# Patient Record
Sex: Male | Born: 1940 | Race: Asian | Hispanic: No | Marital: Married | Smoking: Never smoker
Health system: Southern US, Community
[De-identification: ages and names within clinical notes are randomized; demographics above are authoritative.]

## PROBLEM LIST (undated history)

## (undated) DIAGNOSIS — E785 Hyperlipidemia, unspecified: Secondary | ICD-10-CM

## (undated) DIAGNOSIS — I10 Essential (primary) hypertension: Secondary | ICD-10-CM

## (undated) DIAGNOSIS — M1990 Unspecified osteoarthritis, unspecified site: Secondary | ICD-10-CM

## (undated) DIAGNOSIS — I2510 Atherosclerotic heart disease of native coronary artery without angina pectoris: Secondary | ICD-10-CM

## (undated) HISTORY — PX: CARDIAC CATHETERIZATION: SHX172

## (undated) HISTORY — PX: CORONARY ARTERY BYPASS GRAFT: SHX141

---

## 1996-10-02 HISTORY — PX: OTHER SURGICAL HISTORY: SHX169

## 2005-01-27 ENCOUNTER — Inpatient Hospital Stay (HOSPITAL_COMMUNITY): Admission: EM | Admit: 2005-01-27 | Discharge: 2005-01-31 | Payer: Self-pay

## 2005-06-02 ENCOUNTER — Inpatient Hospital Stay (HOSPITAL_COMMUNITY): Admission: AD | Admit: 2005-06-02 | Discharge: 2005-06-13 | Payer: Self-pay | Admitting: Cardiovascular Disease

## 2005-06-07 ENCOUNTER — Encounter (INDEPENDENT_AMBULATORY_CARE_PROVIDER_SITE_OTHER): Payer: Self-pay | Admitting: Cardiovascular Disease

## 2005-06-26 ENCOUNTER — Encounter: Admission: RE | Admit: 2005-06-26 | Discharge: 2005-06-26 | Payer: Self-pay | Admitting: Cardiovascular Disease

## 2005-07-07 ENCOUNTER — Encounter: Admission: RE | Admit: 2005-07-07 | Discharge: 2005-07-07 | Payer: Self-pay | Admitting: Cardiothoracic Surgery

## 2006-01-12 ENCOUNTER — Ambulatory Visit (HOSPITAL_COMMUNITY): Admission: RE | Admit: 2006-01-12 | Discharge: 2006-01-12 | Payer: Self-pay | Admitting: Orthopedic Surgery

## 2006-08-07 ENCOUNTER — Encounter (HOSPITAL_COMMUNITY): Admission: RE | Admit: 2006-08-07 | Discharge: 2006-08-07 | Payer: Self-pay | Admitting: Cardiovascular Disease

## 2007-06-11 ENCOUNTER — Ambulatory Visit: Payer: Self-pay | Admitting: Vascular Surgery

## 2007-06-11 ENCOUNTER — Encounter (INDEPENDENT_AMBULATORY_CARE_PROVIDER_SITE_OTHER): Payer: Self-pay | Admitting: Endocrinology

## 2007-06-11 ENCOUNTER — Ambulatory Visit (HOSPITAL_COMMUNITY): Admission: RE | Admit: 2007-06-11 | Discharge: 2007-06-11 | Payer: Self-pay | Admitting: Endocrinology

## 2007-07-18 ENCOUNTER — Encounter (INDEPENDENT_AMBULATORY_CARE_PROVIDER_SITE_OTHER): Payer: Self-pay | Admitting: Cardiovascular Disease

## 2007-07-18 ENCOUNTER — Ambulatory Visit (HOSPITAL_COMMUNITY): Admission: RE | Admit: 2007-07-18 | Discharge: 2007-07-18 | Payer: Self-pay | Admitting: Cardiovascular Disease

## 2007-07-18 ENCOUNTER — Ambulatory Visit: Payer: Self-pay | Admitting: Surgery

## 2008-11-26 ENCOUNTER — Encounter: Admission: RE | Admit: 2008-11-26 | Discharge: 2008-11-26 | Payer: Self-pay | Admitting: Cardiovascular Disease

## 2008-11-30 ENCOUNTER — Encounter: Admission: RE | Admit: 2008-11-30 | Discharge: 2008-11-30 | Payer: Self-pay | Admitting: Cardiovascular Disease

## 2008-12-18 ENCOUNTER — Encounter: Admission: RE | Admit: 2008-12-18 | Discharge: 2008-12-18 | Payer: Self-pay | Admitting: Cardiovascular Disease

## 2009-01-13 ENCOUNTER — Inpatient Hospital Stay (HOSPITAL_COMMUNITY): Admission: AD | Admit: 2009-01-13 | Discharge: 2009-01-14 | Payer: Self-pay | Admitting: Cardiovascular Disease

## 2009-04-26 ENCOUNTER — Encounter: Admission: RE | Admit: 2009-04-26 | Discharge: 2009-04-28 | Payer: Self-pay | Admitting: Neurosurgery

## 2009-09-09 ENCOUNTER — Encounter (INDEPENDENT_AMBULATORY_CARE_PROVIDER_SITE_OTHER): Payer: Self-pay | Admitting: Sports Medicine

## 2009-09-09 ENCOUNTER — Ambulatory Visit (HOSPITAL_COMMUNITY): Admission: RE | Admit: 2009-09-09 | Discharge: 2009-09-09 | Payer: Self-pay | Admitting: Sports Medicine

## 2009-09-09 ENCOUNTER — Ambulatory Visit: Payer: Self-pay | Admitting: Vascular Surgery

## 2010-07-27 ENCOUNTER — Ambulatory Visit: Payer: Self-pay | Admitting: Vascular Surgery

## 2010-07-27 ENCOUNTER — Ambulatory Visit (HOSPITAL_COMMUNITY): Admission: RE | Admit: 2010-07-27 | Discharge: 2010-07-27 | Payer: Self-pay | Admitting: Cardiovascular Disease

## 2010-07-27 ENCOUNTER — Encounter (INDEPENDENT_AMBULATORY_CARE_PROVIDER_SITE_OTHER): Payer: Self-pay | Admitting: Cardiovascular Disease

## 2011-01-11 LAB — COMPREHENSIVE METABOLIC PANEL
ALT: 29 U/L (ref 0–53)
AST: 25 U/L (ref 0–37)
Albumin: 3.9 g/dL (ref 3.5–5.2)
Alkaline Phosphatase: 56 U/L (ref 39–117)
BUN: 14 mg/dL (ref 6–23)
CO2: 27 mEq/L (ref 19–32)
Calcium: 9.5 mg/dL (ref 8.4–10.5)
Chloride: 102 mEq/L (ref 96–112)
Creatinine, Ser: 1.1 mg/dL (ref 0.4–1.5)
GFR calc Af Amer: >60 mL/min (ref >60)
GFR calc non Af Amer: >60 mL/min (ref >60)
Glucose, Bld: 174 mg/dL — ABNORMAL HIGH (ref 70–99)
Potassium: 3.7 mEq/L (ref 3.5–5.1)
Sodium: 136 mEq/L (ref 135–145)
Total Bilirubin: 0.6 mg/dL (ref 0.3–1.2)
Total Protein: 6.8 g/dL (ref 6.0–8.3)

## 2011-01-11 LAB — DIFFERENTIAL
Basophils Absolute: 0 10*3/uL (ref 0.0–0.1)
Basophils Relative: 0 % (ref 0–1)
Eosinophils Absolute: 0.8 10*3/uL — ABNORMAL HIGH (ref 0.0–0.7)
Eosinophils Relative: 10 % — ABNORMAL HIGH (ref 0–5)
Lymphocytes Relative: 32 % (ref 12–46)
Lymphs Abs: 2.4 10*3/uL (ref 0.7–4.0)
Monocytes Absolute: 0.6 10*3/uL (ref 0.1–1.0)
Monocytes Relative: 8 % (ref 3–12)
Neutro Abs: 3.8 10*3/uL (ref 1.7–7.7)
Neutrophils Relative %: 50 % (ref 43–77)

## 2011-01-11 LAB — GLUCOSE, CAPILLARY
Glucose-Capillary: 143 mg/dL — ABNORMAL HIGH (ref 70–99)
Glucose-Capillary: 170 mg/dL — ABNORMAL HIGH (ref 70–99)
Glucose-Capillary: 201 mg/dL — ABNORMAL HIGH (ref 70–99)
Glucose-Capillary: 217 mg/dL — ABNORMAL HIGH (ref 70–99)

## 2011-01-11 LAB — CBC
HCT: 39.2 % (ref 39.0–52.0)
HCT: 40.3 % (ref 39.0–52.0)
Hemoglobin: 13.4 g/dL (ref 13.0–17.0)
Hemoglobin: 13.8 g/dL (ref 13.0–17.0)
MCHC: 34.2 g/dL (ref 30.0–36.0)
MCV: 83 fL (ref 78.0–100.0)
MCV: 83.3 fL (ref 78.0–100.0)
Platelets: 238 10*3/uL (ref 150–400)
Platelets: 251 10*3/uL (ref 150–400)
RBC: 4.85 MIL/uL (ref 4.22–5.81)
RDW: 14.9 % (ref 11.5–15.5)
RDW: 14.9 % (ref 11.5–15.5)
WBC: 7.6 10*3/uL (ref 4.0–10.5)

## 2011-01-11 LAB — BASIC METABOLIC PANEL
BUN: 15 mg/dL (ref 6–23)
CO2: 28 mEq/L (ref 19–32)
Chloride: 106 mEq/L (ref 96–112)
GFR calc non Af Amer: >60 mL/min (ref >60)
Glucose, Bld: 106 mg/dL — ABNORMAL HIGH (ref 70–99)
Potassium: 4.1 mEq/L (ref 3.5–5.1)
Sodium: 140 mEq/L (ref 135–145)

## 2011-02-14 NOTE — Consult Note (Signed)
NAME:  TRACIE, LINDBLOOM NO.:  [id number]   MEDICAL RECORD NO.:  [id number]          PATIENT TYPE:  INP   LOCATION:  4705                         FACILITY:  [facility]   PHYSICIAN:  Coletta Memos, M.D.     DATE OF BIRTH:  04-Jan-1941   DATE OF CONSULTATION:  01/14/2009  DATE OF DISCHARGE:  01/14/2009                                 CONSULTATION   ADMITTING DIAGNOSIS:  Possible stroke.   INDICATIONS:  Mr. Weyenberg is a 70 year old gentleman who is in his usual  state of good health when he described having numbness that went across  his face, mouth, neck, shoulder, arm, and forearm on the right side.  He  was seen and evaluated in the emergency room.  It was thought, he may  have had a stroke.  He was therefore admitted and had a stroke  evaluation.  Brain MRIs and CTs have been negative.  He did have a  cervical spine MRI, which showed spondylitic change.  He was seen and  evaluated by Neurology and they felt that maybe he had a possible ulnar  neuropathy or cervical radiculopathy.  He has had no bowel or bladder  dysfunction.  No gait difficulties.  No weakness, nor as he had pain in  his upper extremities.  Mr. Laventure had had previous surgery on his  carotid artery on the right side, but that has been inspected and is  patent.  He otherwise had no other neurologic problems.  He does report  having a pain in his neck.   ALLERGIES:  He has no known drug allergies.   PAST MEDICAL HISTORY:  1. Coronary artery disease.  2. Unstable angina.  3. Type 2 diabetes.  4. Hypertension.  5. Hyperlipidemia.  6. He has had coronary artery bypass graft, which was performed in      2006.   CURRENT MEDICATIONS:  Aspirin, atenolol, calcium carbonate, insulin,  multivitamin, Benicar, and Crestor.   SOCIAL HISTORY:  He has also had angioplasty in the past.  He does not  smoke.  He does not use alcohol.  Does not use illicit drugs.  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  He is married.  He has  one son and one daughter.   FAMILY HISTORY:  Positive for diabetes and myocardial infarction.   PHYSICAL EXAMINATION:  VITAL SIGNS:  He weighs 78.9 kg, height 6 feet 5  inches.  Temperature of 98, pulse 73, respiratory rate 18, and blood  pressure 118/68.  GENERAL:  He is alert and oriented x4 and answering all questions  appropriately.  NEUROLOGIC:  Memory, language, attention span, and fund of knowledge are  normal.  Speech is clear and fluent.  Strength is 5/5 in the upper and  lower extremities.  He has a normal gait.  Negative Romberg test.  Coordination is normal.  Negative Tinel sign over decubital tunnel and  over the carpal tunnel bilaterally.  Reflexes 2+.  Normal muscle, tone,  bulk, and coordination.  Gait is normal.  Hearing intact to finger rub  bilaterally.   Cervical spine MRI shows significant spondylitic disease.  He has facet  arthropathy at C5-6, C6-7, and on the right side foraminal narrowing, on  the left side only at C6-7.  Cord signal is normal.  He has no cord  compression at any level.  Foraminal narrowing present at C5-6 and some  at C6-7.   DIAGNOSES:  Numbness across face and arm.   On examination, proprioception is normal and light touch is normal in  the face and all distributions of the trigeminal nerve.  I am not sure,  which causing the numbness, but certainly does not need an operation.  He does not have a radicular history nor does he have radicular  problems.  Mr. Raineri can certainly be discharged from my standpoint.  He does not need follow up as a result of his neck, only if he has  increasing problems.  I told Dr. [name] to try some anti-  inflammatories, will see how he does, but he will be discharged today.           ______________________________  Coletta Memos, M.D.     KC/MEDQ  D:  01/14/2009  T:  01/15/2009  Job:  [id number]

## 2011-02-14 NOTE — Discharge Summary (Signed)
NAME:  FERDINANDO, LODGE NO.:  [id number]   MEDICAL RECORD NO.:  [id number]          PATIENT TYPE:  INP   LOCATION:  4705                         FACILITY:  [facility]   PHYSICIAN:  Ricki Rodriguez, M.D.  DATE OF BIRTH:  1941/08/18   DATE OF ADMISSION:  01/13/2009  DATE OF DISCHARGE:  01/14/2009                               DISCHARGE SUMMARY   FINAL DIAGNOSES:  1. Cervical disk disease with radiculopathy.  2. Diabetes mellitus type 2.  3. Hypertension.  4. Coronary arthrosclerosis of native coronary vessels.  5. Aortocoronary artery bypass status.  6. Hyperlipidemia.  7. Long-term use of aspirin.   DISCHARGE MEDICATIONS:  1. Aspirin 81 mg one daily.  2. Plavix 75 mg one daily.  3. Lopressor 50 mg twice daily.  4. Vytorin 10/40 mg one in the evening.  5. Multivitamin one daily.  6. Actoplus Met 15/500 mg one twice daily.  7. Calcium 600 mg daily.  8. NovoLog 70/30 - 25 units in the morning and 50 units in the      evening.  9. Diovan 160 mg daily.  10.Lasix 40 mg daily.  11.K-Dur 20 mEq daily.  12.Diclofenac 50 mg one twice daily for 2 to 4 weeks.   DISCHARGE DIET:  Low-sodium, heart-healthy diet, low-calorie  carbohydrate modified diet.   DISCHARGE ACTIVITY:  The patient to increase activity slowly.   FOLLOWUP:  Follow up by Dr. Orpah Cobb in 1 week.  The patient to call  at [phone] for appointment.   HISTORY:  This 70 year old Asian-American male complained of right hand  and facial numbness x 1 month.  The patient denied any chest pain,  fever, nausea, and vomiting.  He has history of chronic neck pain,  occasional headaches without visual disturbance.   PHYSICAL EXAMINATION:  VITAL SIGNS:  Pulse 74, respirations 14, blood  pressure 180/80, height 5 feet 5 inches, and weight 180 pounds.  HEENT:  The patient is normocephalic and atraumatic with frontal  baldness, brown eyes, and bilateral lens implant.  NECK:  No JVD with 50% range of motion.  LUNGS:  Clear.  HEART:  Normal S1 and S2.  EXTREMITIES:  No edema.  CNS:  Cranial nerves grossly intact.  Bilateral equal grips.  Right  upper extremity numbness.   LABORATORY DATA:  Normal hemoglobin, hematocrit, WBC count, and platelet  count.  Normal electrolytes, BUN, creatinine, glucose 174.  Subsequent  glucose ranged between 106 and 217.  MRI and MRA of the head showed no  evidence of occlusion or correctable stenosis of the cerebral vessels.  No acute or subacute infarction.  X-ray of the chest showed stable  postoperative chest with chronic right middle lobe scarring.   HOSPITAL COURSE:  The patient was admitted to telemetry unit.  He had  Neurosurgical consult for a cervical disk disease and radiculopathy.  The patient was started on NSAID and outpatient followup was arranged  for his further treatment of cervical disk disease with radiculopathy.  The patient was discharged home in satisfactory condition with follow up  by me and by Neurosurgeon as arranged.  Ricki Rodriguez, M.D.  Electronically Signed     ASK/MEDQ  D:  03/18/2009  T:  03/19/2009  Job:  [id number]

## 2011-02-14 NOTE — Consult Note (Signed)
NAME:  Calvin Perez, Calvin Perez NO.:  [id number]   MEDICAL RECORD NO.:  [id number]          PATIENT TYPE:  INP   LOCATION:  4705                         FACILITY:  [facility]   PHYSICIAN:  Levert Feinstein, MD          DATE OF BIRTH:  [birth year]   DATE OF CONSULTATION:  DATE OF DISCHARGE:  01/14/2009                                 CONSULTATION   REFERRING PHYSICIAN:  Ricki Rodriguez, M.D.   CHIEF COMPLAINT:  This is an urgent consult from Dr. Orpah Cobb for  right facial, arm numbness.   HISTORY OF PRESENT ILLNESS:  The patient is a 70 year old right-handed  [place] male, with past medical history of coronary artery disease status  post CABG in September 2006, type 2 diabetes, hypertension,  hyperlipidemia, right carotid endarterectomy in the past.  He presents  with couple of months history of right fifth finger numbness,  intermittent, gradually spreading to the right medial forearm, to the  armpit area, still intermittent, recent intermittent involvement of  right neck, jaw, and the right lip.   He denied neck pain otherwise, and denied dysarthria, dysphagia, or limb  weakness.   He was admitted for further evaluation.  CT of the head without contrast  demonstrated no acute change, but there was periventricular white matter  disease.  MRI of the spine was done on March 19 this year, which has  demonstrated a multilevel degenerative disk disease, with mild canal  stenosis at C3-4, C4-5 level.  There was also C6 and C7 spondylosis,  right posterolateral disk herniation, and foraminal stenosis  bilaterally, worse on the right side.   REVIEW OF SYSTEMS:  Pertinent as above.   PAST MEDICAL HISTORY:  1. Coronary artery disease.  2. Type 2 diabetes.  3. Hypertension.  4. Hyperlipidemia.  5. Right endarterectomy.   FAMILY HISTORY:  Noncontributory.   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   CURRENT MEDICATIONS:  Aspirin, atenolol, insulin,  multivitamin, Benicar,  and Crestor.   ALLERGY:  No known drug allergy.   PHYSICAL EXAMINATION:  VITAL SIGNS:  Temperature 97.9, blood pressure  125/64, heart rate of 78, and respiration of 20.  CARDIAC:  Regular rate and rhythm.  PULMONARY:  Clear to auscultation bilaterally.  NECK:  Supple.  No carotid bruits.  NEUROLOGIC:  Awake the patient from sleep, he is slow to response, but  there was no dysarthria and no aphasia.   Cranial nerves II through XII were normal.  Motor examination, Normal  tone, bulk, and strength.  Sensory, normal to light touch, pinprick,  vibratory sensation.  Deep tendon reflex present and symmetric.  Plantar  responses were flexor.  Coordination; normal finger-to-nose, heel-to-  shin.   Gait was deferred.   ASSESSMENT:  A 70 year old gentleman, presenting with constellation of  complaints, certainly has multiple vascular risk factors, including  hypertension, hyperlipidemia, diabetes, and coronary artery disease.  The intermittent, prolonged history, less suggestive of stroke, however  with a facial involvement, need to rule out central nervous system  etiology.   PLAN:  1.  MRI of the brain without contrast.  2. The other potential differentiation diagnosis including mild right      ulnar neuropathy, versus cervical radiculopathy, can complete rest      evaluation as an outpatient, please followup with clinic one month      postdischarge.      Levert Feinstein, MD  Electronically Signed     YY/MEDQ  D:  01/14/2009  T:  01/14/2009  Job:  [id number]

## 2011-02-17 NOTE — Cardiovascular Report (Signed)
Calvin Perez, Calvin Perez NO.:  [id number]   MEDICAL RECORD NO.:  [id number]          PATIENT TYPE:  OIB   LOCATION:  2899                         FACILITY:  [facility]   PHYSICIAN:  Ricki Rodriguez, M.D.  DATE OF BIRTH:  September 25, 1941   DATE OF PROCEDURE:  06/02/2005  DATE OF DISCHARGE:                              CARDIAC CATHETERIZATION   PROCEDURE:  Left heart catheterization, selective coronary angiography, left  ventricular function study.   INDICATIONS:  This is a 70 year old diabetic, hypertensive, Asian-American  with recurrent chest pain along with coronary artery disease, stent  placement in LAD, and history of hyperlipidemia.   APPROACH:  Right femoral artery using 4 French sheath and catheters.   COMPLICATIONS:  None.   HEMODYNAMIC DATA:  The left ventricle pressure was 123/17 and aortic  pressure was 135/62.   LEFT VENTRICULOGRAM:  The left ventriculogram showed preserved left  ventricular systolic function with ejection fraction of 60%.   CORONARY ANATOMY:  1.  The left main coronary artery showed ostial eccentric 30-40% stenosis      and distal 50% stenosis with calcification.  2.  Left anterior descending coronary artery:  The left anterior descending      coronary artery showed ostial 30-40% stenosis with calcification and a      long 40-50% narrowing, tapering into 70-80% lesion just before the      proximal end of the LAD stent.  The mid-LAD stent was patent and the      distal LAD had a mild disease near the apex of the heart.  The diagonal      1 was a very small vessel, diagonal 2 had ostial 70% stenosis, diagonal      3, 4 and 5 were small vessels.  3.  Left circumflex coronary artery:  The left circumflex coronary artery      had ostial 60-70% stenosis and proximal 50-60% stenosis, plus obtuse      marginal branch 1 showing a small artery with diffuse disease.  Obtuse      marginal branch 2 was a larger vessel with proximal 80-90% eccentric      lesion.  The rest of the vessel was unremarkable.  4.  Right coronary artery:  The right coronary artery was dominant, had      proximal luminal irregularities, midvessel 30+ plus luminal      irregularities and continued as posterolateral branch, which split into      four branches, the superior branch showing a diffuse narrowing and the      rest of the three branches were unremarkable.  The superior branch      received a collateral vessel from left anterior descending coronary      artery.  Large marginal branch of the right coronary artery showed      ostial 50-60% stenosis and a midvessel 80-90% stenosis.  It then      continued as a posterior descending coronary artery.  5.  Left internal mammary artery injection:  The left internal mammary      artery was patent.   IMPRESSION:  1.  Multivessel  native vessel coronary artery disease.  2.  Patent left internal mammary artery.  3.  Patent stent in left anterior descending coronary artery.  4.  Preserved left ventricular systolic function.   RECOMMENDATIONS:  This patient will undergo coronary artery bypass graft  surgery by cardiovascular-thoracic surgeons.      Ricki Rodriguez, M.D.  Electronically Signed     ASK/MEDQ  D:  06/02/2005  T:  06/02/2005  Job:  [id number]

## 2011-02-17 NOTE — Discharge Summary (Signed)
NAMEKAVI, ALMQUIST NO.:  [id number]   MEDICAL RECORD NO.:  [id number]          PATIENT TYPE:  INP   LOCATION:  5704                         FACILITY:  [facility]   PHYSICIAN:  Michaelyn Barter, M.D. DATE OF BIRTH:  [birth year]   DATE OF ADMISSION:  01/27/2005  DATE OF DISCHARGE:  01/31/2005                                 DISCHARGE SUMMARY   PRIMARY CARE PHYSICIAN:  Dr. Teena Irani. Arlyce Dice.   FINAL DIAGNOSES AT TIME OF DISCHARGE:  1.  Facial cellulitis.  2.  Diabetes mellitus, uncontrolled.  3.  Hypertension, uncontrolled.   HISTORY OF PRESENT ILLNESS:  Mr. Stailey is a 70 year old gentleman with a  past medical history of diabetes mellitus and hypertension, who arrived at  the hospital with a chief complaint of pain, redness and swelling in his  face.  He stated that his symptoms had begun approximately 2 days prior to  his admission.  He had seen his primary care physician approximately 1 week  prior to this admission for evaluation of abdominal discomfort and diarrhea.  At that time, he was noted to have an inflammatory lesion on his face and  was started on Augmentin on January 25, 2005.  His facial/inflammatory  symptoms progressed and he developed redness and induration of both the  malar regions of his face including his nose.  His primary care physician  therefore referred him to the hospital for further evaluation.   PAST MEDICAL HISTORY:  1.  Diabetes mellitus, type 2.  2.  Hypertension.  3.  Dyslipidemia.  4.  Coronary artery disease, status post PTCA with stent placement in 1998.   ALLERGIES:  No known drug allergies.   SOCIAL HISTORY:  The patient denies cigarettes.  The patient denied alcohol.   HOSPITAL COURSE:  PROBLEM #1 - FACIAL CELLULITIS:  The patient was admitted  into the hospital forfurther evaluation of his facial cellulitis.  He was  subsequently started on vancomycin IV, and blood cultures were drawn. The  blood cultures were negative  x2.  Over the course of his hospitalization,  his face felt better.  The swelling, erythema, and pain decreased.  By  approximately 4 days into his hospitalization, he stated that his face had  improved 80%, and stated he was ready to go home.   PROBLEM #2 - DIABETES MELLITUS:  Over the course of the patient's  hospitalization, his sugars remained slightly elevated.  Attempts were made  to try to better control the patient's glucose; however, he will have to  follow up with his primary care physician for optimal control.   PROBLEM #3 - HYPERTENSION:  Over the course of the patient's  hospitalization, the patient's blood pressure was normotensive to just  slightly hypertensive.  Again, he can follow up with his primary care  physician for further control of his blood pressure.   The patient's condition at the time of discharge was improved.  Again, he  stated that he felt much better and stated that his face had shown  approximately 80% improvement.  He had no nausea, vomiting, fevers, or  chills on the date of  his discharge and he stated that he was ready to go  home.   Vitals at the time of discharge:  His temperature was 99.3, heart rate 90,  respirations 18, blood pressure 144/82 and he saturated 97% on room air.  His CBGs, however, were elevated with one being 152 and an earlier one being  250; the decision was made, however, to discharge the patient home.   DISCHARGE MEDICATIONS:  The patient was discharged home on the following  medications:  1.  Zetia 10 mg p.o. daily.  2.  Aspirin 81 mg p.o. daily.  3.  Lisinopril 20 mg p.o. daily.  4.  Zocor 20 mg p.o. daily.  5.  Darvocet one tablet q.12 h. p.r.n.  6.  Cephalexin 500 mg one tablet p.o. q.6 h.   DISCHARGE INSTRUCTIONS:  He was instructed to continue all of his other  diabetic home medications as previous and to follow up with Dr. Dara Hoyer  for further evaluation within 1-2 weeks.       OR/MEDQ  D:  03/18/2005   T:  03/20/2005  Job:  [id number]   cc:   Teena Irani. Arlyce Dice, M.D.  [address]  [place]  [place] [address]  Fax: [phone]

## 2011-02-17 NOTE — Discharge Summary (Signed)
Calvin Perez, GALYEAN NO.:  [id number]   MEDICAL RECORD NO.:  [id number]          PATIENT TYPE:  INP   LOCATION:  2025                         FACILITY:  [facility]   PHYSICIAN:  Shan Levans, M.D. LHCDATE OF BIRTH:  [birth year]   DATE OF ADMISSION:  06/02/2005  DATE OF DISCHARGE:  06/13/2005                                 DISCHARGE SUMMARY   PRIMARY ADMITTING DIAGNOSIS:  Chest pain.   DISCHARGE DIAGNOSES:  1.  Coronary artery disease.  2.  Unstable angina.  3.  Type 2 insulin-dependent diabetes mellitus.  4.  Hypertension.  5.  Hyperlipidemia.  6.  History of right carotid endarterectomy in the past.   PROCEDURES PERFORMED:  1.  Cardiac catheterization.  2.  Coronary artery bypass grafting x4 (left internal mammary artery to the      LAD, saphenous vein graft to the diagonal, saphenous vein graft to the      obtuse marginal, saphenous vein graft to the posterior descending).  3.  Endoscopic vein harvest right lower extremity.  4.  Placement of right femoral A-line.   HISTORY:  The patient is a 70 year old male with a history of coronary  artery disease status post PTCA of the LAD in 1998. He was recently seen by  Dr. Algie Perez for evaluation of exertional chest pain. Because of his previous  history of coronary artery disease, he was brought in for outpatient cardiac  catheterization on June 02, 2005. This demonstrated severe three-vessel  coronary artery disease with an ejection fraction of 45-50% with inferior  wall hypokinesia. He was found to have a distal 50% left main stenosis, a 70-  80% proximal LAD, 60-70% ostial circumflex, 50-60% proximal OM1, 80-90% OM2,  30% mid RCA with 50-60% ostial marginal and 80-90% mid vessel lesion.  Because of significant disease, he was not felt to be candidate for further  percutaneous intervention. Therefore, he was admitted for cardiac surgery  workup.   HOSPITAL COURSE:  He was admitted following his cardiac  catheterization. He  was seen in consultation by Dr. Kathlee Nations Tright and his films were  reviewed. Dr. Morton Peters agreed that his best course of action would be to  proceed with surgical revascularization. He explained the risks, benefits  and alternatives of the procedure to the patient and he did agree to  surgery. The patient had been given Plavix around the time of his  catheterization and because of this it was felt that surgery should be  delayed for at least 72 hours in order to allow the Plavix to wash out of  his system. In the interim, he did have an episode of chest pain which  required nitroglycerin drip. He was also started on heparin in the interim.  He remained stable and had no further chest pain preoperatively. He was  taken to the operating room on June 06, 2005 and underwent CABG x4 as  described in detail above. He tolerated the procedure well and was  transferred to the SICU in stable condition. He was initially too sedated to  be weaned from the ventilator but was able to be  extubated by postoperative  day #1. Initially, postoperatively he was hemodynamically labile and  required Neo-Synephrine drip, dobutamine, and dopamine drips. He was kept in  the ICU for further management and the nitroglycerin and dobutamine were  slowly weaned and discontinued. He did continue to require dopamine drip for  hypotension. By postoperative day #2, his dopamine was also weaned and  discontinued. He remained stable in ICU for an additional 24 hours  observation.   By June 09, 2005, he was hemodynamically stable and off all drips. He  was started on low-dose beta blocker and diuretic. He was also restarted on  Plavix for his history of unstable angina. He was able to be transferred to  the floor later that day. Postoperatively, his diabetes was managed with  Lantus insulin and Glucomander insulin protocol until he was able to take  p.o.'s. At that time, his home dose of  70/30 was restarted. His BUN and  creatinine have remained stable and he is now been restarted on his home  dose of Actoplus Met as well. His blood sugars have remained fairly stable  on his home medications. Presently, his CBG's are running from the 70s to  180s but have remained somewhere right around 100. Since his transfer to the  floor, he has been ambulating well with cardiac rehabilitation phase one as  well as independently. His surgical incision sites are healing well. He has  been quite volume overloaded and has been started on Lasix for diuresis and  appears to be diuresing well. He was still approximately 10 pounds above his  preoperative weight, however, and his lower extremities showed some mild  edema. He was tolerating a regular diet, has remained afebrile, and all  vital signs have been stable. His most recent labs on June 12, 2005  showed a sodium of 132, potassium 4.9, BUN 18, creatinine 1.3. Also  hemoglobin of 9.4, hematocrit 27.4, white count 6.9, platelets 199,000. His  most recent chest x-ray showed bibasilar atelectasis but otherwise clear. He  has continued to make progress and it is felt that if he remained stable  over the next 24 hours, he will hopefully be ready for discharge home on  June 13, 2005.   DISCHARGE MEDICATIONS:  1.  Enteric-coated aspirin 81 milligrams q.d.  2.  Plavix 75 milligrams q.d.  3.  Lopressor 50 milligrams b.i.d.  4.  Vytorin 10/40 mg q.h.s.  5.  Multivitamin q.d.  6.  Actoplus Met 15/500 milligrams b.i.d.  7.  Calcium 600 milligrams 2 tablets q.d.  8.  NovoLog 70/30 25 units q.a.m. and 20 units q.p.m.  9.  Diovan 160 milligrams q.d.  10. Lasix 40 milligrams q.d. x1 week.  11. K-Dur 20 mEq q.d. x1 week.  12. Tylox one to two q.4h. p.r.n. for pain.   DISCHARGE INSTRUCTIONS:  He is asked to refrain from driving, heavy lifting  or strenuous activity. He may continue ambulating daily and using his incentive spirometer. He  may shower daily and clean his incisions with soap  and water. He will continue his same preoperative diet with low fat, low  sodium, with diabetic restrictions.   DISCHARGE FOLLOWUP:  He is asked to make an appointment see Dr. Algie Perez in  two weeks. He will see Dr. Morton Peters on July 07, 2005 at 12 p.m. He will  have a chest x-ray one hour prior to this appointment at [facility] and should bring his films to the office for Dr. Morton Peters  to  review. In the interim, if he experiences any problems or has questions, he  is asked to contact our office immediately.      Coral Ceo, P.A.      Shan Levans, M.D. [facility]  Electronically Signed    GC/MEDQ  D:  06/12/2005  T:  06/13/2005  Job:  [id number]   cc:   Ricki Rodriguez, M.D.  Fax: [phone]   CVTS Office   Reather Littler, M.D.  [address], [address]  [place]  [place] [address]  Fax: [phone]

## 2011-02-17 NOTE — Op Note (Signed)
NAME:  Calvin Perez, [name] NO.:  [id number]   MEDICAL RECORD NO.:  [id number]          PATIENT TYPE:  INP   LOCATION:  2304                         FACILITY:  [facility]   PHYSICIAN:  Kerin Perna, M.D.  DATE OF BIRTH:  May 07, 1941   DATE OF PROCEDURE:  06/06/2005  DATE OF DISCHARGE:                                 OPERATIVE REPORT   OPERATION:  Coronary artery bypass grafting x4 (left internal mammary artery  LAD, saphenous vein graft to diagonal, saphenous vein graft to obtuse  marginal, saphenous vein graft to posterior descending) and placement of  right femoral artery A-line.   SURGEON:  Kerin Perna, M.D.   ASSISTANT:  Pecola Leisure, P.A.-C.   ANESTHESIA:  General.   INDICATIONS FOR PROCEDURE:  The patient is a 70 year old diabetic who  presented with unstable angina. Cardiac catheterization by Dr. [name]  demonstrated severe three-vessel coronary disease with ejection fraction of  45-50% with inferior wall hypokinesia. He was felt to be candidate for  surgical evaluation. Prior to surgery, I examined the patient in his  hospital room and reviewed the results of a cardiac catheterization with the  patient and wife. I discussed the indications and expected benefits of  coronary artery bypass surgery for treatment of his coronary disease. I  reviewed the major aspects of the planned procedure including the choice of  conduit to include mammary artery and endoscopically harvested saphenous  vein, the location of the surgical incisions, use of general anesthesia and  cardiopulmonary bypass, and the expected postoperative hospital recovery. I  discussed with the patient the risks to him of coronary bypass surgery  including the risks of MI, CVA, bleeding, infection, and death. He  understood these implications for the surgery and agreed to proceed with  operation as planned under what I felt was an informed consent. We delayed  his surgery for 72 hours to  allow time for preoperative Plavix to resolve.   OPERATIVE FINDINGS:  The patient had adequate vein which was endoscopically  harvested from the right leg. The mammary artery was a good conduit. The  coronary vessels were heavily calcified, diseased and small. The heart was  dilated and generally hypocontractile when the heart was initially exposed.  The patient received 2 units of packed cells for a hemoglobin of 9 grams  while on cardiopulmonary bypass. The patient was given the aprotinin  protocol for this operation. After placement of the bypass grafts, the heart  global function showed improvement. There is some mild inferior wall  scarring on inspection of the heart muscle.   DESCRIPTION OF PROCEDURE:  The patient was brought to the operating room and  placed supine on the operating room table, general anesthesia was induced  under invasive hemodynamic monitoring. The chest, abdomen and legs were  prepped with Betadine and draped as a sterile field. A sternal incision was  made, the saphenous vein was harvested endoscopically. The left internal  mammary artery was harvested as a pedicle graft from its origin at the  subclavian vessels and was a good vessel with excellent flow. Heparin was  administered and the  ACT was documented as being therapeutic. The sternal  retractor was placed. The pericardium was opened and suspended. Pursestrings  were placed in the ascending aorta and right atrium and the patient was  cannulated and placed on bypass. The coronaries were identified for grafting  and the mammary artery and vein grafts were prepared for the distal  anastomoses. Cardioplegia catheters were placed for both antegrade aortic  and retrograde coronary sinus cardioplegia. The patient was cooled to 32  degrees. The aortic crossclamp was applied. 800 mL  of cold blood  cardioplegia was delivered in split doses between the antegrade aortic and  retrograde coronary sinus catheters.  There was good cardioplegic arrest.  Septal temperature dropped less than 12 degrees. Topical ice saline was used  to augment myocardial preservation and a pericardial insulator pad was used  to protect left phrenic nerve.   The distal coronary anastomoses were performed. The first distal anastomosis  was to the posterior descending branch of the right coronary. This was  heavily diseased and calcified and the bypass was anastomosed to the distal  aspect of the vessel which was 1.5 mm in diameter. The reverse saphenous  vein was sewn end-to-side with running 7-0 Prolene with good flow through  this graft. The second distal anastomosis was with the diagonal. The  proximal diagonal was heavily calcified and not graftable and the bypass  graft was placed distally. A reverse saphenous vein was sewn to this 1.5 mm  vessel mm with running 7-0 Prolene, there was good flow through the graft.  The cardioplegia was redosed. The third distal anastomosis was the  circumflex marginal. There was a larger 1.7 mm vessel with a proximal 90%  stenosis. The reverse saphenous vein was sewn end-to-side with running 7-0  Prolene, there was good flow through graft. Cardioplegia was redosed. The  fourth distal anastomosis was of the mid portion of the LAD after the  diagonal bifurcation. Here was a 1.7 mm vessel with proximal 90% stenosis.  The left internal mammary artery pedicle was brought through an opening  created in the left lateral pericardium and was brought down on the LAD and  sewn end-to-side with running 8-0 Prolene. There was excellent flow through  the anastomosis after briefly releasing the pedicle clamp on the mammary  vessel. The vascular bulldog was reapplied and the pedicle was secured to  the epicardium.   Cardioplegia was redosed. While the crossclamp was still in place, three  proximal vein anastomoses were placed on the ascending aorta using a 4.0 mm punch and__________ running 6-0  Prolene. Prior to releasing the crossclamp  and tying the final proximal anastomosis, air was vented from the coronaries  and the left side of the heart using a dose of retrograde warm blood  cardioplegia and the usual de-airing maneuvers on bypass. The crossclamp was  then removed as the final proximal anastomosis was tied.   The heart resumed a spontaneous rhythm. Air was aspirated from the vein  grafts using a 27 gauge needle and the vein grafts were opened and perfused.  The cardioplegia catheters were removed. Hemostasis was documented at the  proximal and distal anastomoses. The patient was rewarmed to 37 degrees.  Temporary pacing wires were applied. The lungs were re-expanded and the  ventilator was resumed. The patient was weaned from bypass on renal dose  dopamine being AV sequentially paced. Blood pressure and cardiac output were  stable. Protamine was administered. The radial arterial waveform became  dampened and a femoral  artery catheter was placed in the right femoral  artery and secured to the skin. This provided a significant improvement in  waveform. Protamine was administered without adverse reaction. The cannulas  were removed. The mediastinum was irrigated with warm antibiotic irrigation.  The leg incision was irrigated and closed in a standard fashion. The  superior pericardial fat was closed over the aorta and vein grafts. Two  mediastinal and a left pleural chest tube were placed and brought out  through separate incisions.  The sternum was closed with interrupted steel wire. The pectoralis fascia  was closed with a running #1 Vicryl. The subcutaneous and skin layers were  closed with a running Vicryl and sterile dressings were applied. Total  bypass time was 130 minutes with a crossclamp time of 80 minutes.      Kerin Perna, M.D.  Electronically Signed     PV/MEDQ  D:  06/06/2005  T:  06/06/2005  Job:  [id number]   cc:   Ricki Rodriguez, M.D.  [address] [place]  [place] [address]  Fax: [phone]

## 2011-02-17 NOTE — H&P (Signed)
NAMEGARRETTE, CAINE NO.:  [id number]   MEDICAL RECORD NO.:  [id number]          PATIENT TYPE:  OIB   LOCATION:  6523                         FACILITY:  [facility]   PHYSICIAN:  Ricki Rodriguez, M.D.  DATE OF BIRTH:  01-Dec-1940   DATE OF ADMISSION:  06/02/2005  DATE OF DISCHARGE:                                HISTORY & PHYSICAL   CHIEF COMPLAINT:  Chest pain.   HISTORY OF PRESENT ILLNESS:  This 70 year old Asian-American male complains  of chest pain with activity for the last one month.  He had angioplasty of  the LAD done in 1998 in [place] and had carotid stenting in 2000.  Today he underwent cardiac catheterization that showed three vessel  significant disease.  He is being admitted for evaluation by CVTS for  possible coronary artery bypass graft surgery in near future.   PAST MEDICAL HISTORY:  Positive for diabetes for 30 years, hypertension for  3 years.  No history of smoking, alcohol intake, or drug use.  Positive  history of elevated cholesterol level.  No history of myocardial infarction,  obesity, exercise, or family history of premature coronary artery disease.   PAST SURGICAL HISTORY:  Patient had angioplasty of LAD in 1998 and right  carotid stenting in 2000, cataract surgery in 2000 and 2002.   ALLERGIES:  None.   MEDICATIONS:  1.  Diovan 160 mg one daily.  2.  Atenolol 25 mg one daily.  3.  Aspirin 325 mg one daily.  4.  Centrum multivitamin one daily.  5.  Calcium 600 mg two daily.  6.  ACTOplus MET 15/500 one daily.  7.  Crestor 5 mg one daily.  8.  Vytorin 10/40 one daily.  9.  Norvasc 2.5 mg one daily.  10. Novolog 70/30 25 units in the morning and 20 units in the evening.   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 His wife is 77  years old. he has one son and one daughter.   FAMILY HISTORY:  Mother died of diabetic complications at age 54.  Father  died of myocardial infarction at age 82.  He has two  brothers - one died of  car accident in [place] and has two sisters living - one in [place] has  diabetes mellitus type 2.   REVIEW OF SYSTEMS:  Positive weight loss of 10 pounds in one year.  Positive  vision change with patient wearing driving glasses.  Positive cataract  surgery.  Negative hearing loss, negative tinnitus, negative rhinorrhea,  negative dentures, negative cough, hemoptysis, asthma, COPD, pneumonia,  dyspnea, nausea, vomiting, diarrhea, GI bleed, ulcer, hernia, hepatitis or  blood transfusion.  Negative history of kidney stone, dysuria, stroke,  seizures, or psychiatric admissions.   PHYSICAL EXAMINATION:  VITAL SIGNS:  Pulse 70, respirations 14, blood  pressure 150/80, height 5 feet 4 inches, weight 163 pounds.  GENERAL:  Patient is alert and oriented x3.  HEAD:  Normocephalic, atraumatic with frontal baldness.  EYES:  [name] with pupils reacting to light.  Extraocular movements intact.  EARS/NOSE/THROAT:  Mucous membranes pink  and moist.  NECK:  No JVD.  No carotid bruit.  LUNGS:  Clear bilaterally.  HEART:  Normal S1-S2.  ABDOMEN:  Soft and nontender.  EXTREMITY:  No edema, cyanosis, or clubbing.  CNS:  Cranial nerves grossly intact and bilateral equal grips.   LABORATORY DATA:  Hemoglobin of 13.5, hematocrit of 39.9, normal WBC count  and platelet count.  Normal electrolytes, BUN, creatinine, sugar elevated at  173.  EKG revealed normal sinus rhythm.   ASSESSMENT:  1.  Unstable angina.  2.  Hypertension.  3.  Diabetes mellitus type 2.  4.  Multivessel native vessel coronary artery disease.   PLAN:  Plan is to admit patient to telemetry unit, start home medications  and start IV heparin and CVTS consult.      Ricki Rodriguez, M.D.  Electronically Signed     ASK/MEDQ  D:  06/02/2005  T:  06/02/2005  Job:  [id number]

## 2011-02-17 NOTE — H&P (Signed)
Calvin Perez, Calvin Perez NO.:  [id number]   MEDICAL RECORD NO.:  [id number]          PATIENT TYPE:  INP   LOCATION:  5704                         FACILITY:  [facility]   PHYSICIAN:  Isidor Holts, M.D.  DATE OF BIRTH:  12-03-1940   DATE OF ADMISSION:  01/27/2005  DATE OF DISCHARGE:                                HISTORY & PHYSICAL   PRIMARY CARE PHYSICIAN:  Teena Irani. Arlyce Dice, M.D.   CHIEF COMPLAINT:  Pain, redness, swelling in the face approximately two  days.   HISTORY OF PRESENT ILLNESS:  This is a 70 year old male, with known history  of diabetes mellitus, dyslipidemia, hypertension, coronary artery disease.  According to the patient, approximately one week ago, he developed abdominal  discomfort and diarrhea about three to four times a day. Denies vomiting.  After three to four days of these symptoms, he went to see his primary care  physician who thought he had a virus. However, at the time, the primary  care physician noticed an inflammatory lesion on the face near the left eye  and commenced the patient on Augmentin on January 25, 2005. The inflammatory  phenomenon had progressed with redness and induration of both malar regions  and the nasal bridge.   Primary care physician saw the patient on January 22, 2005, got concerned, and  referred him for admission.   PAST MEDICAL HISTORY:  1.  Diabetes mellitus type 2.  2.  Hypertension.  3.  Dyslipidemia.  4.  Coronary artery disease, status post PTCA and stent in 1998.   MEDICATIONS:  1.  Unknown antihypertensive medications.  2.  Glucophage 500 mg p.o. daily.  3.  Lantus 40 units subcutaneous q.h.s.  4.  Vytorin (query dosage) 1 pill p.o. daily.   ALLERGIES:  No known drug allergies.   REVIEW OF SYMPTOMS:  Essentially as per HPI and chief complaint. Admits  fever on and off and chills. GI symptoms have subsided. System review is  otherwise negative.   SOCIAL HISTORY:  The patient is married. He has two  offsprings with the  former alive and well. Nonsmoker and nondrinker. Has no history of drug  abuse. His father has coronary artery disease. His mother is diabetic.   FAMILY HISTORY:  Otherwise noncontributory.   PHYSICAL EXAMINATION:  VITAL SIGNS:  Temperature 97.8, pulse 101, blood  pressure initially 159/91, (rechecked 166/82 mmHg), respiratory rate 26,  pulse oximeter 97% on room air.  GENERAL:  The patient appears quite comfortable. No obvious acute distress.  No short of breath at rest. Communicative.  HEENT:  The patient has erythema/induration of both cheeks and nasal bridge,  more on the left than the right extending to the lower margin of left lower  palpebra. No clinical pallor. No jaundice. No conjunctival injection or eye  discharge. The patient has extensive crusting present on nasal bridge.  Throat appears quite clear.  NECK:  Supple. JVP not seen. No palpable lymphadenopathy. No palpable  goiter. No carotid bruits.  CHEST:  Clinically clear to auscultation. No wheezes and no crackles.  HEART:  Heart sounds were heard. Normal regular. No murmurs.  ABDOMEN:  Full, soft, and nontender. There is no palpable organomegaly. No  palpable masses. Normal bowel sounds.  EXTREMITIES:  No pitting edema. Palpable peripheral pulses.  MUSCULOSKELETAL:  No obvious abnormalities.  NEUROLOGICAL:  No focal neurologic deficits on gross examination.   LABORATORY DATA:  CBC shows WBC 11.4, hemoglobin 11.6, hematocrit 34.0,  platelets 388,000. Electrolytes show sodium 128, potassium 4.7, chloride 98,  CO2 22, BUN 13, creatinine 1.1. Glucose 292, AST 30, ALT 26, alkaline  phosphate 52.   ASSESSMENT/PLAN:  1.  Facial cellulitis with impetiginization. Appears quite extensive. No      appreciable improvement on Augmentin. Agree with vancomycin treatment      and we will continue this. We should also do blood cultures. If there is      no real improvement in the next 24 to 48 hours, we may need  to involve      ID and consider ENT input. Meanwhile, we will monitor the situation      closely.   1.  Diabetes mellitus:  Query control. Certainly, the patient is      hyperglycemic possibly secondary to infection. We will manage with diet,      sliding scale insulin, and scheduled Lantus.   1.  Hyponatremia:  Likely pseudohyponatremia secondary to #2 above. We shall      monitor.   1.  Hypertension, not controlled. We will commence ACE inhibitor.   1.  History of coronary artery disease, asymptomatic:  We will add aspirin      treatment.  Further management will depend on clinical course.      CO/MEDQ  D:  01/27/2005  T:  01/28/2005  Job:  [id number]   cc:   Teena Irani. Arlyce Dice, M.D.  [address]  [place]  [place] [address]  Fax: [phone]

## 2012-01-26 ENCOUNTER — Encounter (HOSPITAL_COMMUNITY): Payer: Self-pay | Admitting: *Deleted

## 2012-01-26 ENCOUNTER — Emergency Department (HOSPITAL_COMMUNITY)
Admission: EM | Admit: 2012-01-26 | Discharge: 2012-01-26 | Disposition: A | Discharge status: Home / Self Care | Payer: Medicare Other | Attending: Emergency Medicine | Admitting: Emergency Medicine

## 2012-01-26 ENCOUNTER — Emergency Department (HOSPITAL_COMMUNITY)
Admission: EM | Admit: 2012-01-26 | Discharge: 2012-01-26 | Disposition: A | Discharge status: Other Acute Inpatient Hospital | Payer: Medicare Other | Source: Home / Self Care | Attending: Emergency Medicine | Admitting: Emergency Medicine

## 2012-01-26 ENCOUNTER — Encounter (HOSPITAL_COMMUNITY): Payer: Self-pay | Admitting: Emergency Medicine

## 2012-01-26 DIAGNOSIS — F29 Unspecified psychosis not due to a substance or known physiological condition: Secondary | ICD-10-CM | POA: Insufficient documentation

## 2012-01-26 DIAGNOSIS — R279 Unspecified lack of coordination: Secondary | ICD-10-CM

## 2012-01-26 DIAGNOSIS — R4182 Altered mental status, unspecified: Secondary | ICD-10-CM

## 2012-01-26 DIAGNOSIS — R270 Ataxia, unspecified: Secondary | ICD-10-CM

## 2012-01-26 DIAGNOSIS — M2559 Pain in other specified joint: Secondary | ICD-10-CM | POA: Insufficient documentation

## 2012-01-26 DIAGNOSIS — M2550 Pain in unspecified joint: Secondary | ICD-10-CM

## 2012-01-26 HISTORY — DX: Hyperlipidemia, unspecified: E78.5

## 2012-01-26 HISTORY — DX: Unspecified osteoarthritis, unspecified site: M19.90

## 2012-01-26 HISTORY — DX: Essential (primary) hypertension: I10

## 2012-01-26 NOTE — ED Notes (Signed)
[facility] tx for right knee and ankle pain, reports confusion x 1 week, neuro exam was wnl

## 2012-01-26 NOTE — ED Notes (Signed)
Pt requesting to leave, advised to stay and be seen.  Pt and family member insisting to leave and come back in the morning.

## 2012-01-26 NOTE — Discharge Instructions (Signed)
We have determined that your problem requires further evaluation in the emergency department.  We will take care of your transport there.  Once at the emergency department, you will be evaluated by a provider and they will order whatever treatment or tests they deem necessary.  We cannot guarantee that they will do any specific test or do any specific treatment.  ° °

## 2012-01-26 NOTE — ED Notes (Signed)
Multiple complaints today.  C/o dizziness for 2 days, intermittent.  In morning feels dizzy.  Patient and wife feel dizziness is associated with taking nabumetone.  Patient Calvin Perez report history of gout,  Saw dr Penni Bombard and started taking etodolac.  Patient reports pain included more than ankle, pain traveled up right leg.  Orthopedic doctor said pain was arthritis.  Reports pain is worsening.

## 2012-01-26 NOTE — ED Notes (Signed)
Rt knee pain for 2 days.  Sent here from [facility]. He speaks little english

## 2012-01-26 NOTE — ED Provider Notes (Signed)
Chief Complaint  Patient presents with  . Dizziness    History of Present Illness:   Calvin Perez is a 71 year old male with diabetes and hypertension. He comes in today with multiple complaints that are somewhat difficult to sort out. He is with his wife. His main complaint is knee pain. This is been localized to the popliteal fossa has been going on for about 2-3 years. He is having any swelling. He saw who he thinks is his orthopedist although this actually his vascular surgeon Dr. Penni Bombard. He thinks he had an x-ray of the knee but never had an MRI. He's been on etodolac and nabumetone. But these really haven't done much good. He also complains of some right ankle pain and has a history of gout.  His wife's main concern today is the fact that he is dizzy, confused, and having trouble speaking. This has been going on for about the past week. He is unsteady on his feet. He has not fallen, but he walks very slowly. Over the past 3 days he has been confused and forgetful. She states she's not talking very clearly. His right leg seems weak. His hearing seems a little bit worse. He denies any headache, blurry vision, double vision, paresthesias, loss of consciousness, or seizures. There has been no weakness of his upper extremities.  Review of Systems:  Other than noted above, the patient denies any of the following symptoms. Systemic:  No fever, chills, sweats, fatigue, myalgias, headache, or anorexia. Eye:  No redness, pain or drainage. ENT:  No earache, nasal congestion, rhinorrhea, sinus pressure, or sore throat. Lungs:  No cough, sputum production, wheezing, shortness of breath.  Cardiovascular:  No chest pain, palpitations, or syncope. GI:  No nausea, vomiting, abdominal pain or diarrhea. GU:  No dysuria, frequency, or hematuria. Skin:  No rash or pruritis.  PMFSH:  Past medical history, family history, social history, meds, and allergies were reviewed.  Physical Exam:   Vital signs:  BP  160/75  Pulse 83  Temp(Src) 97.5 F (36.4 C) (Oral)  Resp 20  SpO2 97% General:  Alert, in no distress. Eye:  PERRL, full EOMs.  Lids and conjunctivas were normal. ENT:  TMs and canals were normal, without erythema or inflammation.  Nasal mucosa was clear and uncongested, without drainage.  Mucous membranes were moist.  Pharynx was clear, without exudate or drainage.  There were no oral ulcerations or lesions. Neck:  Supple, no adenopathy, tenderness or mass. Thyroid was normal. Lungs:  No respiratory distress.  Lungs were clear to auscultation, without wheezes, rales or rhonchi.  Breath sounds were clear and equal bilaterally. Heart:  Regular rhythm, without gallops, murmers or rubs. Abdomen:  Soft, flat, and non-tender to palpation.  No hepatosplenomagaly or mass. Skin:  Clear, warm, and dry, without rash or lesions. Neurological exam: He is alert and oriented x3. His speech seems clear to me, although his wife states at times is not very clear. VDRL, full EOMs. Cranial nerves are intact. There is no pronator drift, finger-to-nose is normal. Hand grips are equal. DTRs are normal. He walks with an antalgic gait it does not appear to be ataxic.  Assessment:  The primary encounter diagnosis was Mental status change. Diagnoses of Arthralgia and Ataxia were also pertinent to this visit. I suspect her pain the knee is probably due to osteoarthritis or internal derangement of the knee. I am more concerned about his neurological symptoms and what seems to his wife to be mental status changes. This  may represent a stroke or possibly just chronic dementia.  Plan:   1.  The following meds were prescribed:   New Prescriptions   No medications on file   2.  The patient was transported to the emergency department via shuttle.  Reuben Likes, MD 01/26/12 2203

## 2012-01-26 NOTE — ED Notes (Signed)
Advised of AMA policy.

## 2012-02-01 ENCOUNTER — Observation Stay (HOSPITAL_COMMUNITY): Payer: Medicare Other

## 2012-02-01 ENCOUNTER — Observation Stay (HOSPITAL_COMMUNITY)
Admission: AD | Admit: 2012-02-01 | Discharge: 2012-02-02 | Disposition: A | Discharge status: Home / Self Care | Payer: Medicare Other | Source: Ambulatory Visit | Attending: Cardiovascular Disease | Admitting: Cardiovascular Disease

## 2012-02-01 ENCOUNTER — Encounter (HOSPITAL_COMMUNITY): Payer: Self-pay | Admitting: General Practice

## 2012-02-01 DIAGNOSIS — R4789 Other speech disturbances: Secondary | ICD-10-CM | POA: Insufficient documentation

## 2012-02-01 DIAGNOSIS — E785 Hyperlipidemia, unspecified: Secondary | ICD-10-CM | POA: Insufficient documentation

## 2012-02-01 DIAGNOSIS — R5381 Other malaise: Secondary | ICD-10-CM | POA: Insufficient documentation

## 2012-02-01 DIAGNOSIS — M129 Arthropathy, unspecified: Secondary | ICD-10-CM | POA: Insufficient documentation

## 2012-02-01 DIAGNOSIS — R42 Dizziness and giddiness: Secondary | ICD-10-CM | POA: Diagnosis present

## 2012-02-01 DIAGNOSIS — E119 Type 2 diabetes mellitus without complications: Secondary | ICD-10-CM | POA: Insufficient documentation

## 2012-02-01 DIAGNOSIS — Z951 Presence of aortocoronary bypass graft: Secondary | ICD-10-CM | POA: Insufficient documentation

## 2012-02-01 DIAGNOSIS — I2510 Atherosclerotic heart disease of native coronary artery without angina pectoris: Secondary | ICD-10-CM | POA: Insufficient documentation

## 2012-02-01 DIAGNOSIS — R269 Unspecified abnormalities of gait and mobility: Secondary | ICD-10-CM | POA: Diagnosis present

## 2012-02-01 DIAGNOSIS — I6350 Cerebral infarction due to unspecified occlusion or stenosis of unspecified cerebral artery: Principal | ICD-10-CM | POA: Insufficient documentation

## 2012-02-01 DIAGNOSIS — I10 Essential (primary) hypertension: Secondary | ICD-10-CM | POA: Insufficient documentation

## 2012-02-01 DIAGNOSIS — Z794 Long term (current) use of insulin: Secondary | ICD-10-CM | POA: Insufficient documentation

## 2012-02-01 HISTORY — DX: Atherosclerotic heart disease of native coronary artery without angina pectoris: I25.10

## 2012-02-01 LAB — GLUCOSE, CAPILLARY: Glucose-Capillary: 218 mg/dL — ABNORMAL HIGH (ref 70–99)

## 2012-02-01 LAB — HEMOGLOBIN A1C
Hgb A1c MFr Bld: 8.1 % — ABNORMAL HIGH
Mean Plasma Glucose: 186 mg/dL — ABNORMAL HIGH

## 2012-02-01 LAB — COMPREHENSIVE METABOLIC PANEL
ALT: 27 U/L (ref 0–53)
AST: 26 U/L (ref 0–37)
Alkaline Phosphatase: 63 U/L (ref 39–117)
Calcium: 9.5 mg/dL (ref 8.4–10.5)
Potassium: 4.2 mEq/L (ref 3.5–5.1)
Sodium: 137 mEq/L (ref 135–145)
Total Protein: 7.1 g/dL (ref 6.0–8.3)

## 2012-02-01 LAB — DIFFERENTIAL
Eosinophils Absolute: 0.2 10*3/uL (ref 0.0–0.7)
Eosinophils Relative: 3 % (ref 0–5)
Lymphocytes Relative: 31 % (ref 12–46)
Lymphs Abs: 1.7 10*3/uL (ref 0.7–4.0)
Monocytes Absolute: 0.6 10*3/uL (ref 0.1–1.0)
Monocytes Relative: 11 % (ref 3–12)

## 2012-02-01 LAB — CBC
HCT: 40.7 % (ref 39.0–52.0)
Hemoglobin: 13.3 g/dL (ref 13.0–17.0)
MCH: 27.7 pg (ref 26.0–34.0)
MCHC: 32.7 g/dL (ref 30.0–36.0)
MCV: 84.6 fL (ref 78.0–100.0)
Platelets: 243 10*3/uL (ref 150–400)
RBC: 4.81 MIL/uL (ref 4.22–5.81)
RDW: 14.9 % (ref 11.5–15.5)
WBC: 5.6 10*3/uL (ref 4.0–10.5)

## 2012-02-01 MED ORDER — SODIUM CHLORIDE 0.9 % IV SOLN: 250.0000 mL | INTRAVENOUS | Status: DC | PRN

## 2012-02-01 MED ORDER — INSULIN ASPART PROT & ASPART (70-30 MIX) 100 UNIT/ML ~~LOC~~ SUSP
40.0000 [IU] | Freq: Every day | SUBCUTANEOUS | Status: DC
  Filled 2012-02-01: qty 3

## 2012-02-01 MED ORDER — INSULIN ASPART 100 UNIT/ML ~~LOC~~ SOLN
0.0000 [IU] | Freq: Three times a day (TID) | SUBCUTANEOUS | Status: DC
  Administered 2012-02-01: 5 [IU] via SUBCUTANEOUS
  Administered 2012-02-02: 8 [IU] via SUBCUTANEOUS
  Administered 2012-02-02: 5 [IU] via SUBCUTANEOUS
  Administered 2012-02-02: 3 [IU] via SUBCUTANEOUS

## 2012-02-01 MED ORDER — ONDANSETRON HCL 4 MG/2ML IJ SOLN: 4.0000 mg | Freq: Four times a day (QID) | INTRAMUSCULAR | Status: DC | PRN

## 2012-02-01 MED ORDER — ACETAMINOPHEN 650 MG RE SUPP: 650.0000 mg | Freq: Four times a day (QID) | RECTAL | Status: DC | PRN

## 2012-02-01 MED ORDER — SODIUM CHLORIDE 0.9 % IJ SOLN
3.0000 mL | Freq: Two times a day (BID) | INTRAMUSCULAR | Status: DC
  Administered 2012-02-01 – 2012-02-02 (×2): 3 mL via INTRAVENOUS

## 2012-02-01 MED ORDER — ALUM & MAG HYDROXIDE-SIMETH 200-200-20 MG/5ML PO SUSP: 30.0000 mL | Freq: Four times a day (QID) | ORAL | Status: DC | PRN

## 2012-02-01 MED ORDER — ATENOLOL 25 MG PO TABS
25.0000 mg | ORAL_TABLET | Freq: Every day | ORAL | Status: DC
  Administered 2012-02-02: 25 mg via ORAL
  Filled 2012-02-01 (×2): qty 1

## 2012-02-01 MED ORDER — DOCUSATE SODIUM 100 MG PO CAPS
100.0000 mg | ORAL_CAPSULE | Freq: Two times a day (BID) | ORAL | Status: DC
  Administered 2012-02-01 – 2012-02-02 (×3): 100 mg via ORAL
  Filled 2012-02-01 (×5): qty 1

## 2012-02-01 MED ORDER — EZETIMIBE-SIMVASTATIN 10-40 MG PO TABS
1.0000 | ORAL_TABLET | Freq: Every day | ORAL | Status: DC
  Administered 2012-02-02: 1 via ORAL
  Filled 2012-02-01: qty 1

## 2012-02-01 MED ORDER — INSULIN ASPART PROT & ASPART (70-30 MIX) 100 UNIT/ML ~~LOC~~ SUSP
14.0000 [IU] | Freq: Every day | SUBCUTANEOUS | Status: DC
  Administered 2012-02-01 – 2012-02-02 (×2): 14 [IU] via SUBCUTANEOUS
  Filled 2012-02-01: qty 10

## 2012-02-01 MED ORDER — INSULIN ASPART PROT & ASPART (70-30 MIX) 100 UNIT/ML ~~LOC~~ SUSP: 14.0000 [IU] | Freq: Every day | SUBCUTANEOUS | Status: DC

## 2012-02-01 MED ORDER — SODIUM CHLORIDE 0.9 % IJ SOLN: 3.0000 mL | INTRAMUSCULAR | Status: DC | PRN

## 2012-02-01 MED ORDER — IRBESARTAN 150 MG PO TABS
150.0000 mg | ORAL_TABLET | Freq: Every day | ORAL | Status: DC
  Administered 2012-02-02: 150 mg via ORAL
  Filled 2012-02-01 (×2): qty 1

## 2012-02-01 MED ORDER — ENOXAPARIN SODIUM 40 MG/0.4ML ~~LOC~~ SOLN
40.0000 mg | SUBCUTANEOUS | Status: DC
  Administered 2012-02-01: 40 mg via SUBCUTANEOUS
  Filled 2012-02-01 (×2): qty 0.4

## 2012-02-01 MED ORDER — INSULIN ASPART PROT & ASPART (70-30 MIX) 100 UNIT/ML ~~LOC~~ SUSP
40.0000 [IU] | Freq: Every day | SUBCUTANEOUS | Status: DC
  Administered 2012-02-02: 40 [IU] via SUBCUTANEOUS

## 2012-02-01 MED ORDER — ASPIRIN EC 325 MG PO TBEC
325.0000 mg | DELAYED_RELEASE_TABLET | Freq: Every day | ORAL | Status: DC
  Administered 2012-02-01 – 2012-02-02 (×2): 325 mg via ORAL
  Filled 2012-02-01 (×2): qty 1

## 2012-02-01 MED ORDER — SODIUM CHLORIDE 0.9 % IV SOLN
100.0000 mg | INTRAVENOUS | Status: DC
  Administered 2012-02-01: 100 mg via INTRAVENOUS
  Filled 2012-02-01 (×2): qty 100

## 2012-02-01 MED ORDER — ACETAMINOPHEN 325 MG PO TABS: 650.0000 mg | ORAL_TABLET | Freq: Four times a day (QID) | ORAL | Status: DC | PRN

## 2012-02-01 MED ORDER — ONDANSETRON HCL 4 MG PO TABS: 4.0000 mg | ORAL_TABLET | Freq: Four times a day (QID) | ORAL | Status: DC | PRN

## 2012-02-01 MED ORDER — SODIUM CHLORIDE 0.9 % IJ SOLN
3.0000 mL | Freq: Two times a day (BID) | INTRAMUSCULAR | Status: DC
  Administered 2012-02-02: 3 mL via INTRAVENOUS

## 2012-02-01 MED ORDER — ADULT MULTIVITAMIN W/MINERALS CH
1.0000 | ORAL_TABLET | Freq: Every day | ORAL | Status: DC
  Administered 2012-02-01 – 2012-02-02 (×2): 1 via ORAL
  Filled 2012-02-01 (×2): qty 1

## 2012-02-01 NOTE — Progress Notes (Addendum)
TC  From Dr.  Pecolia Ades from MRI noted poor study but noted small infarct left aspect of pons no hemorrhage. TC Dr. [name] no new orders at this time will cont to observe

## 2012-02-01 NOTE — Progress Notes (Signed)
Pt. Refuses to have bed alarm on reviewed safety plan and pt. Is adamant reports he will leave if the bed alarm is left on pt. Ambulated to nurses  Station with stand by supervision with no problems will leave bed alarm off at this time will monitor closely for unsafe gait

## 2012-02-01 NOTE — H&P (Signed)
Calvin Perez is an 71 y.o. male.   Chief Complaint: Weakness, dizziness and speech problem. HPI: 71 years old Asian [place] male had right leg weakness and speech disturbance 1 week ago. He went to Urgent care followed by ER at [facility] 1 week ago. Tired of waiting in ER he decided to go home without evaluation. Today he came to office and said he is fine but wants to be checked out. He also had dizziness last week. Speech is normal now. He is walking with a limp. No history of stroke before.  Past Medical History  Diagnosis Date  . Hypertension   . Arthritis   . Dyslipidemia   . Coronary artery disease   . Diabetes mellitus     insulin dependent      Past Surgical History  Procedure Date  . Open heart surgery 1998  . Coronary artery bypass graft   . Cardiac catheterization     History reviewed. No pertinent family history. Mom died of diabetic complication age 8 and Dad died of MI age 16.He had 2 brothers, one died in accident and has 5 sisters.   Social History:  reports that he has never smoked. He has never used smokeless tobacco. He reports that he does not drink alcohol or use illicit drugs. He is retired, married to [place], age 17 and has one son and one daughter.  Allergies: No Known Allergies  Medications Prior to Admission  Medication Sig Dispense Refill  . aspirin 81 MG tablet Take 81 mg by mouth daily.      Marland Kitchen etodolac (LODINE) 400 MG tablet Take 400 mg by mouth 2 (two) times daily.      Marland Kitchen ezetimibe-simvastatin (VYTORIN) 10-40 MG per tablet Take 1 tablet by mouth at bedtime.      . insulin glargine (LANTUS) 100 UNIT/ML injection Inject 60 Units into the skin daily with breakfast.       . insulin glulisine (APIDRA) 100 UNIT/ML injection Inject 20-55 Units into the skin 3 (three) times daily before meals. 20 units at breakfast, 55 units at lunch, 40 units at supper      . labetalol (NORMODYNE) 100 MG tablet Take 100 mg by mouth 2 (two) times daily.      .  nabumetone (RELAFEN) 500 MG tablet Take 500 mg by mouth 2 (two) times daily.        Results for orders placed during the hospital encounter of 02/01/12 (from the past 48 hour(s))  CBC     Status: Normal   Collection Time   02/01/12  2:05 PM      Component Value Range Comment   WBC 5.6  4.0 - 10.5 (K/uL)    RBC 4.81  4.22 - 5.81 (MIL/uL)    Hemoglobin 13.3  13.0 - 17.0 (g/dL)    HCT 86.5  78.4 - 69.6 (%)    MCV 84.6  78.0 - 100.0 (fL)    MCH 27.7  26.0 - 34.0 (pg)    MCHC 32.7  30.0 - 36.0 (g/dL)    RDW 29.5  28.4 - 13.2 (%)    Platelets 243  150 - 400 (K/uL)   COMPREHENSIVE METABOLIC PANEL     Status: Abnormal   Collection Time   02/01/12  2:05 PM      Component Value Range Comment   Sodium 137  135 - 145 (mEq/L)    Potassium 4.2  3.5 - 5.1 (mEq/L)    Chloride 102  96 - 112 (  mEq/L)    CO2 27  19 - 32 (mEq/L)    Glucose, Bld 213 (*) 70 - 99 (mg/dL)    BUN 15  6 - 23 (mg/dL)    Creatinine, Ser 1.61  0.50 - 1.35 (mg/dL)    Calcium 9.5  8.4 - 10.5 (mg/dL)    Total Protein 7.1  6.0 - 8.3 (g/dL)    Albumin 3.7  3.5 - 5.2 (g/dL)    AST 26  0 - 37 (U/L)    ALT 27  0 - 53 (U/L)    Alkaline Phosphatase 63  39 - 117 (U/L)    Total Bilirubin 0.2 (*) 0.3 - 1.2 (mg/dL)    GFR calc non Af Amer 64 (*) >90 (mL/min)    GFR calc Af Amer 74 (*) >90 (mL/min)   DIFFERENTIAL     Status: Normal   Collection Time   02/01/12  2:05 PM      Component Value Range Comment   Neutrophils Relative 56  43 - 77 (%)    Neutro Abs 3.2  1.7 - 7.7 (K/uL)    Lymphocytes Relative 31  12 - 46 (%)    Lymphs Abs 1.7  0.7 - 4.0 (K/uL)    Monocytes Relative 11  3 - 12 (%)    Monocytes Absolute 0.6  0.1 - 1.0 (K/uL)    Eosinophils Relative 3  0 - 5 (%)    Eosinophils Absolute 0.2  0.0 - 0.7 (K/uL)    Basophils Relative 0  0 - 1 (%)    Basophils Absolute 0.0  0.0 - 0.1 (K/uL)   HEMOGLOBIN A1C     Status: Abnormal   Collection Time   02/01/12  2:05 PM      Component Value Range Comment   Hemoglobin A1C 8.1 (*) <5.7  (%)    Mean Plasma Glucose 186 (*) <117 (mg/dL)   GLUCOSE, CAPILLARY     Status: Abnormal   Collection Time   02/01/12  2:05 PM      Component Value Range Comment   Glucose-Capillary 237 (*) 70 - 99 (mg/dL)    Comment 1 Notify RN     GLUCOSE, CAPILLARY     Status: Abnormal   Collection Time   02/01/12  4:41 PM      Component Value Range Comment   Glucose-Capillary 218 (*) 70 - 99 (mg/dL)    X-ray Chest Pa And Lateral   02/01/2012  *RADIOLOGY REPORT*  Clinical Data:  Dizziness  CHEST - 2 VIEW  Comparison: 01/13/2009  Findings: Minimal enlargement of cardiac silhouette post CABG. Mediastinal contours and pulmonary vascularity normal. Bibasilar atelectasis. Upper lungs clear. No pleural effusion or pneumothorax. No acute osseous findings.  IMPRESSION: Bibasilar atelectasis. Minimal enlargement of cardiac silhouette post CABG.  Original Report Authenticated By: Lollie Marrow, M.D.   Mr Brain Ltd W/o Cm  02/01/2012  *RADIOLOGY REPORT*  Clinical Data: Right-sided weakness, dizziness and confusion.  MRI HEAD WITHOUT CONTRAST  Technique:  Multiplanar, multiecho pulse sequences of the brain and surrounding structures were obtained according to standard protocol without intravenous contrast.  Comparison: MRI brain 01/14/2009.  Findings: Examination is limited to diffusion weighted imaging and sagittal T1 sequence of the brain.  The patient cannot tolerate any further imaging evaluation.  The diffusion weighted images demonstrate a left paracentral pontine acute infarct.  No findings for hemispheric infarction.  No obvious intracranial hemorrhage.  Stable changes of cerebral atrophy and associated ventriculomegaly.  IMPRESSION: Left paracentral pontine infarct.  No obvious hemorrhage.  Findings called to the patient's nurse, Marylu Lund.  Original Report Authenticated By: P. Loralie Champagne, M.D.    @ROS @. No weight gain or loss, wears reading glasses, No dentures. + chest pain, Neck pain and joint pains. No shortness  of breath, hemoptysis, hematemesis, rectal bleed, hepatitis, kidney stone or stroke until now.  Blood pressure 152/75, pulse 70, temperature 98.1 F (36.7 C), temperature source Oral, resp. rate 18, height 5\' 5"  (1.651 m), weight 81.647 kg (180 lb), SpO2 100.00%. Physical Exam: HEENT: [place]/AT, frontal baldness, brown eyes, PERL, EOMI. Bil. Lens implants, tongue pink, moist and midline.  Neck: No JVD, no bruit. Lungs: Clear. Heart: S1, S2 normal. Abdomen: Distended but soft and non-tender. Ext:No E/C/C. CNS: Moves all 4 ext but mild right lower leg weakness. Skin:- Warm and dry.   Assessment/Plan 1. Weakness and dizziness 2. R/O stroke 3. R/O cardiac dysrhythmia 4. DM, II-250.00 5. Hypertension 6. CABG 7/ Cervical disc disease  Plan: Blood work, EKG, MRI/MRA brain.  Dannell Gortney S 02/01/2012, 10:10 PM

## 2012-02-01 NOTE — Progress Notes (Signed)
MD advised RN that patient is not being worked up for stroke and to not do NIH or swallow screen.  Will continue to monitor. Nolon Nations

## 2012-02-02 ENCOUNTER — Observation Stay (HOSPITAL_COMMUNITY): Payer: Medicare Other

## 2012-02-02 ENCOUNTER — Other Ambulatory Visit (HOSPITAL_COMMUNITY): Payer: Medicare Other

## 2012-02-02 DIAGNOSIS — I669 Occlusion and stenosis of unspecified cerebral artery: Secondary | ICD-10-CM

## 2012-02-02 LAB — GLUCOSE, CAPILLARY
Glucose-Capillary: 163 mg/dL — ABNORMAL HIGH (ref 70–99)
Glucose-Capillary: 216 mg/dL — ABNORMAL HIGH (ref 70–99)

## 2012-02-02 MED ORDER — ALLOPURINOL 100 MG PO TABS: 100.0000 mg | ORAL_TABLET | Freq: Every day | ORAL | Status: DC

## 2012-02-02 MED ORDER — ASPIRIN 325 MG PO TBEC: 325.0000 mg | DELAYED_RELEASE_TABLET | Freq: Every day | ORAL | Status: AC

## 2012-02-02 MED ORDER — CLOPIDOGREL BISULFATE 75 MG PO TABS: 75.0000 mg | ORAL_TABLET | Freq: Every day | ORAL | Status: AC

## 2012-02-02 NOTE — Progress Notes (Signed)
VASCULAR LAB PRELIMINARY  PRELIMINARY  PRELIMINARY  PRELIMINARY  Carotid duplex completed.    Preliminary report:  Right - No evidence of significant ICA stenosis. Stent appears patent. There is an ECA stenosis                                 Left -   40-59% internal carotid artery stenosis upper end of range. ECA stenoisi                                 Vertebrals are patent and flow is antegrade  Hatem Cull D, RVS 02/02/2012, 10:55 AM

## 2012-02-02 NOTE — Progress Notes (Signed)
Inpatient Diabetes Program Recommendations  AACE/ADA: New Consensus Statement on Inpatient Glycemic Control (2009)  Target Ranges:  Prepandial:   less than 140 mg/dL      Peak postprandial:   less than 180 mg/dL (1-2 hours)      Critically ill patients:  140 - 180 mg/dL   Diabetes Coordinator spoke with patient for clarification of med reconciliation and current insulin orders.  Patient states that he takes Lantus and Apidra at home.  He is not sure why he is ordered Novolog 70/30 during this admission.  MD please clarify.   A1C= 8.1 will need follow-up with his endocrinologist or primary MD for close glucose management after discharge.    Thank you  Piedad Climes [facility] Inpatient Diabetes Coordinator [phone]

## 2012-02-02 NOTE — Discharge Summary (Signed)
Physician Discharge Summary  Patient ID: Calvin Perez MRN: [id number] DOB/AGE: 02-Nov-1940 71 y.o.  Admit date: 02/01/2012 Discharge date: 02/02/2012  Admission Diagnoses: Dizziness Gait disturbance r/o stroke DM, II Hypertension CABG  Discharge Diagnoses:  Active Problems: Left paracentral pontine infarct.*Principle diagnosis Dizziness Gait disturbance DM, II Hypertension CABG  Discharged Condition: good  Hospital Course: 71 years old [place] male had right leg weakness and speech disturbance 1 week ago. He also had dizziness last week. Speech is normal now. He is walking with a limp. MRI of brain showed left paracentral pontine infarct. Ultrasound of heart was unremarkable and right carotid stent is patent with mild disease of left carotid artery. Plavix is added and aspirin is increased to 325 mg. one daily. He will resume healthy life style and see me in 2 weeks. He is anxious to go home so neurology referral will be made on out patient basis  Consults: cardiology  Significant Diagnostic Studies: labs: Normal Hgb, WBC and platelets count and radiology: MRI: Left paracentral pontine infarct. No obvious hemorrhage. Limited study due to patient non-compliance   Treatments: cardiac meds: labetolol and anticoagulation: ASA and Plavix  Discharge Exam: Blood pressure 159/74, pulse 66, temperature 98 F (36.7 C), temperature source Oral, resp. rate 18, height 5\' 5"  (1.651 m), weight 82.146 kg (181 lb 1.6 oz), SpO2 95.00%. Physical Exam:  HEENT: [place]/AT, frontal baldness, brown eyes, PERL, EOMI. Bil. Lens implants, tongue pink, moist and midline.  Neck: No JVD, no bruit.  Lungs: Clear.  Heart: S1, S2 normal.  Abdomen: Distended but soft and non-tender.  Ext:No E/C/C.  CNS: Moves all 4 ext but mild right lower leg weakness.  Skin:- Warm and dry.   Disposition: 01-Home or Self Care   Medication List  As of 02/02/2012  6:08 PM   STOP taking these medications        aspirin 81 MG tablet      etodolac 400 MG tablet      nabumetone 500 MG tablet         TAKE these medications         aspirin 325 MG EC tablet   Take 1 tablet (325 mg total) by mouth daily.      clopidogrel 75 MG tablet   Commonly known as: PLAVIX   Take 1 tablet (75 mg total) by mouth daily.      ezetimibe-simvastatin 10-40 MG per tablet   Commonly known as: VYTORIN   Take 1 tablet by mouth at bedtime.      insulin glargine 100 UNIT/ML injection   Commonly known as: LANTUS   Inject 60 Units into the skin daily with breakfast.      insulin glulisine 100 UNIT/ML injection   Commonly known as: APIDRA   Inject 20-55 Units into the skin 3 (three) times daily before meals. 20 units at breakfast, 55 units at lunch, 40 units at supper      labetalol 100 MG tablet   Commonly known as: NORMODYNE   Take 100 mg by mouth 2 (two) times daily.           Follow-up Information    Follow up with [facility] S, MD. Schedule an appointment as soon as possible for a visit in 2 weeks.   Contact information:   [address] [place] [address] [phone]          Signed: Ricki Rodriguez 02/02/2012, 6:08 PM

## 2012-02-02 NOTE — Progress Notes (Signed)
  Echocardiogram 2D Echocardiogram has been performed.  Calvin Perez A 02/02/2012, 10:58 AM

## 2012-02-22 ENCOUNTER — Other Ambulatory Visit (HOSPITAL_COMMUNITY): Payer: Self-pay | Admitting: Cardiovascular Disease

## 2012-02-22 DIAGNOSIS — I2510 Atherosclerotic heart disease of native coronary artery without angina pectoris: Secondary | ICD-10-CM

## 2012-03-07 ENCOUNTER — Other Ambulatory Visit (HOSPITAL_COMMUNITY): Payer: Medicare Other

## 2013-04-17 ENCOUNTER — Ambulatory Visit (INDEPENDENT_AMBULATORY_CARE_PROVIDER_SITE_OTHER): Payer: Medicare HMO | Admitting: Endocrinology

## 2013-04-17 ENCOUNTER — Encounter: Payer: Self-pay | Admitting: Endocrinology

## 2013-04-17 VITALS — BP 126/84 | HR 77 | Ht 66.0 in | Wt 173.1 lb

## 2013-04-17 DIAGNOSIS — I10 Essential (primary) hypertension: Secondary | ICD-10-CM

## 2013-04-17 DIAGNOSIS — I639 Cerebral infarction, unspecified: Secondary | ICD-10-CM

## 2013-04-17 DIAGNOSIS — E785 Hyperlipidemia, unspecified: Secondary | ICD-10-CM

## 2013-04-17 DIAGNOSIS — I6350 Cerebral infarction due to unspecified occlusion or stenosis of unspecified cerebral artery: Secondary | ICD-10-CM

## 2013-04-17 DIAGNOSIS — IMO0001 Reserved for inherently not codable concepts without codable children: Secondary | ICD-10-CM

## 2013-04-17 LAB — LIPID PANEL
Cholesterol: 141 mg/dL (ref 0–200)
LDL Cholesterol: 86 mg/dL (ref 0–99)
Triglycerides: 130 mg/dL (ref 0.0–149.0)

## 2013-04-17 LAB — COMPREHENSIVE METABOLIC PANEL
ALT: 35 U/L (ref 0–53)
Albumin: 3.7 g/dL (ref 3.5–5.2)
CO2: 29 mEq/L (ref 19–32)
GFR: 62.38 mL/min (ref >60.00)
Glucose, Bld: 226 mg/dL — ABNORMAL HIGH (ref 70–99)
Potassium: 4.4 mEq/L (ref 3.5–5.1)
Sodium: 135 mEq/L (ref 135–145)
Total Protein: 7.7 g/dL (ref 6.0–8.3)

## 2013-04-17 LAB — URINALYSIS, ROUTINE W REFLEX MICROSCOPIC
Bilirubin Urine: NEGATIVE
Hgb urine dipstick: NEGATIVE
Ketones, ur: NEGATIVE
Leukocytes, UA: NEGATIVE
Nitrite: NEGATIVE
Total Protein, Urine: NEGATIVE

## 2013-04-17 LAB — MICROALBUMIN / CREATININE URINE RATIO
Microalb Creat Ratio: 0.7 mg/g (ref 0.0–30.0)
Microalb, Ur: 0.6 mg/dL (ref 0.0–1.9)

## 2013-04-17 MED ORDER — VALSARTAN 80 MG PO TABS: 80.0000 mg | ORAL_TABLET | Freq: Every day | ORAL | Status: DC

## 2013-04-17 NOTE — Patient Instructions (Addendum)
Please check blood sugars at least half the time about 2-3 hours after any meal and as directed on waking up. Please bring blood sugar monitor to each visit  START DIOVAN FOR BP  HAVE PROTEIN AT [facility] MEAL

## 2013-04-17 NOTE — Progress Notes (Signed)
Patient ID: Calvin Perez, male   DOB: December 20, 1940, 72 y.o.   MRN: [id number]  Reason for Appointment: Diabetes follow-up   History of Present Illness   Diagnosis: Type 2 DIABETES MELITUS, since 1984       Oral hypoglycemic drugs: None at present        Side effects from medications: ? GI side effects from metformin He has been on insulin regimen since about 2004. Insulin regimen: Lantus 70 units at bedtime, previously was on 60.  Apdra before meals 20 units           Proper timing of medications in relation to meals: may forget  Mealtime insulin previously, now better compliant with the help of his wife.         Monitors blood glucose: Once a day.    Glucometer: One Touch.          Blood Glucose readings:  he has not checked many readings lately and has only a few readings in the mornings which are mostly under 150 Hypoglycemia frequency: Never.          Meals: 3 meals per day.     he is generally eating vegetarian meals and does not get much protein it is meals        Physical activity: exercise:  Unable to do any recently because of stroke           Complications: are: Atherosclerotic vascular disease, recent stroke     His hemoglobin A1c in the past has usually been about 8-9% and has not been seen in followup for several months now. Mostly he has difficulty with postprandial hyperglycemia Also did not do to try to check his readings after meals and mostly in the morning or lunchtime. Also has difficulty losing weight    HYPERTENSION:  he has previously been on multiple drugs including labetalol and Diovan but his medications were stopped on discharge from the hospital. Does not monitor blood pressure at home  HYPERLIPIDEMIA:         The lipid abnormality consists of elevated LDL and this has been generally difficult to control even with Vytorin, probably because of noncompliance with medications and financial reasons.      Medication List       This list is accurate as of: 04/17/13  10:40 AM.  Always use your most recent med list.               aspirin 81 MG tablet  Take 81 mg by mouth daily.     atorvastatin 80 MG tablet  Commonly known as:  LIPITOR  Take 80 mg by mouth daily.     clopidogrel 75 MG tablet  Commonly known as:  PLAVIX  Take 75 mg by mouth daily.     ezetimibe-simvastatin 10-40 MG per tablet  Commonly known as:  VYTORIN  Take 1 tablet by mouth at bedtime.     insulin glargine 100 UNIT/ML injection  Commonly known as:  LANTUS  Inject 70 Units into the skin daily with breakfast.     insulin glulisine 100 UNIT/ML injection  Commonly known as:  APIDRA  Inject 20 Units into the skin 3 (three) times daily before meals. 20 units at breakfast, 55 units at lunch, 40 units at supper     labetalol 100 MG tablet  Commonly known as:  NORMODYNE  Take 100 mg by mouth 2 (two) times daily.        Allergies: No Known Allergies  Past Medical History  Diagnosis Date  . Hypertension   . Arthritis   . Dyslipidemia   . Coronary artery disease   . Diabetes mellitus     insulin dependent    Past Surgical History  Procedure Laterality Date  . Open heart surgery  1998  . Coronary artery bypass graft    . Cardiac catheterization      No family history on file.  Social History:  reports that he has never smoked. He has never used smokeless tobacco. He reports that he does not drink alcohol or use illicit drugs.  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 Was evaluated in [place] and no details available except discharge instructions and medication list No history of recent GI problems    Examination:   BP 126/84  Pulse 77  Ht 5\' 6"  (1.676 m)  Wt 173 lb 1.6 oz (78.518 kg)  BMI 27.95 kg/m2  SpO2  97%  Body mass index is 27.95 kg/(m^2).   Speech is normal No pedal edema Gait is normal  Assesment:   1. Diabetes type 2, uncontrolled - 250.02  The patient's diabetes control appears to be relatively better controlled than before although his blood sugars are somewhat variable. His fasting readings are generally good except for having 2 high readings. This may be related to inconsistent compliance with his insulin although he thinks he is taking this more regularly His postprandial readings are difficult to assess as he often does not check readings later in the day, probably getting inadequate coverage for lunch suppertime readings are mostly higher He will need to have better regimen of monitoring his blood sugar and this was discussed as well as balanced meals with more protein noncompliance with insulin especially at meal times He will start physical therapy for exercise We'll increase his lunchtime dose at least 5 units for lunch and continue the same Lantus since fasting readings the last 4 days have been fairly good His wife was present today and insulin regimen as well as diet discussed in detail as well as blood sugar monitoring and targets  HYPERCHOLESTEROLEMIA: He has been switched to Lipitor and his lipids will be reassessed, this has usually been inadequate in the past for control  Hypertension: Currently off Diovan and will restart a low dose, reassess microalbumin He will need regular followup for various chronic conditions Advised him to followup with cardiologist who should also be able to see him for primary care  Counseling time over 50% of today's 25 minute visit Calvin Perez 04/17/2013, 10:40 AM   Addendum: Labs done  Office Visit on 04/17/2013  Component Date Value Range Status  . Hemoglobin A1C 04/17/2013 10.2* 4.6 - 6.5 % Final   Glycemic Control Guidelines for People with Diabetes:Non Diabetic:  <6%Goal of Therapy: <7%Additional Action Suggested:  >8%   .  Sodium 04/17/2013 135  135 - 145 mEq/L Final  . Potassium 04/17/2013 4.4  3.5 - 5.1 mEq/L Final  . Chloride 04/17/2013 99  96 - 112 mEq/L Final  . CO2 04/17/2013 29  19 - 32 mEq/L Final  . Glucose, Bld 04/17/2013 226* 70 - 99 mg/dL Final  . BUN 16/07/9603 14  6 - 23 mg/dL Final  . Creatinine, Ser 04/17/2013 1.2  0.4 - 1.5 mg/dL  Final  . Total Bilirubin 04/17/2013 0.3  0.3 - 1.2 mg/dL Final  . Alkaline Phosphatase 04/17/2013 66  39 - 117 U/L Final  . AST 04/17/2013 29  0 - 37 U/L Final  . ALT 04/17/2013 35  0 - 53 U/L Final  . Total Protein 04/17/2013 7.7  6.0 - 8.3 g/dL Final  . Albumin 46/96/2952 3.7  3.5 - 5.2 g/dL Final  . Calcium 84/13/2440 9.8  8.4 - 10.5 mg/dL Final  . GFR 07/29/2535 62.38  >60.00 mL/min Final  . Color, Urine 04/17/2013 LT. YELLOW  Yellow;Lt. Yellow Final  . APPearance 04/17/2013 CLEAR  Clear Final  . Specific Gravity, Urine 04/17/2013 1.010  1.000-1.030 Final  . pH 04/17/2013 6.5  5.0 - 8.0 Final  . Total Protein, Urine 04/17/2013 NEGATIVE  Negative Final  . Urine Glucose 04/17/2013 NEGATIVE  Negative Final  . Ketones, ur 04/17/2013 NEGATIVE  Negative Final  . Bilirubin Urine 04/17/2013 NEGATIVE  Negative Final  . Hgb urine dipstick 04/17/2013 NEGATIVE  Negative Final  . Urobilinogen, UA 04/17/2013 0.2  0.0 - 1.0 Final  . Leukocytes, UA 04/17/2013 NEGATIVE  Negative Final  . Nitrite 04/17/2013 NEGATIVE  Negative Final  . WBC, UA 04/17/2013 none seen  0-2/hpf Final  . RBC / HPF 04/17/2013 none seen  0-2/hpf Final  . Squamous Epithelial / LPF 04/17/2013 Rare(0-4/hpf)  Rare(0-4/hpf) Final  . Microalb, Ur 04/17/2013 0.6  0.0 - 1.9 mg/dL Final  . Creatinine,U 64/40/3474 83.7   Final  . Microalb Creat Ratio 04/17/2013 0.7  0.0 - 30.0 mg/g Final  . Cholesterol 04/17/2013 141  0 - 200 mg/dL Final   ATP III Classification       Desirable:  < 200 mg/dL               Borderline High:  200 - 239 mg/dL          High:  > = 259 mg/dL  . Triglycerides 04/17/2013 130.0  0.0  - 149.0 mg/dL Final   Normal:  <563 mg/dLBorderline High:  150 - 199 mg/dL  . HDL 04/17/2013 29.10* >39.00 mg/dL Final  . VLDL 87/56/4332 26.0  0.0 - 40.0 mg/dL Final  . LDL Cholesterol 04/17/2013 86  0 - 99 mg/dL Final  . Total CHOL/HDL Ratio 04/17/2013 5   Final                  Men          Women1/2 Average Risk     3.4          3.3Average Risk          5.0          4.42X Average Risk          9.6          7.13X Average Risk          15.0          11.0

## 2013-04-18 ENCOUNTER — Other Ambulatory Visit (HOSPITAL_COMMUNITY): Payer: Self-pay | Admitting: Endocrinology

## 2013-04-18 ENCOUNTER — Telehealth: Payer: Self-pay | Admitting: *Deleted

## 2013-04-18 DIAGNOSIS — R05 Cough: Secondary | ICD-10-CM

## 2013-04-18 DIAGNOSIS — R1310 Dysphagia, unspecified: Secondary | ICD-10-CM

## 2013-04-18 NOTE — Telephone Encounter (Signed)
Allen Derry from [facility] called requesting permission to do a barium swallow test on Calvin Perez, she also said his bp resting was 162/80 and he's not on any bp meds, please advise CB [phone]

## 2013-04-19 NOTE — Telephone Encounter (Signed)
Barium swallow is okay, he is starting Diovan should be at his pharmacy

## 2013-04-21 DIAGNOSIS — E785 Hyperlipidemia, unspecified: Secondary | ICD-10-CM | POA: Insufficient documentation

## 2013-04-21 DIAGNOSIS — I10 Essential (primary) hypertension: Secondary | ICD-10-CM | POA: Insufficient documentation

## 2013-04-21 DIAGNOSIS — I639 Cerebral infarction, unspecified: Secondary | ICD-10-CM | POA: Insufficient documentation

## 2013-04-21 NOTE — Telephone Encounter (Signed)
Noted, nurse is aware. 

## 2013-04-23 ENCOUNTER — Ambulatory Visit (HOSPITAL_COMMUNITY)
Admission: RE | Admit: 2013-04-23 | Discharge: 2013-04-23 | Disposition: A | Discharge status: Home / Self Care | Payer: Medicare HMO | Source: Ambulatory Visit | Attending: Endocrinology | Admitting: Endocrinology

## 2013-04-23 DIAGNOSIS — R1312 Dysphagia, oropharyngeal phase: Secondary | ICD-10-CM | POA: Insufficient documentation

## 2013-04-23 DIAGNOSIS — Z794 Long term (current) use of insulin: Secondary | ICD-10-CM | POA: Insufficient documentation

## 2013-04-23 DIAGNOSIS — R1310 Dysphagia, unspecified: Secondary | ICD-10-CM

## 2013-04-23 DIAGNOSIS — I10 Essential (primary) hypertension: Secondary | ICD-10-CM | POA: Insufficient documentation

## 2013-04-23 DIAGNOSIS — T17808A Unspecified foreign body in other parts of respiratory tract causing other injury, initial encounter: Secondary | ICD-10-CM | POA: Insufficient documentation

## 2013-04-23 DIAGNOSIS — Z8673 Personal history of transient ischemic attack (TIA), and cerebral infarction without residual deficits: Secondary | ICD-10-CM | POA: Insufficient documentation

## 2013-04-23 DIAGNOSIS — T17408A Unspecified foreign body in trachea causing other injury, initial encounter: Secondary | ICD-10-CM | POA: Insufficient documentation

## 2013-04-23 DIAGNOSIS — IMO0002 Reserved for concepts with insufficient information to code with codable children: Secondary | ICD-10-CM | POA: Insufficient documentation

## 2013-04-23 DIAGNOSIS — E119 Type 2 diabetes mellitus without complications: Secondary | ICD-10-CM | POA: Insufficient documentation

## 2013-04-23 DIAGNOSIS — I2510 Atherosclerotic heart disease of native coronary artery without angina pectoris: Secondary | ICD-10-CM | POA: Insufficient documentation

## 2013-04-23 DIAGNOSIS — R05 Cough: Secondary | ICD-10-CM

## 2013-04-23 DIAGNOSIS — R1313 Dysphagia, pharyngeal phase: Secondary | ICD-10-CM | POA: Insufficient documentation

## 2013-04-23 DIAGNOSIS — E785 Hyperlipidemia, unspecified: Secondary | ICD-10-CM | POA: Insufficient documentation

## 2013-04-23 NOTE — Procedures (Addendum)
Objective Swallowing Evaluation: Modified Barium Swallowing Study  Patient Details  Name: Calvin Perez MRN: [id number] Date of Birth: Oct 16, 1940  Today's Date: 04/23/2013 Time: 7829-5621 SLP Time Calculation (min): 50 min  Past Medical History:  Past Medical History  Diagnosis Date  . Hypertension   . Arthritis   . Dyslipidemia   . Coronary artery disease   . Diabetes mellitus     insulin dependent   Past Surgical History:  Past Surgical History  Procedure Laterality Date  . Open heart surgery  1998  . Coronary artery bypass graft    . Cardiac catheterization     HPI:  Pt is a 72 year old male who arrives for an outpatient MBS due to complaints of coughing primarily with liquids. Pt had a CVA in April 01, 2013 during visit to [place] with resulting dysarthria, dysphagia and mild right sided weakness. The pts wife has some experience in nursing homes and has started thickening his liquids with yogurt to eliminate his cough. He is observed to have breathy vocal quality and wife reports this is new since CVA as well. They deny any recent colds or signs of pna. They also report a single visit from a home health SLP who recommended precautions, but did not return, per wife.  Investigation into records also reveals left pontine infarct in 2013.     Assessment / Plan / Recommendation Clinical Impression  Dysphagia Diagnosis: Moderate pharyngeal phase dysphagia Clinical impression: Pt demonstrates appearance of a moderate sensory motor oropharyngeal dysphagia with silent aspiration of thin liquids. Sensory deficits lead to a delay in swallow initiation with thin liquids falling directly into airway before the swallow with 25% of trials. Pt senses only very significant aspirate. Even cued coughs are ineffective in clearing aspirate. Suspect decreased adduction of vocal folds making pt unable to produce explosive, effective cough. Pts altered vocal quality also indicates possible vocal fold  impairment. With nectar and honey thick liquids delay is improved but trace penetrate still occurs due decreased airway closure. Chin tucks, supraglottic swallow (attempted) and head turns did not improve airway closure.   Pt is recommended to consume nectar thick liquids with regular solids with moderate aspiration risk. SLP instructed wife in thickening liquids with thickener as this is likely to be less runny and safer than thickening with yogurt. Also recommend f/u with outpatient SLP (for dysphagia and dysarthria) and also consideration for referal to ENT to evaluate pts vocal fold function. Treatment with ENT and SLP may improve pts swallow function.     Treatment Recommendation  Defer treatment plan to SLP at (Comment) (Outpatient SLP)    Diet Recommendation Regular;Nectar-thick liquid   Liquid Administration via: Cup;No straw Medication Administration: Whole meds with puree Supervision: Patient able to self feed;Full supervision/cueing for compensatory strategies Compensations: Slow rate;Small sips/bites;Clear throat intermittently;Multiple dry swallows after each bite/sip Postural Changes and/or Swallow Maneuvers: Seated upright 90 degrees    Other  Recommendations Recommended Consults: Consider ENT evaluation Oral Care Recommendations: Patient independent with oral care   Follow Up Recommendations  Outpatient SLP    Frequency and Duration        Pertinent Vitals/Pain NA    SLP Swallow Goals     General HPI: Pt is a 72 year old male who arrives for an outpatient MBS due to complaints of coughing primarily with liquids. Pt had a CVA in April 01, 2013 during visit to [place] with resulting dysarthria, dysphagia and mild right sided weakness. The pts wife  has some experience in nursing homes and has started thickening his liquids with yogurt to eliminate his cough. He is observed to have breathy vocal quality and wife reports this is new since CVA as well. They deny any recent  colds or signs of pna. They also report a single visit from a home health SLP who recommended precautions, but did not return, per wife.  Type of Study: Modified Barium Swallowing Study Reason for Referral: Objectively evaluate swallowing function Previous Swallow Assessment: unknown, possibly in [place] Diet Prior to this Study: Regular;Nectar-thick liquids Temperature Spikes Noted: N/A Respiratory Status: Room air History of Recent Intubation: No Behavior/Cognition: Alert;Cooperative;Pleasant mood Oral Cavity - Dentition: Adequate natural dentition Oral Motor / Sensory Function: Impaired motor;Impaired sensory Oral impairment: Right lingual;Right facial;Right labial Self-Feeding Abilities: Able to feed self Patient Positioning: Upright in chair Baseline Vocal Quality: Hoarse;Breathy Volitional Cough: Weak Volitional Swallow: Able to elicit Anatomy:  (Appearance of bony protrusion anteriorally at level of C4/5 ) Pharyngeal Secretions: Not observed secondary MBS    Reason for Referral Objectively evaluate swallowing function   Oral Phase Oral Preparation/Oral Phase Oral Phase: WFL   Pharyngeal Phase Pharyngeal Phase Pharyngeal Phase: Impaired Pharyngeal - Honey Pharyngeal - Honey Cup: Reduced airway/laryngeal closure;Penetration/Aspiration during swallow;Delayed swallow initiation;Premature spillage to valleculae Penetration/Aspiration details (honey cup): Material enters airway, remains ABOVE vocal cords and not ejected out Pharyngeal - Nectar Pharyngeal - Nectar Teaspoon: Premature spillage to valleculae;Reduced airway/laryngeal closure;Penetration/Aspiration during swallow Penetration/Aspiration details (nectar teaspoon): Material enters airway, remains ABOVE vocal cords and not ejected out;Material enters airway, CONTACTS cords and not ejected out Pharyngeal - Nectar Cup: Delayed swallow initiation;Reduced airway/laryngeal closure;Penetration/Aspiration during  swallow Penetration/Aspiration details (nectar cup): Material does not enter airway;Material enters airway, remains ABOVE vocal cords and not ejected out;Material enters airway, CONTACTS cords and not ejected out Pharyngeal - Thin Pharyngeal - Thin Cup: Delayed swallow initiation;Penetration/Aspiration before swallow;Moderate aspiration Penetration/Aspiration details (thin cup): Material enters airway, passes BELOW cords without attempt by patient to eject out (silent aspiration);Material enters airway, passes BELOW cords and not ejected out despite cough attempt by patient;Material does not enter airway Pharyngeal - Solids Pharyngeal - Puree: Within functional limits Pharyngeal - Regular: Within functional limits Pharyngeal - Pill: Within functional limits  Cervical Esophageal Phase    GO    Cervical Esophageal Phase Cervical Esophageal Phase: Impaired Cervical Esophageal Phase - Comment Cervical Esophageal Comment: Appearance of bony protrusion at level of C4/5 diverts and delays passage of bolus through UES, but does not functionally impact swallow.     Functional Assessment Tool Used: clinical judgement Functional Limitations: Swallowing Swallow Current Status (J4782): At least 40 percent but less than 60 percent impaired, limited or restricted Swallow Goal Status [phone]): At least 40 percent but less than 60 percent impaired, limited or restricted Swallow Discharge Status [phone]): At least 40 percent but less than 60 percent impaired, limited or restricted   [facility], MA CCC-SLP [phone]  Claudine Mouton 04/23/2013, 12:28 PM

## 2013-04-28 ENCOUNTER — Telehealth: Payer: Self-pay | Admitting: *Deleted

## 2013-04-28 NOTE — Telephone Encounter (Signed)
Home health nurse is requesting a referral for pt to see an ENT due to his swallow study,

## 2013-04-28 NOTE — Telephone Encounter (Signed)
Nash Dimmer Physical Therapist  Is requesting a referral to Neuro-rehab  [address], fax # [phone], phone # [phone] Mr. Calvin Perez

## 2013-04-30 ENCOUNTER — Ambulatory Visit (INDEPENDENT_AMBULATORY_CARE_PROVIDER_SITE_OTHER): Payer: Medicare HMO | Admitting: Neurology

## 2013-04-30 ENCOUNTER — Encounter: Payer: Self-pay | Admitting: Neurology

## 2013-04-30 VITALS — BP 137/73 | HR 74 | Ht 65.0 in | Wt 175.0 lb

## 2013-04-30 DIAGNOSIS — I6350 Cerebral infarction due to unspecified occlusion or stenosis of unspecified cerebral artery: Secondary | ICD-10-CM

## 2013-04-30 NOTE — Progress Notes (Signed)
[facility] [address] [place]. [place] [address] [phone]       OFFICE CONSULT NOTE  Calvin. Calvin Perez Date of Birth:  November 21, 1940 Medical Record Number:  [id number]   Referring MD:  Orpah Cobb, MD Reason for Referral:  Stroke HPI: Calvin Perez is a 72 year Asian [place] male who recently had a stroke in [place] on 04/01/13. He developed unsteady gait for a day before he sought medical help. The next day noticed he was unable to get up without help and had slurred speech, right facial droop and difficulty swallowing and right leg weakness and clumsiness. He has previously been on aspirin which he had stopped several months ago when he returned from [place]. He was admitted to [facility] for 8 days in [place]. I do not have any of the records from there to review today. The patient was started on aspirin and referred for outpatient physical and occupational therapy which is presently getting. He is able to walk independently but has still mild dragging of his right leg. He has had episodes of inappropriate crying while talking. He denies feeling depressed. The patient has a previous history of right internal carotid artery stent in 1998 but is unable to tell me any details of followup and patency of the stents. Review of his imaging records in [facility] show an MRI scan done on 02/01/2012 showing a left paramedian pontine infarct which was acute as well as several old lacunar infarcts of remote age in basal ganglia and subcortical regions. Patient, his wife and granddaughter were present during this visit are unable to recall recall any hospitalizations or neurological deficits in May 2013 that he had. He does have history of diabetes and hyperlipidemia which he states is well controlled though he admits to not being on medications for several months prior to his stroke. ROS:   14 system review of systems is positive for emotional lability, crying  excessively, disinterest in activities, cough, memory loss, walking difficulty  PMH:  Past Medical History  Diagnosis Date  . Hypertension   . Arthritis   . Dyslipidemia   . Coronary artery disease   . Diabetes mellitus     insulin dependent    Social History:  History   Social History  . Marital Status: Married    Spouse Name: N/A    Number of Children: 2  . Years of Education: 12   Occupational History  . retired    Social History Main Topics  . Smoking status: Never Smoker   . Smokeless tobacco: Never Used  . Alcohol Use: No  . Drug Use: No  . Sexually Active: Not Currently   Other Topics Concern  . Not on file   Social History Narrative  . No narrative on file    Medications:   Current Outpatient Prescriptions on File Prior to Visit  Medication Sig Dispense Refill  . aspirin 81 MG tablet Take 81 mg by mouth daily.      Marland Kitchen atorvastatin (LIPITOR) 80 MG tablet Take 80 mg by mouth daily.      . clopidogrel (PLAVIX) 75 MG tablet Take 75 mg by mouth daily.      . insulin glargine (LANTUS) 100 UNIT/ML injection Inject 70 Units into the skin daily with breakfast.       . insulin glulisine (APIDRA) 100 UNIT/ML injection Inject 20 Units into the skin 3 (three) times daily before meals. 20 units at breakfast,  55 units at lunch, 40 units at supper      . valsartan (DIOVAN) 80 MG tablet Take 1 tablet (80 mg total) by mouth daily.  30 tablet  2   No current facility-administered medications on file prior to visit.    Allergies:  No Known Allergies  Physical Exam General: well developed, well nourished, seated, in no evident distress Head: head normocephalic and atraumatic. Orohparynx benign Neck: supple with no carotid or supraclavicular bruits Cardiovascular: regular rate and rhythm, no murmurs Musculoskeletal: no deformity Skin:  no rash/petichiae Vascular:  Normal pulses all extremities Filed Vitals:   04/30/13 1302  BP: 137/73  Pulse: 74    Neurologic  Exam Mental Status: Awake and fully alert. Oriented to place and time. Recent and remote memory intact. Attention span, concentration and fund of knowledge diminished. Mood and affect appropriate.No emotional lability seen during this visit. Mini-Mental status exam scored 21/30 with deficits in orientation, attention and recall. Animal naming test 6 only. Clock drawing 4/4. Cranial Nerves: Fundoscopic exam reveals sharp disc margins. Pupils equal, briskly reactive to light. Extraocular movements full without nystagmus. Visual fields full to confrontation. Hearing intact. Facial sensation intact. Face, tongue, palate moves normally and symmetrically.  Motor: Normal bulk and tone. Normal strength in all tested extremity muscles. Diminished fine finger movements on the right. Orbits left over right upper extremity. Mild weakness of right hip flexor and ankle dorsiflexors. Sensory.: intact to touch and pinprick  But diminished vibratory sensation on ankles and toes bilaterally...  Coordination: Rapid alternating movements normal in all extremities. Finger-to-nose and heel-to-shin performed accurately bilaterally. Gait and Station: Arises from chair without difficulty. Stance is normal. Gait demonstrates mild stiffness and dragging of the right leg and slight unsteadiness when he turns and first gets up. Unable to walk tandem without difficulty  Reflexes: 1+ and symmetric. Toes downgoing.   NIHSS 1 Modified Rankin  2  ASSESSMENT:  72 year old male with suspect recent right brain infarct one month ago in [place] city of unclear etiology as  I am missing all his records from that hospitalization. Prior history of left pontine infarct in May 2013 as well as silent bilateral subcortical lacunar infarcts. Vascular risk factors of diabetes, hypertension, hyperlipidemia and cerebrovascular disease. 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 Slight pseudobulbar affect but patient does not  want medications for it at the present time.  PLAN: Discontinue aspirin and stay on Plavix alone for secondary stroke prevention. Strict control of diabetes with hemoglobin A1c goal below 6.5%, lipids with LDL cholesterol goal below 70 mg percent and hypertension with blood pressure goal below 120/80. Try to obtain records from [facility] in [place] .Continue outpatient physical, occupational and speech therapies. Check MRI scan of the brain, MRAs, carotid transcranial Doppler studies. Return for followup in 2 months with Levester Fresh, NP

## 2013-04-30 NOTE — Patient Instructions (Addendum)
Discontinue aspirin and stay on Plavix alone for secondary stroke prevention. Strict control of diabetes with hemoglobin A1c goal below 6.5%, lipids with LDL cholesterol goal below 70 mg percent and hypertension with blood pressure goal below 120/80. Try to obtain his records from [facility] in [place] city.. Continue outpatient physical, occupational and speech therapies. Check MRI scan of the brain, MRAs, carotid transcranial Doppler studies. Return for followup in 2 months with Levester Fresh, NP

## 2013-05-01 ENCOUNTER — Other Ambulatory Visit: Payer: Self-pay | Admitting: *Deleted

## 2013-05-01 MED ORDER — ATORVASTATIN CALCIUM 80 MG PO TABS: 80.0000 mg | ORAL_TABLET | Freq: Every day | ORAL | Status: DC

## 2013-05-05 ENCOUNTER — Telehealth: Payer: Self-pay | Admitting: *Deleted

## 2013-05-05 ENCOUNTER — Other Ambulatory Visit: Payer: Self-pay | Admitting: Endocrinology

## 2013-05-05 DIAGNOSIS — J383 Other diseases of vocal cords: Secondary | ICD-10-CM

## 2013-05-05 NOTE — Telephone Encounter (Signed)
Patients daughter called, she said after talking to her dad's insurance company they supposedly told her that his PCP had to make the referral and is still pushing for this to be done to the ENT.

## 2013-05-05 NOTE — Telephone Encounter (Signed)
Completed. Also if there are any further referrals they can be done by his neurologist

## 2013-05-07 ENCOUNTER — Other Ambulatory Visit: Payer: Self-pay

## 2013-05-13 ENCOUNTER — Other Ambulatory Visit: Payer: Medicare HMO

## 2013-05-14 ENCOUNTER — Ambulatory Visit: Payer: Medicare HMO

## 2013-05-14 ENCOUNTER — Ambulatory Visit: Payer: Medicare HMO | Attending: Cardiovascular Disease

## 2013-05-14 ENCOUNTER — Ambulatory Visit: Payer: Medicare HMO | Admitting: Occupational Therapy

## 2013-05-14 DIAGNOSIS — R279 Unspecified lack of coordination: Secondary | ICD-10-CM | POA: Diagnosis not present

## 2013-05-14 DIAGNOSIS — IMO0001 Reserved for inherently not codable concepts without codable children: Secondary | ICD-10-CM | POA: Diagnosis not present

## 2013-05-14 DIAGNOSIS — R262 Difficulty in walking, not elsewhere classified: Secondary | ICD-10-CM | POA: Insufficient documentation

## 2013-05-15 ENCOUNTER — Ambulatory Visit (INDEPENDENT_AMBULATORY_CARE_PROVIDER_SITE_OTHER): Payer: Medicare HMO

## 2013-05-15 DIAGNOSIS — I6350 Cerebral infarction due to unspecified occlusion or stenosis of unspecified cerebral artery: Secondary | ICD-10-CM

## 2013-05-15 DIAGNOSIS — Z0289 Encounter for other administrative examinations: Secondary | ICD-10-CM

## 2013-05-16 ENCOUNTER — Ambulatory Visit: Payer: Medicare HMO

## 2013-05-20 ENCOUNTER — Ambulatory Visit: Payer: Medicare HMO

## 2013-05-22 ENCOUNTER — Ambulatory Visit: Payer: Medicare HMO

## 2013-05-22 DIAGNOSIS — IMO0001 Reserved for inherently not codable concepts without codable children: Secondary | ICD-10-CM | POA: Diagnosis not present

## 2013-05-26 ENCOUNTER — Ambulatory Visit: Payer: Medicare HMO | Admitting: Physical Therapy

## 2013-05-26 DIAGNOSIS — IMO0001 Reserved for inherently not codable concepts without codable children: Secondary | ICD-10-CM | POA: Diagnosis not present

## 2013-05-27 ENCOUNTER — Ambulatory Visit: Payer: Medicare HMO

## 2013-05-28 ENCOUNTER — Ambulatory Visit: Payer: Medicare HMO

## 2013-05-28 ENCOUNTER — Ambulatory Visit: Payer: Medicare HMO | Admitting: Speech Pathology

## 2013-05-28 DIAGNOSIS — IMO0001 Reserved for inherently not codable concepts without codable children: Secondary | ICD-10-CM | POA: Diagnosis not present

## 2013-06-03 ENCOUNTER — Ambulatory Visit: Payer: Medicare HMO | Admitting: Occupational Therapy

## 2013-06-03 ENCOUNTER — Ambulatory Visit: Payer: Medicare HMO | Attending: Cardiovascular Disease | Admitting: Physical Therapy

## 2013-06-03 DIAGNOSIS — IMO0001 Reserved for inherently not codable concepts without codable children: Secondary | ICD-10-CM | POA: Insufficient documentation

## 2013-06-03 DIAGNOSIS — R279 Unspecified lack of coordination: Secondary | ICD-10-CM | POA: Insufficient documentation

## 2013-06-03 DIAGNOSIS — R262 Difficulty in walking, not elsewhere classified: Secondary | ICD-10-CM | POA: Insufficient documentation

## 2013-06-05 ENCOUNTER — Ambulatory Visit: Payer: Medicare HMO | Admitting: Occupational Therapy

## 2013-06-05 ENCOUNTER — Ambulatory Visit: Payer: Medicare HMO

## 2013-06-09 ENCOUNTER — Ambulatory Visit: Payer: Medicare HMO

## 2013-06-09 ENCOUNTER — Ambulatory Visit: Payer: Medicare HMO | Admitting: Speech Pathology

## 2013-06-09 ENCOUNTER — Ambulatory Visit: Payer: Medicare HMO | Admitting: Occupational Therapy

## 2013-06-11 ENCOUNTER — Ambulatory Visit: Payer: Medicare HMO | Admitting: Occupational Therapy

## 2013-06-11 ENCOUNTER — Ambulatory Visit: Payer: Medicare HMO

## 2013-06-11 ENCOUNTER — Ambulatory Visit: Payer: Medicare HMO | Admitting: Speech Pathology

## 2013-06-16 ENCOUNTER — Ambulatory Visit: Payer: Medicare HMO

## 2013-06-16 ENCOUNTER — Ambulatory Visit: Payer: Medicare HMO | Admitting: Speech Pathology

## 2013-06-16 ENCOUNTER — Encounter: Payer: Self-pay | Admitting: Occupational Therapy

## 2013-06-18 ENCOUNTER — Ambulatory Visit: Payer: Medicare HMO | Admitting: Physical Therapy

## 2013-06-18 ENCOUNTER — Ambulatory Visit: Payer: Medicare HMO | Admitting: Speech Pathology

## 2013-06-18 ENCOUNTER — Encounter: Payer: Self-pay | Admitting: Occupational Therapy

## 2013-06-19 ENCOUNTER — Other Ambulatory Visit (INDEPENDENT_AMBULATORY_CARE_PROVIDER_SITE_OTHER): Payer: Medicare HMO

## 2013-06-19 ENCOUNTER — Other Ambulatory Visit: Payer: Self-pay | Admitting: *Deleted

## 2013-06-19 DIAGNOSIS — E785 Hyperlipidemia, unspecified: Secondary | ICD-10-CM

## 2013-06-19 DIAGNOSIS — IMO0001 Reserved for inherently not codable concepts without codable children: Secondary | ICD-10-CM

## 2013-06-19 LAB — COMPREHENSIVE METABOLIC PANEL
ALT: 21 U/L (ref 0–53)
AST: 22 U/L (ref 0–37)
Albumin: 4.4 g/dL (ref 3.5–5.2)
CO2: 27 mEq/L (ref 19–32)
Calcium: 9.8 mg/dL (ref 8.4–10.5)
Chloride: 102 mEq/L (ref 96–112)
Creatinine, Ser: 1.1 mg/dL (ref 0.4–1.5)
GFR: 66.79 mL/min (ref >60.00)
Potassium: 4.2 mEq/L (ref 3.5–5.1)

## 2013-06-19 LAB — URINALYSIS
Bilirubin Urine: NEGATIVE
Ketones, ur: NEGATIVE
Leukocytes, UA: NEGATIVE
Urine Glucose: 100
Urobilinogen, UA: 0.2 (ref 0.0–1.0)

## 2013-06-19 LAB — LDL CHOLESTEROL, DIRECT: Direct LDL: 164.6 mg/dL

## 2013-06-19 LAB — HEMOGLOBIN A1C: Hgb A1c MFr Bld: 9.5 % — ABNORMAL HIGH (ref 4.6–6.5)

## 2013-06-19 LAB — MICROALBUMIN / CREATININE URINE RATIO: Microalb Creat Ratio: 2.4 mg/g (ref 0.0–30.0)

## 2013-06-19 LAB — LIPID PANEL: HDL: 41.6 mg/dL (ref >39.00)

## 2013-06-23 ENCOUNTER — Encounter: Payer: Self-pay | Admitting: Endocrinology

## 2013-06-23 ENCOUNTER — Ambulatory Visit (INDEPENDENT_AMBULATORY_CARE_PROVIDER_SITE_OTHER): Payer: Medicare HMO | Admitting: Endocrinology

## 2013-06-23 ENCOUNTER — Encounter: Payer: Self-pay | Admitting: Occupational Therapy

## 2013-06-23 VITALS — BP 134/80 | HR 77 | Temp 97.9°F | Ht 66.0 in | Wt 170.3 lb

## 2013-06-23 DIAGNOSIS — I10 Essential (primary) hypertension: Secondary | ICD-10-CM

## 2013-06-23 DIAGNOSIS — E785 Hyperlipidemia, unspecified: Secondary | ICD-10-CM

## 2013-06-23 MED ORDER — COLESEVELAM HCL 3.75 G PO PACK: 3.7500 g | PACK | Freq: Every day | ORAL | Status: DC

## 2013-06-23 MED ORDER — INSULIN LISPRO PROT & LISPRO (50-50 MIX) 100 UNIT/ML KWIKPEN: 45.0000 [IU] | PEN_INJECTOR | Freq: Three times a day (TID) | SUBCUTANEOUS | Status: DC

## 2013-06-23 NOTE — Patient Instructions (Addendum)
Humalog 50/50 insulin 45  before breakfast 45 before lunch and 35 before dinner  Start Welchol 1 pkt daily  Please check blood sugars at least half the time about 2 hours after any meal and as directed on waking up. Please bring blood sugar monitor to each visit  Check to see if you are on Lisinopril

## 2013-06-23 NOTE — Progress Notes (Addendum)
Patient ID: Calvin Perez, male   DOB: [birth year], 72 y.o.   MRN: [id number]  Reason for Appointment: Diabetes follow-up   History of Present Illness  Diagnosis: Type 2 DIABETES MELITUS, since 1984       Past history:  He has been on insulin regimen since about 2004. He has required large doses of insulin insulin persistently but with inadequate control usually Had been relatively intolerant to metformin and did not want to pay for Actos His hemoglobin A1c in the past has usually been about 8-9%  He usually has problems with  postprandial hyperglycemia despite taking large dose of mealtime insulin  RECENT history: His A1c is significantly high and this may be mostly because of postprandial hyperglycemia since his fasting readings are averaging only 143. His only to nonfasting readings were over 250 Also despite reminders he has not been able to  check his readings after meals.Also has difficulty losing weight    Oral hypoglycemic drugs: None at present        Side effects from medications: ? GI side effects from metformin  Insulin regimen: Lantus 70 units at bedtime.  Apdra before meals 20 -25-20 units          Proper timing of medications in relation to meals: may forget  Mealtime insulin occasionally, rarely forgetting Lantus at night Monitors blood glucose: Once a day.    Glucometer: One Touch.          Blood Glucose readings:  median 126 with morning range 64-306, nonfasting 257, 350 last month Hypoglycemia frequency:  minimal.          Meals: 3 meals per day.     he is generally eating vegetarian meals and does not get much protein with meals        Physical activity: exercise: Trying to walk frequently and going to the [facility] also           Complications: are: Atherosclerotic vascular disease, recent stroke     HYPERTENSION:  he has previously been on multiple drugs including labetalol and Diovan but not clear if he was also given lisinopril by an unknown PCP in addition to the Diovan  started on the last visit. Blood pressure is excellent today  Does not monitor blood pressure at home  HYPERLIPIDEMIA:         The lipid abnormality consists of elevated LDL and this has been generally difficult to control with various drugs in the past probably because of noncompliance with medications and financial reasons.  He thinks he is taking his Lipitor but cholesterol is higher than before, likely to be noncompliant with the medication    Medication List       This list is accurate as of: 06/23/13 11:59 PM.  Always use your most recent med list.               aspirin 81 MG tablet  Take 81 mg by mouth daily.     atorvastatin 80 MG tablet  Commonly known as:  LIPITOR  Take 1 tablet (80 mg total) by mouth daily.     clopidogrel 75 MG tablet  Commonly known as:  PLAVIX  Take 75 mg by mouth daily.     Colesevelam HCl 3.75 G Pack  Take 1 packet (3.8 g total) by mouth daily.     insulin glargine 100 UNIT/ML injection  Commonly known as:  LANTUS  Inject 70 Units into the skin daily with breakfast.  insulin glulisine 100 UNIT/ML injection  Commonly known as:  APIDRA  Inject 20 Units into the skin 3 (three) times daily before meals. 20 units at breakfast, 55 units at lunch, 40 units at supper     Insulin Lispro Prot & Lispro (50-50) 100 UNIT/ML Supn  Commonly known as:  HUMALOG MIX 50/50 KWIKPEN  Inject 45 Units into the skin 3 (three) times daily before meals.     lisinopril 20 MG tablet  Commonly known as:  PRINIVIL,ZESTRIL  20 mg. Take 1 tablet (20 mg total) by mouth daily.     valsartan 80 MG tablet  Commonly known as:  DIOVAN  Take 1 tablet (80 mg total) by mouth daily.     ZOLOFT 50 MG tablet  Generic drug:  sertraline  50 mg. Take 1 tablet (50 mg total) by mouth daily.        Allergies: No Known Allergies  Past Medical History  Diagnosis Date  . Hypertension   . Arthritis   . Dyslipidemia   . Coronary artery disease   . Diabetes mellitus      insulin dependent    Past Surgical History  Procedure Laterality Date  . Open heart surgery  1998  . Coronary artery bypass graft    . Cardiac catheterization      History reviewed. No pertinent family history.  Social History:  reports that he has never smoked. He has never used smokeless tobacco. He reports that he does not drink alcohol or use illicit drugs.  Review of Systems - Cardiovascular ROS: positive for - coronary artery disease, cerebrovascular disease No symptoms of numbness or tingling in his feet recently He  had a stroke with a right hemiparesis and speech difficulty but is improving now. Has finished physical therapy He has had significant anemia but not clear this has been evaluated, is going to see PCP   Examination:   BP 134/80  Pulse 77  Temp(Src) 97.9 F (36.6 C) (Oral)  Ht 5\' 6"  (1.676 m)  Wt 170 lb 4.8 oz (77.248 kg)  BMI 27.5 kg/m2  SpO2 98%  Body mass index is 27.5 kg/(m^2).   Speech is normal No pedal edema  Assesment/PLAN:   1. Diabetes type 2, uncontrolled - 250.02  The patient's diabetes control appears to be relatively worse with A1c 9.5%. Also appears to be poorly compliant with blood sugar monitoring nonfasting and these readings are high whenever checked. Most likely has significant need for better postprandial control which he is not able to do with current insulin. Also because of cost he may not be taking his mealtime insulin consistently To improve compliance and cost reduction will try him on 50/50 insulin before each meal which will provide better mealtime control Discussed timing of this insulin, composition, actions of the 2 components, but sugar targets, glucose monitoring timings, potential for hypoglycemia, balanced diet Encouraged him to continue his exercise  HYPERCHOLESTEROLEMIA: He has been on generic Lipitor and his lipids are still poorly controlled, he needs to do better with compliance with medications and also will add  WelChol for multiple benefits. Would like to see LDL 70, currently 164  Hypertension: Currently on Diovan and not clear if he has started lisinopril from another physician  Counseling time over 50% of today's 25 minute visit   Calvin Perez 06/24/2013, 10:30 AM   Addendum: Labs done  Appointment on 06/19/2013  Component Date Value Range Status  . Hemoglobin A1C 06/19/2013 9.5* 4.6 - 6.5 % Final  Glycemic Control Guidelines for People with Diabetes:Non Diabetic:  <6%Goal of Therapy: <7%Additional Action Suggested:  >8%   . Color, Urine 06/19/2013 LT. YELLOW  Yellow;Lt. Yellow Final  . APPearance 06/19/2013 CLEAR  Clear Final  . Specific Gravity, Urine 06/19/2013 1.020  1.000-1.030 Final  . pH 06/19/2013 6.5  5.0 - 8.0 Final  . Total Protein, Urine 06/19/2013 NEGATIVE  Negative Final  . Urine Glucose 06/19/2013 100  Negative Final  . Ketones, ur 06/19/2013 NEGATIVE  Negative Final  . Bilirubin Urine 06/19/2013 NEGATIVE  Negative Final  . Hgb urine dipstick 06/19/2013 NEGATIVE  Negative Final  . Urobilinogen, UA 06/19/2013 0.2  0.0 - 1.0 Final  . Leukocytes, UA 06/19/2013 NEGATIVE  Negative Final  . Nitrite 06/19/2013 NEGATIVE  Negative Final  . Microalb, Ur 06/19/2013 2.8* 0.0 - 1.9 mg/dL Final  . Creatinine,U 40/98/1191 116.9   Final  . Microalb Creat Ratio 06/19/2013 2.4  0.0 - 30.0 mg/g Final  . Sodium 06/19/2013 137  135 - 145 mEq/L Final  . Potassium 06/19/2013 4.2  3.5 - 5.1 mEq/L Final  . Chloride 06/19/2013 102  96 - 112 mEq/L Final  . CO2 06/19/2013 27  19 - 32 mEq/L Final  . Glucose, Bld 06/19/2013 149* 70 - 99 mg/dL Final  . BUN 47/82/9562 20  6 - 23 mg/dL Final  . Creatinine, Ser 06/19/2013 1.1  0.4 - 1.5 mg/dL Final  . Total Bilirubin 06/19/2013 0.5  0.3 - 1.2 mg/dL Final  . Alkaline Phosphatase 06/19/2013 67  39 - 117 U/L Final  . AST 06/19/2013 22  0 - 37 U/L Final  . ALT 06/19/2013 21  0 - 53 U/L Final  . Total Protein 06/19/2013 8.3  6.0 - 8.3 g/dL Final  .  Albumin 13/05/6577 4.4  3.5 - 5.2 g/dL Final  . Calcium 46/96/2952 9.8  8.4 - 10.5 mg/dL Final  . GFR 84/13/2440 66.79  >60.00 mL/min Final  . Cholesterol 06/19/2013 217* 0 - 200 mg/dL Final   ATP III Classification       Desirable:  < 200 mg/dL               Borderline High:  200 - 239 mg/dL          High:  > = 102 mg/dL  . Triglycerides 06/19/2013 120.0  0.0 - 149.0 mg/dL Final   Normal:  <725 mg/dLBorderline High:  150 - 199 mg/dL  . HDL 06/19/2013 41.60  >39.00 mg/dL Final  . VLDL 36/64/4034 24.0  0.0 - 40.0 mg/dL Final  . Total CHOL/HDL Ratio 06/19/2013 5   Final                  Men          Women1/2 Average Risk     3.4          3.3Average Risk          5.0          4.42X Average Risk          9.6          7.13X Average Risk          15.0          11.0                      . Direct LDL 06/19/2013 164.6   Final   Optimal:  <100 mg/dLNear or Above Optimal:  100-129 mg/dLBorderline High:  130-159 mg/dLHigh:  160-189 mg/dLVery High:  >190 mg/dL

## 2013-06-25 ENCOUNTER — Encounter: Payer: Self-pay | Admitting: Occupational Therapy

## 2013-07-01 ENCOUNTER — Encounter: Payer: Self-pay | Admitting: Nurse Practitioner

## 2013-07-01 ENCOUNTER — Ambulatory Visit (INDEPENDENT_AMBULATORY_CARE_PROVIDER_SITE_OTHER): Payer: Medicare HMO | Admitting: Nurse Practitioner

## 2013-07-01 ENCOUNTER — Telehealth: Payer: Self-pay | Admitting: *Deleted

## 2013-07-01 VITALS — BP 144/73 | HR 78 | Temp 97.9°F | Ht 65.5 in | Wt 171.0 lb

## 2013-07-01 DIAGNOSIS — I6350 Cerebral infarction due to unspecified occlusion or stenosis of unspecified cerebral artery: Secondary | ICD-10-CM

## 2013-07-01 DIAGNOSIS — F482 Pseudobulbar affect: Secondary | ICD-10-CM

## 2013-07-01 MED ORDER — DEXTROMETHORPHAN-QUINIDINE 20-10 MG PO CAPS: 1.0000 | ORAL_CAPSULE | Freq: Two times a day (BID) | ORAL | Status: DC

## 2013-07-01 NOTE — Telephone Encounter (Signed)
Which one? He needs to get patient assistance from the manufacturers, there are very few alternatives

## 2013-07-01 NOTE — Telephone Encounter (Signed)
Patients daughter called, she said the new medications you prescribed for her dad were very expensive, she wanted to see if there was anything cheaper he could go to? Daughters CB # [phone]

## 2013-07-01 NOTE — Progress Notes (Signed)
[facility]  PATIENT: Calvin Perez DOB: 09-Jan-1941   HISTORY FROM: patient, chart REASON FOR VISIT: routine follow up  HISTORY OF PRESENT ILLNESS:  Calvin Perez is a 72 year Asian [place] male who recently had a stroke in [place] on 04/01/13. He developed unsteady gait for a day before he sought medical help. The next day noticed he was unable to get up without help and had slurred speech, right facial droop and difficulty swallowing and right leg weakness and clumsiness. He has previously been on aspirin which he had stopped several months ago when he returned from [place]. He was admitted to [facility] for 8 days in [place]. I do not have any of the records from there to review today. The patient was started on aspirin and referred for outpatient physical and occupational therapy which is presently getting. He is able to walk independently but has still mild dragging of his right leg. He has had episodes of inappropriate crying while talking. He denies feeling depressed. The patient has a previous history of right internal carotid artery stent in 1998 but is unable to tell me any details of followup and patency of the stents. Review of his imaging records in [place] [facility] show an MRI scan done on 02/01/2012 showing a left paramedian pontine infarct which was acute as well as several old lacunar infarcts of remote age in basal ganglia and subcortical regions. Patient, his wife and granddaughter were present during this visit are unable to recall recall any hospitalizations or neurological deficits in May 2013 that he had. He does have history of diabetes and hyperlipidemia which he states is well controlled though he admits to not being on medications for several months prior to his stroke.   UPDATE 07/01/13 (LL):  Calvin Perez returns for stroke follow up.  He was seen by Dr. Haroldine Laws for swallowing difficulty who recommended continuing with thickened  liquids.  Water especially makes him cough.  He is taking aspirin and Plavix for secondary stroke prevention. Patient denies medication side effects, with no signs of bleeding or excessive bruising.  He has finished therapy and has joined [employer] and walks, and works out on [employer].  He still has episodes of inappropriate crying while talking. He denies feeling depressed. He is more embarrassed by this.  He was tried on Zoloft from his PCP, but he says it made him very drowsy and hungry all the time.  He stopped taking it.    REVIEW OF SYSTEMS: Full 14 system review of systems performed and notable only for: constitutional: N/A  cardiovascular: N/A respiratory: cough endocrine: N/A  ear/nose/throat: trouble swallowing water  Hematology/Lymph: easy bleeding, easy bruising musculoskeletal: N/A skin: N/A genitourinary: N/A Gastrointestinal: N/A allergy/immunology: N/A neurological: difficulty swallowing sleep: N/A psychiatric: N/A   ALLERGIES: No Known Allergies  HOME MEDICATIONS: Outpatient Prescriptions Prior to Visit  Medication Sig Dispense Refill  . aspirin 81 MG tablet Take 81 mg by mouth daily.      Marland Kitchen atorvastatin (LIPITOR) 80 MG tablet Take 1 tablet (80 mg total) by mouth daily.  30 tablet  5  . clopidogrel (PLAVIX) 75 MG tablet Take 75 mg by mouth daily.      . Colesevelam HCl 3.75 G PACK Take 1 packet (3.8 g total) by mouth daily.  30 each  3  . insulin glargine (LANTUS) 100 UNIT/ML injection Inject 70 Units into the skin daily with breakfast.       .  insulin glulisine (APIDRA) 100 UNIT/ML injection Inject 20 Units into the skin 3 (three) times daily before meals. 20 units at breakfast, 55 units at lunch, 40 units at supper      . Insulin Lispro Prot & Lispro (HUMALOG MIX 50/50 KWIKPEN) (50-50) 100 UNIT/ML SUPN Inject 45 Units into the skin 3 (three) times daily before meals.  15 mL  3  . lisinopril (PRINIVIL,ZESTRIL) 20 MG tablet 20 mg. Take 1 tablet (20 mg total) by  mouth daily.      . valsartan (DIOVAN) 80 MG tablet Take 1 tablet (80 mg total) by mouth daily.  30 tablet  2   No facility-administered medications prior to visit.    PAST MEDICAL HISTORY: Past Medical History  Diagnosis Date  . Hypertension   . Arthritis   . Dyslipidemia   . Coronary artery disease   . Diabetes mellitus     insulin dependent    PAST SURGICAL HISTORY: Past Surgical History  Procedure Laterality Date  . Open heart surgery  1998  . Coronary artery bypass graft    . Cardiac catheterization      FAMILY HISTORY: No family history on file.  SOCIAL HISTORY: History   Social History  . Marital Status: Married    Spouse Name: sumitra    Number of Children: 2  . Years of Education: 12   Occupational History  . retired    Social History Main Topics  . Smoking status: Never Smoker   . Smokeless tobacco: Never Used  . Alcohol Use: No  . Drug Use: No  . Sexual Activity: Not Currently   Other Topics Concern  . Not on file   Social History Narrative  . No narrative on file     PHYSICAL EXAM  Filed Vitals:   07/01/13 1400  BP: 144/73  Pulse: 78  Temp: 97.9 F (36.6 C)  TempSrc: Oral  Height: 5' 5.5" (1.664 m)  Weight: 171 lb (77.565 kg)   Body mass index is 28.01 kg/(m^2).  Generalized: In no acute distress, pleasant Asian male.  Neck: Supple, no carotid bruits   Cardiac: Regular rate rhythm, no murmur   Pulmonary: Clear to auscultation bilaterally   Musculoskeletal: No deformity   Neurological examination  Mental Status: Awake and fully alert. Oriented to place and time. Recent and remote memory intact. Attention span, concentration and fund of knowledge diminished. Mood and affect appropriate.No emotional lability seen during this visit. Mini-Mental status exam scored 21/30 with deficits in orientation, attention and recall. Animal naming test 6 only. Clock drawing 4/4.  Cranial Nerves: Fundoscopic exam reveals sharp disc margins.  Pupils equal, briskly reactive to light. Extraocular movements full without nystagmus. Visual fields full to confrontation. Hearing intact. Facial sensation intact. Face, tongue, palate moves normally and symmetrically.  Motor: Normal bulk and tone. Normal strength in all tested extremity muscles. Diminished fine finger movements on the right. Orbits left over right upper extremity. Mild weakness of right hip flexor and ankle dorsiflexors.  Sensory.: intact to touch and pinprick But diminished vibratory sensation on ankles and toes bilaterally...  Coordination: Rapid alternating movements normal in all extremities. Finger-to-nose and heel-to-shin performed accurately bilaterally.  Gait and Station: Arises from chair with some difficulty; falls backward in seat once. Stance is normal. Gait demonstrates mild stiffness and dragging of the right leg and slight unsteadiness when he turns and first gets up. Unable to walk tandem without difficulty  Reflexes: 1+ and symmetric. Toes downgoing.  NIHSS 1  Modified  Rankin 2    DIAGNOSTIC DATA (LABS, IMAGING, TESTING) - I reviewed patient records, labs, notes, testing and imaging myself where available.  Lab Results  Component Value Date   WBC 5.6 02/01/2012   HGB 13.3 02/01/2012   HCT 40.7 02/01/2012   MCV 84.6 02/01/2012   PLT 243 02/01/2012      Component Value Date/Time   NA 137 06/19/2013 0922   K 4.2 06/19/2013 0922   CL 102 06/19/2013 0922   CO2 27 06/19/2013 0922   GLUCOSE 149* 06/19/2013 0922   BUN 20 06/19/2013 0922   CREATININE 1.1 06/19/2013 0922   CALCIUM 9.8 06/19/2013 0922   PROT 8.3 06/19/2013 0922   ALBUMIN 4.4 06/19/2013 0922   AST 22 06/19/2013 0922   ALT 21 06/19/2013 0922   ALKPHOS 67 06/19/2013 0922   BILITOT 0.5 06/19/2013 0922   GFRNONAA 64* 02/01/2012 1405   GFRAA 74* 02/01/2012 1405   Lab Results  Component Value Date   CHOL 217* 06/19/2013   HDL 41.60 06/19/2013   LDLCALC 86 04/17/2013   LDLDIRECT 164.6 06/19/2013   TRIG 120.0 06/19/2013     CHOLHDL 5 06/19/2013   Lab Results  Component Value Date   HGBA1C 9.5* 06/19/2013   TCD 05/15/13-Consistent with TCD signs of mild stenosis of the right C1. An abnormally elevated CBFV's  seen in the left M1 in the left A1 without TCD signs of stenosis. The CBFV asymmetry seen between the right and left M1 segments, possibly due to stenotic narrowing of the right M1. An abnormally elevated CBFV be seen in the left M2, possibly due to minor stenotic narrowing of this vessel. Mildly decreased CBFV's observed in the bilateral carotid siphons, of unclear clinical and hemodynamic significance. An abnormally elevated PIs through the majority of the insonated vessels suggestive of diffuse atherosclerosis. Carotid Dopplers 05/15/13- negative for hemodynamically significant extracranial stenosis.  ASSESSMENT AND PLAN 72 year old male with suspect recent right brain infarct one month ago in [place] of unclear etiology as I am missing all his records from that hospitalization. Prior history of left pontine infarct in May 2013 as well as silent bilateral subcortical lacunar infarcts. Vascular risk factors of diabetes, hypertension, hyperlipidemia and cerebrovascular disease. 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 Mild pseudobulbar affect, patient does now want to try medication for it  PLAN:  Continue Plavix alone for secondary stroke prevention. Strict control of diabetes with hemoglobin A1c goal below 6.5%, lipids with LDL cholesterol goal below 70 mg percent and hypertension with blood pressure goal below 120/80.   Start Nuedexta for [employer]. 1 Daily for 1 week, then increase to 1 every 12 hours. Return for followup in 2 months.  Meds ordered this encounter  Medications  . Dextromethorphan-Quinidine 20-10 MG CAPS    Sig: Take 1 capsule by mouth every 12 (twelve) hours.    Dispense:  60 capsule    Refill:  1    Order Specific Question:  Supervising Provider    Answer:   Dallie Piles    Zuma Hust NP-C 07/01/2013, 2:03 PM [facility] [address] [place], [place] [address] [phone]

## 2013-07-01 NOTE — Patient Instructions (Signed)
Start medication 1 capsule every night.  After 1 week, increase to 1 capsule every 12 hours.  Continue aspirin 81 mg orally every day and clopidogrel 75 mg orally every day  for secondary stroke prevention and maintain strict control of hypertension with blood pressure goal below 130/90, diabetes with hemoglobin A1c goal below 6.5 and lipids with LDL cholesterol goal below 100 mg/dL.   Follow up visit in 2 months.

## 2013-07-02 NOTE — Telephone Encounter (Signed)
No, I will give it to his daughter when she drops off the paperwork

## 2013-07-02 NOTE — Telephone Encounter (Signed)
Did he get the coupon for free box of pens?

## 2013-07-02 NOTE — Telephone Encounter (Signed)
It was the Humalog 50/50, I gave his daughter the website to apply for patient assistance through [employer].

## 2013-07-08 ENCOUNTER — Telehealth: Payer: Self-pay | Admitting: *Deleted

## 2013-07-08 NOTE — Telephone Encounter (Signed)
LMVM for pt re: doppler study results.

## 2013-07-17 ENCOUNTER — Other Ambulatory Visit: Payer: Self-pay

## 2013-07-21 ENCOUNTER — Encounter: Payer: Self-pay | Admitting: *Deleted

## 2013-07-21 ENCOUNTER — Ambulatory Visit: Payer: Self-pay | Admitting: Endocrinology

## 2013-07-21 DIAGNOSIS — Z0289 Encounter for other administrative examinations: Secondary | ICD-10-CM

## 2013-08-07 ENCOUNTER — Other Ambulatory Visit: Payer: Self-pay

## 2013-09-03 ENCOUNTER — Ambulatory Visit: Payer: Medicare HMO | Admitting: Nurse Practitioner

## 2013-09-09 ENCOUNTER — Other Ambulatory Visit: Payer: Self-pay | Admitting: *Deleted

## 2013-09-09 MED ORDER — INSULIN LISPRO PROT & LISPRO (50-50 MIX) 100 UNIT/ML KWIKPEN: 45.0000 [IU] | PEN_INJECTOR | Freq: Three times a day (TID) | SUBCUTANEOUS | Status: DC

## 2013-09-15 ENCOUNTER — Inpatient Hospital Stay (HOSPITAL_COMMUNITY): Payer: Medicare HMO

## 2013-09-15 ENCOUNTER — Encounter (HOSPITAL_COMMUNITY)
Admission: EM | Disposition: A | Discharge status: Home / Self Care | Payer: Self-pay | Source: Home / Self Care | Attending: Cardiovascular Disease

## 2013-09-15 ENCOUNTER — Ambulatory Visit (HOSPITAL_COMMUNITY): Admit: 2013-09-15 | Payer: Self-pay | Admitting: Cardiology

## 2013-09-15 ENCOUNTER — Inpatient Hospital Stay (HOSPITAL_COMMUNITY)
Admission: EM | Admit: 2013-09-15 | Discharge: 2013-09-18 | DRG: 246 | Disposition: A | Discharge status: Home / Self Care | Payer: Medicare HMO | Attending: Cardiovascular Disease | Admitting: Cardiovascular Disease

## 2013-09-15 ENCOUNTER — Other Ambulatory Visit: Payer: Self-pay

## 2013-09-15 ENCOUNTER — Encounter (HOSPITAL_COMMUNITY): Payer: Self-pay | Admitting: *Deleted

## 2013-09-15 DIAGNOSIS — Z7982 Long term (current) use of aspirin: Secondary | ICD-10-CM

## 2013-09-15 DIAGNOSIS — E785 Hyperlipidemia, unspecified: Secondary | ICD-10-CM | POA: Diagnosis present

## 2013-09-15 DIAGNOSIS — I442 Atrioventricular block, complete: Secondary | ICD-10-CM | POA: Diagnosis present

## 2013-09-15 DIAGNOSIS — I25810 Atherosclerosis of coronary artery bypass graft(s) without angina pectoris: Secondary | ICD-10-CM | POA: Diagnosis present

## 2013-09-15 DIAGNOSIS — I498 Other specified cardiac arrhythmias: Secondary | ICD-10-CM | POA: Diagnosis not present

## 2013-09-15 DIAGNOSIS — T50995A Adverse effect of other drugs, medicaments and biological substances, initial encounter: Secondary | ICD-10-CM | POA: Diagnosis not present

## 2013-09-15 DIAGNOSIS — N182 Chronic kidney disease, stage 2 (mild): Secondary | ICD-10-CM | POA: Diagnosis present

## 2013-09-15 DIAGNOSIS — D509 Iron deficiency anemia, unspecified: Secondary | ICD-10-CM | POA: Diagnosis present

## 2013-09-15 DIAGNOSIS — I2582 Chronic total occlusion of coronary artery: Secondary | ICD-10-CM | POA: Diagnosis present

## 2013-09-15 DIAGNOSIS — Z794 Long term (current) use of insulin: Secondary | ICD-10-CM

## 2013-09-15 DIAGNOSIS — Z9861 Coronary angioplasty status: Secondary | ICD-10-CM

## 2013-09-15 DIAGNOSIS — N289 Disorder of kidney and ureter, unspecified: Secondary | ICD-10-CM | POA: Diagnosis not present

## 2013-09-15 DIAGNOSIS — E1169 Type 2 diabetes mellitus with other specified complication: Secondary | ICD-10-CM | POA: Diagnosis not present

## 2013-09-15 DIAGNOSIS — I2510 Atherosclerotic heart disease of native coronary artery without angina pectoris: Secondary | ICD-10-CM | POA: Diagnosis present

## 2013-09-15 DIAGNOSIS — I441 Atrioventricular block, second degree: Secondary | ICD-10-CM | POA: Diagnosis present

## 2013-09-15 DIAGNOSIS — Y9210 Unspecified residential institution as the place of occurrence of the external cause: Secondary | ICD-10-CM | POA: Diagnosis not present

## 2013-09-15 DIAGNOSIS — I69998 Other sequelae following unspecified cerebrovascular disease: Secondary | ICD-10-CM

## 2013-09-15 DIAGNOSIS — I129 Hypertensive chronic kidney disease with stage 1 through stage 4 chronic kidney disease, or unspecified chronic kidney disease: Secondary | ICD-10-CM | POA: Diagnosis present

## 2013-09-15 DIAGNOSIS — M1990 Unspecified osteoarthritis, unspecified site: Secondary | ICD-10-CM | POA: Diagnosis present

## 2013-09-15 DIAGNOSIS — I2119 ST elevation (STEMI) myocardial infarction involving other coronary artery of inferior wall: Principal | ICD-10-CM | POA: Diagnosis present

## 2013-09-15 DIAGNOSIS — I959 Hypotension, unspecified: Secondary | ICD-10-CM | POA: Diagnosis not present

## 2013-09-15 DIAGNOSIS — I4891 Unspecified atrial fibrillation: Secondary | ICD-10-CM | POA: Diagnosis not present

## 2013-09-15 DIAGNOSIS — Z79899 Other long term (current) drug therapy: Secondary | ICD-10-CM

## 2013-09-15 DIAGNOSIS — I509 Heart failure, unspecified: Secondary | ICD-10-CM | POA: Diagnosis present

## 2013-09-15 DIAGNOSIS — I5021 Acute systolic (congestive) heart failure: Secondary | ICD-10-CM | POA: Diagnosis present

## 2013-09-15 DIAGNOSIS — R29898 Other symptoms and signs involving the musculoskeletal system: Secondary | ICD-10-CM | POA: Diagnosis present

## 2013-09-15 HISTORY — PX: PERCUTANEOUS STENT INTERVENTION: SHX5500

## 2013-09-15 HISTORY — PX: LEFT HEART CATHETERIZATION WITH CORONARY ANGIOGRAM: SHX5451

## 2013-09-15 LAB — COMPREHENSIVE METABOLIC PANEL
AST: 55 U/L — ABNORMAL HIGH (ref 0–37)
Albumin: 2.9 g/dL — ABNORMAL LOW (ref 3.5–5.2)
Alkaline Phosphatase: 63 U/L (ref 39–117)
Chloride: 94 mEq/L — ABNORMAL LOW (ref 96–112)
Creatinine, Ser: 1.3 mg/dL (ref 0.50–1.35)
Potassium: 4.9 mEq/L (ref 3.5–5.1)
Total Bilirubin: 0.7 mg/dL (ref 0.3–1.2)
Total Protein: 6.7 g/dL (ref 6.0–8.3)

## 2013-09-15 LAB — CBC WITH DIFFERENTIAL/PLATELET
Basophils Absolute: 0 10*3/uL (ref 0.0–0.1)
Eosinophils Absolute: 0 10*3/uL (ref 0.0–0.7)
Eosinophils Relative: 0 % (ref 0–5)
Lymphocytes Relative: 13 % (ref 12–46)
MCV: 83.3 fL (ref 78.0–100.0)
Neutro Abs: 10.7 10*3/uL — ABNORMAL HIGH (ref 1.7–7.7)
Platelets: 288 10*3/uL (ref 150–400)
RDW: 14.7 % (ref 11.5–15.5)
WBC: 14.1 10*3/uL — ABNORMAL HIGH (ref 4.0–10.5)

## 2013-09-15 LAB — POCT I-STAT, CHEM 8
BUN: 36 mg/dL — ABNORMAL HIGH (ref 6–23)
Chloride: 98 mEq/L (ref 96–112)
Creatinine, Ser: 1.4 mg/dL — ABNORMAL HIGH (ref 0.50–1.35)
Potassium: 5.9 mEq/L — ABNORMAL HIGH (ref 3.5–5.1)
Sodium: 126 mEq/L — ABNORMAL LOW (ref 135–145)
TCO2: 18 mmol/L (ref 0–100)

## 2013-09-15 LAB — PROTIME-INR: Prothrombin Time: 27.8 seconds — ABNORMAL HIGH (ref 11.6–15.2)

## 2013-09-15 LAB — GLUCOSE, CAPILLARY: Glucose-Capillary: 487 mg/dL — ABNORMAL HIGH (ref 70–99)

## 2013-09-15 LAB — MRSA PCR SCREENING: MRSA by PCR: NEGATIVE

## 2013-09-15 LAB — POCT ACTIVATED CLOTTING TIME: Activated Clotting Time: 437 seconds

## 2013-09-15 SURGERY — LEFT HEART CATHETERIZATION WITH CORONARY ANGIOGRAM
Anesthesia: LOCAL

## 2013-09-15 MED ORDER — ALPRAZOLAM 0.25 MG PO TABS
0.2500 mg | ORAL_TABLET | Freq: Two times a day (BID) | ORAL | Status: DC
  Administered 2013-09-15 – 2013-09-16 (×3): 0.25 mg via ORAL
  Filled 2013-09-15 (×3): qty 1

## 2013-09-15 MED ORDER — NITROGLYCERIN 0.4 MG SL SUBL: 0.4000 mg | SUBLINGUAL_TABLET | SUBLINGUAL | Status: DC | PRN

## 2013-09-15 MED ORDER — ASPIRIN 81 MG PO CHEW
324.0000 mg | CHEWABLE_TABLET | ORAL | Status: AC
  Administered 2013-09-15: 324 mg via ORAL

## 2013-09-15 MED ORDER — ASPIRIN EC 81 MG PO TBEC
81.0000 mg | DELAYED_RELEASE_TABLET | Freq: Every day | ORAL | Status: DC
  Administered 2013-09-16 – 2013-09-18 (×3): 81 mg via ORAL
  Filled 2013-09-15 (×3): qty 1

## 2013-09-15 MED ORDER — ATROPINE SULFATE 0.1 MG/ML IJ SOLN
INTRAMUSCULAR | Status: AC
  Filled 2013-09-15: qty 10

## 2013-09-15 MED ORDER — ATORVASTATIN CALCIUM 80 MG PO TABS
80.0000 mg | ORAL_TABLET | Freq: Every day | ORAL | Status: DC
  Administered 2013-09-15 – 2013-09-18 (×4): 80 mg via ORAL
  Filled 2013-09-15 (×4): qty 1

## 2013-09-15 MED ORDER — INSULIN GLARGINE 100 UNIT/ML ~~LOC~~ SOLN
30.0000 [IU] | Freq: Two times a day (BID) | SUBCUTANEOUS | Status: DC
  Administered 2013-09-15 – 2013-09-18 (×5): 30 [IU] via SUBCUTANEOUS
  Filled 2013-09-15 (×7): qty 0.3

## 2013-09-15 MED ORDER — SODIUM CHLORIDE 0.9 % IV SOLN
INTRAVENOUS | Status: AC
  Administered 2013-09-15: 20:00:00 via INTRAVENOUS

## 2013-09-15 MED ORDER — AMIODARONE HCL IN DEXTROSE 360-4.14 MG/200ML-% IV SOLN
30.0000 mg/h | INTRAVENOUS | Status: DC
Start: 2013-09-16 — End: 2013-09-16
  Filled 2013-09-15 (×4): qty 200

## 2013-09-15 MED ORDER — TICAGRELOR 90 MG PO TABS
ORAL_TABLET | ORAL | Status: AC
  Filled 2013-09-15: qty 1

## 2013-09-15 MED ORDER — BIVALIRUDIN 250 MG IV SOLR
INTRAVENOUS | Status: AC
  Filled 2013-09-15: qty 250

## 2013-09-15 MED ORDER — ASPIRIN 300 MG RE SUPP
300.0000 mg | RECTAL | Status: AC
  Filled 2013-09-15: qty 1

## 2013-09-15 MED ORDER — NITROGLYCERIN 0.2 MG/ML ON CALL CATH LAB
INTRAVENOUS | Status: AC
  Filled 2013-09-15: qty 1

## 2013-09-15 MED ORDER — AMIODARONE HCL IN DEXTROSE 360-4.14 MG/200ML-% IV SOLN
60.0000 mg/h | INTRAVENOUS | Status: AC
  Administered 2013-09-15: 60 mg/h via INTRAVENOUS
  Filled 2013-09-15: qty 200

## 2013-09-15 MED ORDER — HEPARIN (PORCINE) IN NACL 2-0.9 UNIT/ML-% IJ SOLN
INTRAMUSCULAR | Status: AC
  Filled 2013-09-15: qty 1000

## 2013-09-15 MED ORDER — AMIODARONE IV BOLUS ONLY 150 MG/100ML
150.0000 mg | Freq: Once | INTRAVENOUS | Status: AC
  Administered 2013-09-15: 150 mg via INTRAVENOUS

## 2013-09-15 MED ORDER — ASPIRIN 81 MG PO TABS
81.0000 mg | ORAL_TABLET | Freq: Every day | ORAL | Status: DC
  Administered 2013-09-15: 81 mg via ORAL
  Filled 2013-09-15 (×2): qty 1

## 2013-09-15 MED ORDER — ONDANSETRON HCL 4 MG/2ML IJ SOLN: 4.0000 mg | Freq: Four times a day (QID) | INTRAMUSCULAR | Status: DC | PRN

## 2013-09-15 MED ORDER — ASPIRIN 81 MG PO CHEW: 81.0000 mg | CHEWABLE_TABLET | Freq: Every day | ORAL | Status: DC

## 2013-09-15 MED ORDER — HEPARIN SODIUM (PORCINE) 1000 UNIT/ML IJ SOLN
INTRAMUSCULAR | Status: AC
  Filled 2013-09-15: qty 1

## 2013-09-15 MED ORDER — ATORVASTATIN CALCIUM 80 MG PO TABS
80.0000 mg | ORAL_TABLET | Freq: Every day | ORAL | Status: DC
  Filled 2013-09-15: qty 1

## 2013-09-15 MED ORDER — ACETAMINOPHEN 325 MG PO TABS: 650.0000 mg | ORAL_TABLET | ORAL | Status: DC | PRN

## 2013-09-15 MED ORDER — TICAGRELOR 90 MG PO TABS
90.0000 mg | ORAL_TABLET | Freq: Two times a day (BID) | ORAL | Status: DC
  Filled 2013-09-15 (×3): qty 1

## 2013-09-15 MED ORDER — NITROGLYCERIN IN D5W 200-5 MCG/ML-% IV SOLN
5.0000 ug/min | INTRAVENOUS | Status: DC
  Administered 2013-09-15: 10 ug/min via INTRAVENOUS

## 2013-09-15 MED ORDER — FUROSEMIDE 10 MG/ML IJ SOLN
40.0000 mg | Freq: Once | INTRAMUSCULAR | Status: AC
  Administered 2013-09-15: 40 mg via INTRAVENOUS

## 2013-09-15 MED ORDER — LIDOCAINE HCL (PF) 1 % IJ SOLN
INTRAMUSCULAR | Status: AC
  Filled 2013-09-15: qty 30

## 2013-09-15 MED ORDER — SODIUM CHLORIDE 0.9 % IV SOLN
0.2500 mg/kg/h | INTRAVENOUS | Status: AC
  Administered 2013-09-15: 0.25 mg/kg/h via INTRAVENOUS
  Filled 2013-09-15: qty 250

## 2013-09-15 MED ORDER — METOPROLOL TARTRATE 12.5 MG HALF TABLET
12.5000 mg | ORAL_TABLET | Freq: Two times a day (BID) | ORAL | Status: DC
  Administered 2013-09-15 – 2013-09-18 (×6): 12.5 mg via ORAL
  Filled 2013-09-15 (×7): qty 1

## 2013-09-15 MED ORDER — NITROGLYCERIN IN D5W 200-5 MCG/ML-% IV SOLN
INTRAVENOUS | Status: AC
  Filled 2013-09-15: qty 250

## 2013-09-15 MED ORDER — ALBUTEROL SULFATE (5 MG/ML) 0.5% IN NEBU
INHALATION_SOLUTION | RESPIRATORY_TRACT | Status: AC
  Filled 2013-09-15: qty 1

## 2013-09-15 MED ORDER — INSULIN GLULISINE 100 UNIT/ML IJ SOLN: 20.0000 [IU] | Freq: Three times a day (TID) | INTRAMUSCULAR | Status: DC

## 2013-09-15 MED ORDER — ALBUTEROL SULFATE (5 MG/ML) 0.5% IN NEBU
2.5000 mg | INHALATION_SOLUTION | RESPIRATORY_TRACT | Status: DC
  Administered 2013-09-15: 2.5 mg via RESPIRATORY_TRACT
  Filled 2013-09-15: qty 0.5

## 2013-09-15 MED ORDER — INSULIN ASPART 100 UNIT/ML ~~LOC~~ SOLN
0.0000 [IU] | Freq: Three times a day (TID) | SUBCUTANEOUS | Status: DC
  Administered 2013-09-16: 1 [IU] via SUBCUTANEOUS
  Administered 2013-09-16: 5 [IU] via SUBCUTANEOUS
  Administered 2013-09-16: 9 [IU] via SUBCUTANEOUS

## 2013-09-15 MED ORDER — TICAGRELOR 90 MG PO TABS
90.0000 mg | ORAL_TABLET | Freq: Two times a day (BID) | ORAL | Status: DC
  Administered 2013-09-15 – 2013-09-18 (×6): 90 mg via ORAL
  Filled 2013-09-15 (×7): qty 1

## 2013-09-15 NOTE — H&P (Signed)
Calvin Perez is an 72 y.o. male.   Chief Complaint: Chest pain HPI: 72 years old male has retrosternal chest pain x 2 days without radiation or shortness of breath. He had LAD angioplasty long time ago. In 2006 he had 4 vessel by pass graft surgery. He has long standing history of diabetes, poorly controlled at times due to non-compliance.   Past Medical History  Diagnosis Date  . Hypertension   . Arthritis   . Dyslipidemia   . Coronary artery disease   . Diabetes mellitus     insulin dependent      Past Surgical History  Procedure Laterality Date  . Open heart surgery  1998  . Coronary artery bypass graft    . Cardiac catheterization      No family history on file. Social History:  reports that he has never smoked. He has never used smokeless tobacco. He reports that he does not drink alcohol or use illicit drugs.  Allergies: No Known Allergies  Medications Prior to Admission  Medication Sig Dispense Refill  . aspirin 81 MG tablet Take 81 mg by mouth daily.      Marland Kitchen atorvastatin (LIPITOR) 80 MG tablet Take 1 tablet (80 mg total) by mouth daily.  30 tablet  5  . clopidogrel (PLAVIX) 75 MG tablet Take 75 mg by mouth daily.      . Colesevelam HCl 3.75 G PACK Take 1 packet (3.8 g total) by mouth daily.  30 each  3  . Dextromethorphan-Quinidine 20-10 MG CAPS Take 1 capsule by mouth every 12 (twelve) hours.  60 capsule  1  . insulin glargine (LANTUS) 100 UNIT/ML injection Inject 70 Units into the skin daily with breakfast.       . insulin glulisine (APIDRA) 100 UNIT/ML injection Inject 20 Units into the skin 3 (three) times daily before meals. 20 units at breakfast, 55 units at lunch, 40 units at supper      . Insulin Lispro Prot & Lispro (HUMALOG MIX 50/50 KWIKPEN) (50-50) 100 UNIT/ML SUPN Inject 45 Units into the skin 3 (three) times daily before meals.  15 mL  3  . lisinopril (PRINIVIL,ZESTRIL) 20 MG tablet 20 mg. Take 1 tablet (20 mg total) by mouth daily.      . sertraline  (ZOLOFT) 50 MG tablet 50 mg. Take 1 tablet (50 mg total) by mouth daily.      . valsartan (DIOVAN) 80 MG tablet Take 1 tablet (80 mg total) by mouth daily.  30 tablet  2    No results found for this or any previous visit (from the past 48 hour(Perez)). No results found.  ROS Positive weight loss of 10 pounds in one year. Positive  vision change with patient wearing driving glasses. Positive cataract  surgery. Negative hearing loss, negative tinnitus, negative rhinorrhea,  negative dentures, negative cough, hemoptysis, asthma, COPD, pneumonia,  dyspnea, nausea, vomiting, diarrhea, GI bleed, ulcer, hernia, hepatitis or  blood transfusion. Negative history of kidney stone, dysuria, stroke,  seizures, or psychiatric admissions.  There were no vitals taken for this visit.  PHYSICAL EXAMINATION: VITAL SIGNS: Pulse 59, respirations 14, blood  pressure 142/109, height 5 feet 4 inches, weight about 160 pounds.  GENERAL: Patient is alert and oriented x3.  HEAD: Normocephalic, atraumatic with frontal baldness.  EYES: Calvin Perez with pupils reacting to light. Extraocular movements intact.  EARS/NOSE/THROAT: Mucous membranes pink and moist.  NECK: No JVD. No carotid bruit.  LUNGS: Clear bilaterally. Mid-line scar of CABG. HEART: Normal  S1-S2. II/VI systolic murmur. ABDOMEN: Soft and nontender.  EXTREMITY: No edema, cyanosis, or clubbing.  CNS: Cranial nerves grossly intact and mild right sided weakness.  Assessment/Plan Acute inferior wall MI Hypertension DM, II CAD CABG Perez/P Stroke with right sided weakness  Emergent cardiac cath and angioplasty by Dr. Tyler Aas.  Calvin Perez 09/15/2013, 6:14 PM

## 2013-09-15 NOTE — Progress Notes (Signed)
Rt called to give tx for wheezing.. RT initiated tx, but pt began pulling off mask saying he didn't want it. Pt is very anxious and scared.  Rt removed tx and spoke with rn about starting tx again later when he relaxes.

## 2013-09-15 NOTE — Consult Note (Signed)
Reason for Consult: Acute inferior wall MI Referring Physician: Dr. Elyse Hsu Calvin Perez is an 72 y.o. male.  HPI: Patient is 72 year old male with past medical history significant for coronary artery disease status post CABG in 2006 he had LIMA to LAD saphenous graft to diagonal 1, saphenous vein graft to obtuse marginal 2, saphenous vein graft to PDA, hypertension, insulin requiring diabetes mellitus, hypercholesteremia, degenerative joint disease, history of CVA in the past, history of PCI to LAD in the past, came to the [facility] asCODE STEMI was called. Patient complained of recurrent retrosternal chest pain for last 2-3 days off and on today pain got worse so went to PMDs office patient had EKG done in PMDs office which showed normal sinus rhythm with first degree AV block with ST elevation in leads 23 aVF and ST depression in lead 1 and aVL suggestive of acute inferior wall MI. Patient denies any nausea vomiting diaphoresis. Denies any shortness of breath denies palpitations lightheadedness or syncope  Past Medical History  Diagnosis Date  . Hypertension   . Arthritis   . Dyslipidemia   . Coronary artery disease   . Diabetes mellitus     insulin dependent    Past Surgical History  Procedure Laterality Date  . Open heart surgery  1998  . Coronary artery bypass graft    . Cardiac catheterization      No family history on file.  Social History:  reports that he has never smoked. He has never used smokeless tobacco. He reports that he does not drink alcohol or use illicit drugs.  Allergies: No Known Allergies  Medications: I have reviewed the patient's current medications.  Results for orders placed during the hospital encounter of 09/15/13 (from the past 48 hour(s))  POCT ACTIVATED CLOTTING TIME     Status: None   Collection Time    09/15/13  6:02 PM      Result Value Range   Activated Clotting Time 437    POCT I-STAT, CHEM 8     Status: Abnormal   Collection  Time    09/15/13  6:05 PM      Result Value Range   Sodium 126 (*) 135 - 145 mEq/L   Potassium 5.9 (*) 3.5 - 5.1 mEq/L   Chloride 98  96 - 112 mEq/L   BUN 36 (*) 6 - 23 mg/dL   Creatinine, Ser 1.47 (*) 0.50 - 1.35 mg/dL   Glucose, Bld 829 (*) 70 - 99 mg/dL   Calcium, Ion 5.62  1.30 - 1.30 mmol/L   TCO2 18  0 - 100 mmol/L   Hemoglobin 13.3  13.0 - 17.0 g/dL   HCT 86.5  78.4 - 69.6 %  GLUCOSE, CAPILLARY     Status: Abnormal   Collection Time    09/15/13  7:43 PM      Result Value Range   Glucose-Capillary 487 (*) 70 - 99 mg/dL    No results found.  Review of Systems  Unable to perform ROS: critical illness   There were no vitals taken for this visit. Physical Exam  Constitutional: He is oriented to person, place, and time.  Eyes: Conjunctivae are normal.  Neck: No tracheal deviation present. No thyromegaly present.  Cardiovascular: Normal rate and regular rhythm.   Murmur (soft systolic murmur and S4 gallop noted) heard. Respiratory: Effort normal and breath sounds normal. No respiratory distress.  GI: Bowel sounds are normal. He exhibits no distension. There is no tenderness.  Musculoskeletal:  He exhibits no edema and no tenderness.  Neurological: He is alert and oriented to person, place, and time.    Assessment/Plan: Acute inferior wall myocardial infarction Coronary artery disease status post CABG in the past as above Hypertension Insulin requiring diabetes matters Hypercholesteremia History of CVA Degenerative joint disease Plan Discussed briefly with patient regarding emergency left cath possible PTCA stenting of [place] benefits i.e. death MI stroke need for emergency CABG local vascular complications etc. and consented for PCI.   Donice Alperin N 09/15/2013, 8:08 PM

## 2013-09-16 LAB — POCT ACTIVATED CLOTTING TIME: Activated Clotting Time: 182 seconds

## 2013-09-16 LAB — APTT: aPTT: 107 seconds — ABNORMAL HIGH (ref 24–37)

## 2013-09-16 LAB — BASIC METABOLIC PANEL
Chloride: 97 mEq/L (ref 96–112)
Creatinine, Ser: 1.53 mg/dL — ABNORMAL HIGH (ref 0.50–1.35)
GFR calc Af Amer: 50 mL/min — ABNORMAL LOW (ref >90)
GFR calc non Af Amer: 43 mL/min — ABNORMAL LOW (ref >90)
Potassium: 4.5 mEq/L (ref 3.5–5.1)

## 2013-09-16 LAB — LIPID PANEL
Cholesterol: 198 mg/dL (ref 0–200)
HDL: 38 mg/dL — ABNORMAL LOW (ref >39)
LDL Cholesterol: 143 mg/dL — ABNORMAL HIGH (ref 0–99)
Total CHOL/HDL Ratio: 5.2 RATIO
Triglycerides: 84 mg/dL (ref <150)

## 2013-09-16 LAB — CBC
HCT: 32.6 % — ABNORMAL LOW (ref 39.0–52.0)
MCH: 28 pg (ref 26.0–34.0)
MCV: 84.5 fL (ref 78.0–100.0)
Platelets: 267 10*3/uL (ref 150–400)
RDW: 15 % (ref 11.5–15.5)
WBC: 14.4 10*3/uL — ABNORMAL HIGH (ref 4.0–10.5)

## 2013-09-16 LAB — HEMOGLOBIN A1C
Hgb A1c MFr Bld: 9.1 % — ABNORMAL HIGH (ref <5.7)
Mean Plasma Glucose: 214 mg/dL — ABNORMAL HIGH (ref <117)

## 2013-09-16 LAB — GLUCOSE, CAPILLARY
Glucose-Capillary: 363 mg/dL — ABNORMAL HIGH (ref 70–99)
Glucose-Capillary: 252 mg/dL — ABNORMAL HIGH (ref 70–99)

## 2013-09-16 LAB — TSH: TSH: 1.268 u[IU]/mL (ref 0.350–4.500)

## 2013-09-16 MED ORDER — INSULIN LISPRO PROT & LISPRO (50-50 MIX) 100 UNIT/ML ~~LOC~~ SUSP
15.0000 [IU] | Freq: Three times a day (TID) | SUBCUTANEOUS | Status: DC
  Administered 2013-09-16 – 2013-09-17 (×3): 15 [IU] via SUBCUTANEOUS
  Filled 2013-09-16: qty 10

## 2013-09-16 MED ORDER — DEXTROSE 50 % IV SOLN
50.0000 mL | Freq: Once | INTRAVENOUS | Status: AC | PRN
  Administered 2013-09-16: 50 mL via INTRAVENOUS

## 2013-09-16 MED ORDER — ISOSORBIDE MONONITRATE ER 30 MG PO TB24
30.0000 mg | ORAL_TABLET | Freq: Every day | ORAL | Status: DC
  Administered 2013-09-16 – 2013-09-18 (×3): 30 mg via ORAL
  Filled 2013-09-16 (×3): qty 1

## 2013-09-16 MED ORDER — INSULIN LISPRO PROT & LISPRO (50-50 MIX) 100 UNIT/ML KWIKPEN: 15.0000 [IU] | PEN_INJECTOR | Freq: Three times a day (TID) | SUBCUTANEOUS | Status: DC

## 2013-09-16 MED ORDER — INSULIN ASPART 100 UNIT/ML ~~LOC~~ SOLN
20.0000 [IU] | Freq: Three times a day (TID) | SUBCUTANEOUS | Status: DC
  Administered 2013-09-16 – 2013-09-17 (×3): 20 [IU] via SUBCUTANEOUS

## 2013-09-16 MED ORDER — SERTRALINE HCL 50 MG PO TABS
50.0000 mg | ORAL_TABLET | Freq: Every day | ORAL | Status: DC
  Administered 2013-09-16 – 2013-09-17 (×2): 50 mg via ORAL
  Filled 2013-09-16 (×3): qty 1

## 2013-09-16 MED ORDER — DEXTROSE 50 % IV SOLN
INTRAVENOUS | Status: AC
  Administered 2013-09-16: 50 mL via INTRAVENOUS
  Filled 2013-09-16: qty 50

## 2013-09-16 MED ORDER — AMIODARONE HCL 200 MG PO TABS
200.0000 mg | ORAL_TABLET | Freq: Two times a day (BID) | ORAL | Status: DC
  Administered 2013-09-16 – 2013-09-18 (×5): 200 mg via ORAL
  Filled 2013-09-16 (×6): qty 1

## 2013-09-16 MED FILL — Sodium Chloride IV Soln 0.9%: INTRAVENOUS | Qty: 50 | Status: AC

## 2013-09-16 NOTE — Progress Notes (Signed)
Subjective:  Feeling better. Venous sheath left in for IV access. 2nd and 3rd degree heart block on monitor. Mild renal dysfunction and elevated sugars with improved electrolytes.  Objective:  Vital Signs in the last 24 hours: Temp:  [98.1 F (36.7 C)-98.7 F (37.1 C)] 98.7 F (37.1 C) (12/16 0734) Pulse Rate:  [64-105] 86 (12/16 0734) Cardiac Rhythm:  [-] Heart block (12/16 0400) Resp:  [20-33] 26 (12/16 0800) BP: (94-161)/(37-127) 120/70 mmHg (12/16 0800) SpO2:  [94 %-100 %] 99 % (12/16 0800) Arterial Line BP: (118-194)/(55-84) 118/55 mmHg (12/16 0228) FiO2 (%):  [2 %-28 %] 2 % (12/16 0734) Weight:  [80.287 kg (177 lb)] 80.287 kg (177 lb) (12/15 2000)  Physical Exam: BP Readings from Last 1 Encounters:  09/16/13 120/70     Wt Readings from Last 1 Encounters:  09/15/13 80.287 kg (177 lb)    Weight change:   HEENT: [place]/AT, Eyes-Brown, PERL, EOMI, Conjunctiva-Pink, Sclera-Non-icteric Neck: No JVD, No bruit, Trachea midline. Lungs:  Clear, Bilateral. Cardiac:  Regular rhythm, normal S1 and S2, no S3.  Abdomen:  Soft, non-tender. Extremities:  No edema present. No cyanosis. No clubbing. CNS: AxOx2, Cranial nerves grossly intact, moves all 4 extremities. Right handed. Skin: Warm and dry.   Intake/Output from previous day: 12/15 0701 - 12/16 0700 In: 1932.9 [I.V.:1932.9] Out: 2300 [Urine:2300]    Lab Results: BMET    Component Value Date/Time   NA 131* 09/16/2013 0435   K 4.5 09/16/2013 0435   CL 97 09/16/2013 0435   CO2 21 09/16/2013 0435   GLUCOSE 349* 09/16/2013 0435   BUN 37* 09/16/2013 0435   CREATININE 1.53* 09/16/2013 0435   CALCIUM 8.7 09/16/2013 0435   GFRNONAA 43* 09/16/2013 0435   GFRAA 50* 09/16/2013 0435   CBC    Component Value Date/Time   WBC 14.4* 09/16/2013 0435   RBC 3.86* 09/16/2013 0435   HGB 10.8* 09/16/2013 0435   HCT 32.6* 09/16/2013 0435   PLT 267 09/16/2013 0435   MCV 84.5 09/16/2013 0435   MCH 28.0 09/16/2013 0435   MCHC 33.1  09/16/2013 0435   RDW 15.0 09/16/2013 0435   LYMPHSABS 1.8 09/15/2013 2105   MONOABS 1.6* 09/15/2013 2105   EOSABS 0.0 09/15/2013 2105   BASOSABS 0.0 09/15/2013 2105   CARDIAC ENZYMES No results found for this basename: CKTOTAL, CKMB, CKMBINDEX, TROPONINI    Scheduled Meds: . albuterol      . ALPRAZolam  0.25 mg Oral BID  . amiodarone  200 mg Oral BID  . aspirin EC  81 mg Oral Daily  . aspirin  81 mg Oral Daily  . atorvastatin  80 mg Oral Daily  . atorvastatin  80 mg Oral q1800  . insulin aspart  0-9 Units Subcutaneous TID WC  . insulin glargine  30 Units Subcutaneous BID  . insulin glulisine  20 Units Subcutaneous TID AC  . Insulin Lispro Prot & Lispro  15 Units Subcutaneous TID AC  . isosorbide mononitrate  30 mg Oral Daily  . metoprolol tartrate  12.5 mg Oral BID  . sertraline  50 mg Oral QHS  . Ticagrelor  90 mg Oral BID  . Ticagrelor  90 mg Oral BID   Continuous Infusions:  PRN Meds:.acetaminophen, acetaminophen, nitroGLYCERIN, ondansetron (ZOFRAN) IV, ondansetron (ZOFRAN) IV  Assessment/Plan: Acute inferior wall myocardial infarction  PTCA/Stent RCA Coronary artery disease  status post CABG   Hypertension  Insulin requiring diabetes mellitus  Hypercholesteremia  History of CVA  Degenerative joint disease  Change to  oral medications to discontinue venous sheath in groin. Increase activity post rest.   LOS: 1 day    Orpah Cobb  MD  09/16/2013, 8:23 AM     Feeling better

## 2013-09-16 NOTE — Progress Notes (Signed)
Rt went in to give pt tx again.  Pt let rt place tx on him but pulled it off again within a minute. Rt went in and dc'd txs at this time.  Possibly a langage barrier issue.

## 2013-09-16 NOTE — Progress Notes (Signed)
Subjective:  Patient denies any chest pain or shortness of breath. Had episode of A. fib last night converted back into sinus rhythm. No Mobitz type I second-degree heart block on the monitor with occasional junctional escape rhythm.  Objective:  Vital Signs in the last 24 hours: Temp:  [98.1 F (36.7 C)-98.7 F (37.1 C)] 98.7 F (37.1 C) (12/16 0734) Pulse Rate:  [64-105] 86 (12/16 0734) Resp:  [20-33] 22 (12/16 1000) BP: (88-161)/(37-127) 94/63 mmHg (12/16 1000) SpO2:  [94 %-100 %] 100 % (12/16 1000) Arterial Line BP: (118-194)/(55-84) 118/55 mmHg (12/16 0228) FiO2 (%):  [2 %-28 %] 2 % (12/16 0734) Weight:  [80.287 kg (177 lb)] 80.287 kg (177 lb) (12/15 2000)  Intake/Output from previous day: 12/15 0701 - 12/16 0700 In: 1932.9 [I.V.:1932.9] Out: 2300 [Urine:2300] Intake/Output from this shift: Total I/O In: 200.1 [I.V.:200.1] Out: -   Physical Exam: Neck: no adenopathy, no carotid bruit, no JVD and supple, symmetrical, trachea midline Lungs: clear to auscultation bilaterally Heart: regular rate and rhythm, S1, S2 normal, no murmur, click, rub or gallop Abdomen: soft, non-tender; bowel sounds normal; no masses,  no organomegaly Extremities: extremities normal, atraumatic, no cyanosis or edema and Right groin dressing dry  Lab Results:  Recent Labs  09/15/13 2105 09/16/13 0435  WBC 14.1* 14.4*  HGB 11.8* 10.8*  PLT 288 267    Recent Labs  09/15/13 2105 09/16/13 0435  NA 126* 131*  K 4.9 4.5  CL 94* 97  CO2 16* 21  GLUCOSE 457* 349*  BUN 36* 37*  CREATININE 1.30 1.53*   No results found for this basename: TROPONINI, CK, MB,  in the last 72 hours Hepatic Function Panel  Recent Labs  09/15/13 2105  PROT 6.7  ALBUMIN 2.9*  AST 55*  ALT 32  ALKPHOS 63  BILITOT 0.7    Recent Labs  09/16/13 0435  CHOL 198   No results found for this basename: PROTIME,  in the last 72 hours  Imaging: Imaging results have been reviewed and Dg Chest Port 1v Same  Day  09/15/2013   CLINICAL DATA:  Shortness of breath.  EXAM: PORTABLE CHEST - 1 VIEW SAME DAY  COMPARISON:  02/01/2012.  FINDINGS: The cardiac silhouette, mediastinal and hilar contours are within normal limits and stable. Stable surgical changes from bypass surgery. Diffuse increased interstitial markings and peribronchial thickening likely due to pulmonary edema or severe bronchitis. No pleural effusion. The bony thorax is intact.  IMPRESSION: Interstitial pulmonary edema versus severe bronchitis. No definite effusions or cardiac enlargement.   Electronically Signed   By: Loralie Champagne M.D.   On: 09/15/2013 20:56    Cardiac Studies:  Assessment/Plan:  Acute inferior wall myocardial infarction status post PTCA stenting 100% occluded RCA with excellent results Multivessel coronary artery disease with critical stenosis and saphenous when graft to OM2 Coronary artery disease status post CABG in the past as above  Hypertension  Insulin requiring diabetes matters  Hypercholesteremia  History of CVA  Degenerative joint disease Acute on chronic kidney disease stage II secondary to contrast-induced  Plan Continue present management Encourage by mouth fluid intake I will sign off please call if needed   LOS: 1 day    Dickie Labarre N 09/16/2013, 10:31 AM

## 2013-09-16 NOTE — Progress Notes (Signed)
Hypoglycemic Event  CBG: 41  Treatment: D50 IV 50 mL  Symptoms: Pale, Sweaty and Shaky, & somnolent  Follow-up CBG: Time:2146 CBG Result:200  Possible Reasons for Event: Inadequate meal intake  Comments/MD notified: Hypoglycemic protocol followed. Pt awake & responsive after treatment. Lantus insulin held.    Ruffin Pyo  Remember to initiate Hypoglycemia Order Set & complete

## 2013-09-16 NOTE — Care Management Note (Addendum)
    Page 1 of 2   09/18/2013     3:29:42 PM   CARE MANAGEMENT NOTE 09/18/2013  Patient:  Calvin Perez, Calvin Perez   Account Number:  [id number]  Date Initiated:  09/16/2013  Documentation initiated by:  Junius Creamer  Subjective/Objective Assessment:   adm w mi     Action/Plan:   lives w wife, pcp dr Reather Littler   Anticipated DC Date:  09/18/2013   Anticipated DC Plan:  HOME W HOME HEALTH SERVICES      DC Planning Services  CM consult  Medication Assistance      [facility] Choice  HOME HEALTH   Choice offered to / List presented to:  C-1 Patient        HH arranged  HH-2 PT      [facility] agency  [facility]   Status of service:   Medicare Important Message given?   (If response is "NO", the following Medicare IM given date fields will be blank) Date Medicare IM given:   Date Additional Medicare IM given:    Discharge Disposition:  HOME W HOME HEALTH SERVICES  Per UR Regulation:  Reviewed for med. necessity/level of care/duration of stay  If discussed at Long Length of Stay Meetings, dates discussed:    Comments:  09/18/13 Ceaira Ernster,RN,BSN [phone] PT FOR DC HOME TODAY WITH WIFE.  WILL NEED HH FOLLOW UP; PREFERS [facility] FOR HH NEEDS.  REFERRAL TO [facility] FOR HH FOLLOW UP.  START OF CARE 24-48H POST DC DATE.  12/16 1022a debbie dowell rn,bsn left pt brilinta 30day free and copay assist card. pt in donut hole his copay is 142.68. after first of year will have 200.00 out of pocket then copay will be 45.00 per month.

## 2013-09-16 NOTE — Cardiovascular Report (Signed)
NAMECEASAR, Calvin Perez NO.:  [id number]  MEDICAL RECORD NO.:  [id number]  LOCATION:  2H01C                        FACILITY:  [facility]  PHYSICIAN:  Calvin Perez, M.D. DATE OF BIRTH:  26-Feb-1941  DATE OF PROCEDURE:  09/15/2013 DATE OF DISCHARGE:                           CARDIAC CATHETERIZATION   PROCEDURE: 1. Left cardiac cath with selective left and right coronary     angiography, left ventriculography via right groin using Judkins     technique. 2. Successful PTCA to proximal and mid RCA using initially 2.5 x 12-mm     long TREK balloon and then 3.0 x 20 mm long TREK balloon and then     3.5 x 20 mm long [place] TREK balloon. 3. Successful deployment of 3.5 x 38 mm long XIENCE Alpine drug-     eluting stent in proximal and mid RCA using assistance with     GuideLiner catheter. 4. Successful postdilatation of the stent using same 3.5 x 20 mm long      TREK balloon.  INDICATION FOR THE PROCEDURE:  [name] is 72 year old male with past medical history significant for coronary artery disease, status post CABG in 2006.  He had LIMA to LAD, saphenous vein graft to diagonal 1, saphenous vein graft to obtuse marginal 2, saphenous vein graft to PDA, hypertension, insulin-requiring diabetes mellitus, hypercholesteremia, degenerative joint disease, history of CVA in the past, history of PCI to LAD in the past, he came to the [facility] as code STEMI was called.  The patient complained of retrosternal chest pain for last 2-3 days off and on.  Today, the pain got worse; so went to PMD's office and had EKG done, which showed normal sinus rhythm with first-degree AV block with ST elevation in leads 2, 3, aVF, and reciprocal ST depression and lead 1 aVL suggestive of acute inferior wall myocardial infarction. The patient denies any nausea, vomiting, and diaphoresis.  Denies any shortness of breath.  Denies palpitation, lightheadedness, or syncope. He had due to  typical anginal chest pain and EKG changes discussed briefly with the patient regarding left cath, possible PTCA stenting, its risks and benefits, i.e., death, MI, stroke, need for emergency CABG, local vascular complications, etc., and consented for PCI.  PROCEDURE IN DETAIL:  After obtaining the informed consent, the patient was directly brought to the cath lab and was placed on fluoroscopy table.  Right groin was prepped and draped in usual fashion.  Xylocaine 1% was used for local anesthesia in the right groin.  With the help of thin wall needle, 6-French arterial and venous sheaths were placed as patient did not had any IV access.  Next, a 6-French left Judkins catheter was advanced over the wire under fluoroscopic guidance up to the ascending aorta.  Wire was pulled out.  The catheter was aspirated and connected to the Manifold.  Catheter was further advanced and engaged into left coronary ostium.  Multiple views of the left system were taken.  Next, catheter was disengaged and was pulled out over the wire and was replaced with 6-French right Judkins catheter, which was advanced over the wire under fluoroscopic guidance up to the ascending aorta.  Wire was pulled out.  The catheter was aspirated and connected to the Manifold.  Catheter was further advanced and engaged into right coronary ostium.  The single view of right coronary artery was obtained. Next, catheter was disengaged and was engaged into saphenous vein graft to diagonal 1.  A single view of this graft was obtained.  Next, catheter was disengaged and was engaged into saphenous vein graft to OM2.  A single view of this graft was taken.  Next, the catheter was disengaged and was advanced under fluoroscopic guidance up to the left subclavian artery.  This catheter was engaged into LIMA to LAD.  A single view of this graft was taken.  Next, catheter was disengaged and was pulled out over the wire and was replaced with  6-French right bypass catheter, which could not be engaged into saphenous vein graft to PDA. Then; multipurpose guiding catheter was used to engage into the ostium of saphenous vein graft to PDA.  A single view of this graft was taken, which was occluded at the ostium.  Next, this catheter was pulled out and was replaced with pigtail catheter at the end of the procedure. This catheter was advanced over the wire under fluoroscopic guidance up to the ascending aorta.  Catheter was further advanced across the aortic valve into the LV.  LV pressures were recorded.  Next, LV graft was done in 30-degree RAO position.  Post-angiographic pressures were recorded from LV.  There was no gradient across the aortic valve.  Next, the pigtail catheter was pulled out over the wire.  Sheaths were aspirated and flushed.  Findings LV showed LVH.  There was severe hypokinesia in the basal and mid inferior wall.  There was 4+ MR.  EF of approximately 50-55%.  Left main was 30-40% diffusely diseased.  LAD has 30-40% ostial stenosis and 80-85% proximal long diffuse sequential stenosis and then 100% occluded beyond the proximal portion filling via collaterals from LIMA. Left circumflex has 80-85% proximal sequential stenosis.  OM1 is very, very small.  OM2 has 30-40% proximal stenosis and 70-75% distal stenosis.  RCA was 100% occluded beyond the proximal portion.  Saphenous vein graft to diagonal 1 was patent.  Saphenous vein graft to OM2 has 85- 90% proximal stenosis.  Vessel is less than 1.5 mm.  LIMA to LAD was patent, although native LAD was diffusely diseased distally.  Saphenous vein graft to PDA was 100% occluded at the ostium.  INTERVENTIONAL PROCEDURE:  Successful PTCA to proximal and mid RCA was done initially using 2.5 x 12-mm long TREK balloon and then 3.0 x 20 mm long TREK balloon for predilatation.  Then attempted to deploy a XIENCE 3.5 x 38 mm long XIENCE Alpine drug-eluting stent without  success as this stent could not be tracked down.  PTCA to mid and proximal RCA was done using 3.5 x 20 mm long high- pressure [place] TREK balloon going up to 10 atmospheric pressure and then again attempted to deploy a XIENCE Alpine drug-eluting stent without success as stent could not be tracked down despite having double wire.  Next, GuideLiner catheter was advanced over a 2.5 x 12-mm long TREK balloon up to the mid RCA and then balloon catheter was pulled out and was replaced with XIENCE Alpine 3.5 x 38 mm long drug-eluting stent, which was easily tracked down to proximal and mid RCA.  This stent was deployed at 10 atmospheric pressure.  This stent was postdilated using 3.5 x 20 mm long [place] TREK balloon  going up to 15-18 atmospheric pressure. Lesion dilated from 100% to 0% residual with excellent TIMI grade 3 distal flow without evidence of dissection or distal embolization.  The patient received weight based Angiomax and 180 mg of Brilinta prior to the procedure.  The patient had episode of hypotension, bradycardia, and AFib with RVR requiring atropine total of 1 mg and amiodarone 150 mg bolus and also was started on Levophed drip, which is being weaned off. The patient tolerated procedure well.  There were no complications.  The patient was transferred to CCU in stable condition.     Calvin Perez, M.D.     MNH/MEDQ  D:  09/15/2013  T:  09/16/2013  Job:  [id number]

## 2013-09-16 NOTE — CV Procedure (Signed)
(  Cath/PTCA stenting report dictated on 09/15/2013 dictation number is [phone]

## 2013-09-16 NOTE — Progress Notes (Signed)
Cardiac Rehab 1500 Came to ambulate pt. He still has venous sheath and is on bedrest. We will follow pt tomorrow. Beatrix Fetters, RN 09/16/2013 3:36 PM

## 2013-09-16 NOTE — Progress Notes (Signed)
Inpatient Diabetes Program Recommendations  AACE/ADA: New Consensus Statement on Inpatient Glycemic Control (2013)  Target Ranges:  Prepandial:   less than 140 mg/dL      Peak postprandial:   less than 180 mg/dL (1-2 hours)      Critically ill patients:  140 - 180 mg/dL  Results for Calvin Perez, Calvin Perez (MRN [id number]) as of 09/16/2013 10:04  Ref. Range 09/15/2013 19:43 09/15/2013 21:05 09/16/2013 07:37  Glucose-Capillary Latest Range: 70-99 mg/dL 409 (H) 811 (H) 914 (H)   Inpatient Diabetes Program Recommendations Diet: add carbohydrate modified to current heart healthy diet Thank you  Piedad Climes BSN, RN,CDE Inpatient Diabetes Coordinator [phone] (team pager)

## 2013-09-17 ENCOUNTER — Inpatient Hospital Stay (HOSPITAL_COMMUNITY): Payer: Medicare HMO

## 2013-09-17 LAB — GLUCOSE, CAPILLARY
Glucose-Capillary: 118 mg/dL — ABNORMAL HIGH (ref 70–99)
Glucose-Capillary: 161 mg/dL — ABNORMAL HIGH (ref 70–99)
Glucose-Capillary: 135 mg/dL — ABNORMAL HIGH (ref 70–99)

## 2013-09-17 LAB — BASIC METABOLIC PANEL
BUN: 39 mg/dL — ABNORMAL HIGH (ref 6–23)
Calcium: 8.7 mg/dL (ref 8.4–10.5)
GFR calc non Af Amer: 42 mL/min — ABNORMAL LOW (ref >90)
Glucose, Bld: 110 mg/dL — ABNORMAL HIGH (ref 70–99)
Sodium: 136 mEq/L (ref 135–145)

## 2013-09-17 LAB — IRON AND TIBC
Saturation Ratios: 6 % — ABNORMAL LOW (ref 20–55)
TIBC: 209 ug/dL — ABNORMAL LOW (ref 215–435)
UIBC: 196 ug/dL (ref 125–400)

## 2013-09-17 LAB — CBC
HCT: 31.8 % — ABNORMAL LOW (ref 39.0–52.0)
Hemoglobin: 10.5 g/dL — ABNORMAL LOW (ref 13.0–17.0)
MCH: 28.4 pg (ref 26.0–34.0)
MCHC: 33 g/dL (ref 30.0–36.0)
MCV: 85.9 fL (ref 78.0–100.0)
Platelets: 259 10*3/uL (ref 150–400)
RBC: 3.7 MIL/uL — ABNORMAL LOW (ref 4.22–5.81)
RDW: 15.1 % (ref 11.5–15.5)
WBC: 13.8 10*3/uL — ABNORMAL HIGH (ref 4.0–10.5)

## 2013-09-17 LAB — TSH: TSH: 0.529 u[IU]/mL (ref 0.350–4.500)

## 2013-09-17 LAB — TROPONIN I: Troponin I: 4.14 ng/mL (ref <0.30)

## 2013-09-17 MED ORDER — ALPRAZOLAM 0.25 MG PO TABS
0.2500 mg | ORAL_TABLET | Freq: Every day | ORAL | Status: DC
Start: 2013-09-17 — End: 2013-09-18
  Administered 2013-09-17: 0.25 mg via ORAL
  Filled 2013-09-17: qty 1

## 2013-09-17 MED ORDER — FUROSEMIDE 10 MG/ML IJ SOLN
40.0000 mg | Freq: Once | INTRAMUSCULAR | Status: AC
  Administered 2013-09-17: 40 mg via INTRAVENOUS
  Filled 2013-09-17: qty 4

## 2013-09-17 MED ORDER — INSULIN LISPRO PROT & LISPRO (50-50 MIX) 100 UNIT/ML ~~LOC~~ SUSP
5.0000 [IU] | Freq: Three times a day (TID) | SUBCUTANEOUS | Status: DC
  Administered 2013-09-17 – 2013-09-18 (×2): 5 [IU] via SUBCUTANEOUS
  Filled 2013-09-17 (×2): qty 10

## 2013-09-17 NOTE — Progress Notes (Signed)
Report called to Asher Muir, RN on 2000.

## 2013-09-17 NOTE — Progress Notes (Signed)
Pt transferred to 2w32 from 2H; pt ambulated from wheelchair to chair; pt very SOB on exertion; O2 sats RA 93%; O2 2L [place] placed for comfort; will cont. To monitor.

## 2013-09-17 NOTE — Progress Notes (Signed)
CARDIAC REHAB PHASE I   PRE:  Rate/Rhythm: 90 SR ? 1HB  BP:  Supine: 113/52  Sitting:   Standing:    SaO2: 100% 2.5L, 97%RA  MODE:  Ambulation: 150 ft   POST:  Rate/Rhythm: 118 ST ? 1HB  BP:  Supine:   Sitting: 142/54  Standing:    SaO2: 98%RA 0940-1000 Pt walked 150 ft on RA with rolling walker and asst x 2. Shakey. Tired easily. To recliner with call bell. Denied CP. Asked for water which was given. Deconditioned.    Luetta Nutting, RN BSN  09/17/2013 9:56 AM

## 2013-09-17 NOTE — Progress Notes (Signed)
Attempted to contact Calvin Perez's family. Unable to reach any family at this time. No message left since both mailboxes were full.

## 2013-09-17 NOTE — Progress Notes (Signed)
Subjective:  Had hypoglycemic episode with excess insulin intake, recovered with D 50 use. T max 100.4 F. Quiet this AM but answers appropriately.  Objective:  Vital Signs in the last 24 hours: Temp:  [97.2 F (36.2 C)-100.4 F (38 C)] 97.2 F (36.2 C) (12/17 0800) Cardiac Rhythm:  [-] Heart block (12/17 0800) Resp:  [18-26] 19 (12/17 0800) BP: (79-136)/(31-66) 113/50 mmHg (12/17 0800) SpO2:  [97 %-100 %] 100 % (12/17 0800)  Physical Exam: BP Readings from Last 1 Encounters:  09/17/13 113/50     Wt Readings from Last 1 Encounters:  09/15/13 80.287 kg (177 lb)    Weight change:   HEENT: [place]/AT, Eyes-Brown, PERL, EOMI, Conjunctiva-Pink, Sclera-Non-icteric Neck: No JVD, No bruit, Trachea midline. Lungs:  Clear, Bilateral. Cardiac:  Regular rhythm, normal S1 and S2, no S3.  Abdomen:  Soft, non-tender. Extremities:  No edema present. No cyanosis. No clubbing. CNS: AxOx3, Cranial nerves grossly intact, moves all 4 extremities. Right handed. Skin: Warm and dry.   Intake/Output from previous day: 12/16 0701 - 12/17 0700 In: 610.2 [P.O.:310; I.V.:250.2] Out: -     Lab Results: BMET    Component Value Date/Time   NA 136 09/17/2013 0636   K 4.3 09/17/2013 0636   CL 102 09/17/2013 0636   CO2 23 09/17/2013 0636   GLUCOSE 110* 09/17/2013 0636   BUN 39* 09/17/2013 0636   CREATININE 1.56* 09/17/2013 0636   CALCIUM 8.7 09/17/2013 0636   GFRNONAA 42* 09/17/2013 0636   GFRAA 49* 09/17/2013 0636   CBC    Component Value Date/Time   WBC 13.8* 09/17/2013 0636   RBC 3.70* 09/17/2013 0636   HGB 10.5* 09/17/2013 0636   HCT 31.8* 09/17/2013 0636   PLT 259 09/17/2013 0636   MCV 85.9 09/17/2013 0636   MCH 28.4 09/17/2013 0636   MCHC 33.0 09/17/2013 0636   RDW 15.1 09/17/2013 0636   LYMPHSABS 1.8 09/15/2013 2105   MONOABS 1.6* 09/15/2013 2105   EOSABS 0.0 09/15/2013 2105   BASOSABS 0.0 09/15/2013 2105   CARDIAC ENZYMES Lab Results  Component Value Date   TROPONINI 4.14*  09/17/2013    Scheduled Meds: . ALPRAZolam  0.25 mg Oral QHS  . amiodarone  200 mg Oral BID  . aspirin EC  81 mg Oral Daily  . atorvastatin  80 mg Oral Daily  . insulin glargine  30 Units Subcutaneous BID  . insulin lispro protamine-lispro  15 Units Subcutaneous TID AC  . isosorbide mononitrate  30 mg Oral Daily  . metoprolol tartrate  12.5 mg Oral BID  . sertraline  50 mg Oral QHS  . Ticagrelor  90 mg Oral BID   Continuous Infusions:  PRN Meds:.acetaminophen, acetaminophen, nitroGLYCERIN, ondansetron (ZOFRAN) IV, ondansetron (ZOFRAN) IV  Assessment/Plan: Acute inferior wall myocardial infarction  PTCA/Stent RCA  Coronary artery disease  status post CABG  Hypertension  Insulin requiring diabetes mellitus  Hypercholesteremia  History of CVA  Degenerative joint disease  Decrease Insulin dose and varieties. Increase activities.   LOS: 2 days    Orpah Cobb  MD  09/17/2013, 8:57 AM

## 2013-09-18 LAB — CBC
HCT: 30.7 % — ABNORMAL LOW (ref 39.0–52.0)
MCHC: 33.2 g/dL (ref 30.0–36.0)
Platelets: 281 10*3/uL (ref 150–400)
RDW: 14.9 % (ref 11.5–15.5)
WBC: 10.8 10*3/uL — ABNORMAL HIGH (ref 4.0–10.5)

## 2013-09-18 LAB — BASIC METABOLIC PANEL
BUN: 32 mg/dL — ABNORMAL HIGH (ref 6–23)
CO2: 27 mEq/L (ref 19–32)
Chloride: 99 mEq/L (ref 96–112)
GFR calc Af Amer: 55 mL/min — ABNORMAL LOW (ref >90)
GFR calc non Af Amer: 47 mL/min — ABNORMAL LOW (ref >90)
Potassium: 3.7 mEq/L (ref 3.5–5.1)
Sodium: 135 mEq/L (ref 135–145)

## 2013-09-18 LAB — GLUCOSE, CAPILLARY
Glucose-Capillary: 256 mg/dL — ABNORMAL HIGH (ref 70–99)
Glucose-Capillary: 275 mg/dL — ABNORMAL HIGH (ref 70–99)

## 2013-09-18 MED ORDER — TICAGRELOR 90 MG PO TABS: 90.0000 mg | ORAL_TABLET | Freq: Two times a day (BID) | ORAL | Status: DC

## 2013-09-18 MED ORDER — FERROUS SULFATE 325 (65 FE) MG PO TABS
325.0000 mg | ORAL_TABLET | Freq: Every day | ORAL | Status: DC
  Administered 2013-09-18: 325 mg via ORAL
  Filled 2013-09-18 (×2): qty 1

## 2013-09-18 MED ORDER — FERROUS SULFATE 325 (65 FE) MG PO TABS: 325.0000 mg | ORAL_TABLET | Freq: Every day | ORAL | Status: DC

## 2013-09-18 MED ORDER — METOPROLOL TARTRATE 12.5 MG HALF TABLET: 12.5000 mg | ORAL_TABLET | Freq: Two times a day (BID) | ORAL | Status: AC

## 2013-09-18 MED ORDER — GLUCERNA SHAKE PO LIQD
237.0000 mL | Freq: Three times a day (TID) | ORAL | Status: DC
Start: 2013-09-18 — End: 2013-09-18
  Administered 2013-09-18: 237 mL via ORAL

## 2013-09-18 MED ORDER — AMIODARONE HCL 200 MG PO TABS: 200.0000 mg | ORAL_TABLET | Freq: Every day | ORAL | Status: DC

## 2013-09-18 MED ORDER — GLUCERNA SHAKE PO LIQD: 237.0000 mL | Freq: Two times a day (BID) | ORAL | Status: DC

## 2013-09-18 MED ORDER — INSULIN GLARGINE 100 UNIT/ML ~~LOC~~ SOLN: 30.0000 [IU] | Freq: Two times a day (BID) | SUBCUTANEOUS | Status: DC

## 2013-09-18 MED ORDER — NITROGLYCERIN 0.4 MG SL SUBL: 0.4000 mg | SUBLINGUAL_TABLET | SUBLINGUAL | Status: AC | PRN

## 2013-09-18 MED ORDER — ISOSORBIDE MONONITRATE ER 30 MG PO TB24: 30.0000 mg | ORAL_TABLET | Freq: Every day | ORAL | Status: AC

## 2013-09-18 MED ORDER — ALPRAZOLAM 0.25 MG PO TABS: 0.2500 mg | ORAL_TABLET | Freq: Every day | ORAL | Status: DC

## 2013-09-18 NOTE — Progress Notes (Signed)
INITIAL NUTRITION ASSESSMENT  DOCUMENTATION CODES Per approved criteria  -Not Applicable   INTERVENTION: Glucerna Shake po BID, each supplement provides 220 kcal and 10 grams of protein  NUTRITION DIAGNOSIS: Inadequate oral intake related to uncontrolled DM as evidenced by wt loss.   Goal: Pt to meet >/= 90% of their estimated nutrition needs   Monitor:  Wt, po intake, labs  Reason for Assessment: MST  72 y.o. male  Admitting Dx: <principal problem not specified>  ASSESSMENT: 72 years old male has retrosternal chest pain x 2 days without radiation or shortness of breath. He had LAD angioplasty long time ago. In 2006 he had 4 vessel by pass graft surgery. He has long standing history of diabetes, poorly controlled at times due to non-compliance.   Pt reports his usual body weight as 180 lbs. This reflects a 3-lb wt loss. He says that he has had a poor appetite and that he does not like to eat. Pt said that he would accept Glucerna Shakes. Meal completion is 20%  Height: Ht Readings from Last 1 Encounters:  09/15/13 5\' 5"  (1.651 m)    Weight: Wt Readings from Last 1 Encounters:  09/15/13 177 lb (80.287 kg)    Ideal Body Weight: 61.5 kg  % Ideal Body Weight: 131%  Wt Readings from Last 10 Encounters:  09/15/13 177 lb (80.287 kg)  09/15/13 177 lb (80.287 kg)  07/01/13 171 lb (77.565 kg)  06/23/13 170 lb 4.8 oz (77.248 kg)  04/30/13 175 lb (79.379 kg)  04/17/13 173 lb 1.6 oz (78.518 kg)  02/02/12 181 lb 1.6 oz (82.146 kg)    Usual Body Weight: 180 lbs  % Usual Body Weight: 98%  BMI:  Body mass index is 29.45 kg/(m^2).  Estimated Nutritional Needs: Kcal: 2000-2150 Protein: 90-100 g Fluid: 2.0-2.2 L/day  Skin: WNL  Diet Order: Carb Control   Intake/Output Summary (Last 24 hours) at 09/18/13 1129 Last data filed at 09/18/13 0145  Gross per 24 hour  Intake    120 ml  Output   1000 ml  Net   -880 ml    Last BM: none recorded   Labs:   Recent  Labs Lab 09/15/13 1805  09/15/13 2105 09/16/13 0435 09/17/13 0636 09/18/13 0606  NA 126*  --  126* 131* 136 135  K 5.9*  --  4.9 4.5 4.3 3.7  CL 98  --  94* 97 102 99  CO2  --   < > 16* 21 23 27   BUN 36*  --  36* 37* 39* 32*  CREATININE 1.40*  --  1.30 1.53* 1.56* 1.43*  CALCIUM  --   < > 8.8 8.7 8.7 8.7  MG  --   --  2.0  --   --   --   GLUCOSE 575*  --  457* 349* 110* 120*  < > = values in this interval not displayed.  CBG (last 3)   Recent Labs  09/17/13 1701 09/17/13 2107 09/18/13 0619  GLUCAP 135* 173* 113*    Scheduled Meds: . ALPRAZolam  0.25 mg Oral QHS  . amiodarone  200 mg Oral BID  . aspirin EC  81 mg Oral Daily  . atorvastatin  80 mg Oral Daily  . ferrous sulfate  325 mg Oral Q breakfast  . insulin glargine  30 Units Subcutaneous BID  . insulin lispro protamine-lispro  5 Units Subcutaneous TID AC  . isosorbide mononitrate  30 mg Oral Daily  . metoprolol tartrate  12.5  mg Oral BID  . sertraline  50 mg Oral QHS  . Ticagrelor  90 mg Oral BID    Continuous Infusions:   Past Medical History  Diagnosis Date  . Hypertension   . Arthritis   . Dyslipidemia   . Coronary artery disease   . Diabetes mellitus     insulin dependent    Past Surgical History  Procedure Laterality Date  . Open heart surgery  1998  . Coronary artery bypass graft    . Cardiac catheterization      Ebbie Latus RD, LDN

## 2013-09-18 NOTE — Discharge Summary (Signed)
, Physician Discharge Summary  Patient ID: Calvin Perez MRN: [id number] DOB/AGE: [birth year] 72 y.o.  Admit date: 09/15/2013 Discharge date: 09/18/2013  Admission Diagnoses: Acute inferior wall myocardial infarction  PTCA/Stent RCA  Coronary artery disease  status post CABG  Hypertension  Insulin requiring diabetes mellitus, II  Hypercholesteremia  History of CVA  Degenerative joint disease  Discharge Diagnoses:  Principle problem: * Acute inferior wall MI * Acute left heart systolic failure PTCA/Stent RCA  Coronary artery disease  status post CABG  Hypertension  Insulin requiring diabetes mellitus, II  Hypercholesteremia  History of CVA  Degenerative joint disease CKD, II Iron deficiency anemia  Discharged Condition: fair  Hospital Course: 72 years old male had retrosternal chest pain x 2 days without radiation or shortness of breath. He had LAD angioplasty long time ago. In 2006 he had 4 vessel by pass graft surgery. He has long standing history of diabetes, poorly controlled at times due to non-compliance. He underwent stent placement in RCA by Dr. Sharyn Lull. He had acute left heart systolic failure post procedure. He responded well to IV lasix. His activity was gradually increased and his Insulin dose was decreased due to hypoglycemia from poor oral intake. No ace-inhibitor due to renal dysfunction.  Consults: cardiology  Significant Diagnostic Studies: labs: Low sodium of 126 and high potassium of 5.9 meq. Improving to 135 and 3.7 meq. Respectively. Mild renal dysfunction with Creatinine of 1.43  EKG-SR, Acute inferior wall MI  PTCA/Stent RCA.-09/15/2013  CXR- improving pulmonary edema on 09/17/2013 compared to 09/15/2013  Treatments: cardiac meds: metoprolol, amiodarone and Atorvastatin and anticoagulation: ASA, heparin and Ticagrelor(Brilinta). Ferrous sulfate for iron deficiency.  Discharge Exam: Blood pressure 98/45, pulse 90, temperature 98.2 F (36.8 C),  temperature source Oral, resp. rate 18, height 5\' 5"  (1.651 m), weight 80.287 kg (177 lb), SpO2 99.00%.  HEENT: [place]/AT, Eyes-Brown, PERL, EOMI, Conjunctiva-Pink, Sclera-Non-icteric  Neck: No JVD, No bruit, Trachea midline.  Lungs: Clear, Bilateral.  Cardiac: Regular rhythm, normal S1 and S2, no S3.  Abdomen: Soft, non-tender.  Extremities: No edema present. No cyanosis. No clubbing.  CNS: AxOx3, Cranial nerves grossly intact, moves all 4 extremities. Right handed.  Skin: Warm and dry.  Disposition: 01-Home or Self Care  Discharge Orders   Future Orders Complete By Expires   Amb Referral to Cardiac Rehabilitation  As directed        Medication List    STOP taking these medications       APIDRA SOLOSTAR 100 UNIT/ML Sopn  Generic drug:  Insulin Glulisine     ibuprofen 200 MG tablet  Commonly known as:  ADVIL,MOTRIN     valsartan 80 MG tablet  Commonly known as:  DIOVAN      TAKE these medications       ALPRAZolam 0.25 MG tablet  Commonly known as:  XANAX  Take 1 tablet (0.25 mg total) by mouth at bedtime.     amiodarone 200 MG tablet  Commonly known as:  PACERONE  Take 1 tablet (200 mg total) by mouth daily.     aspirin 81 MG tablet  Take 81 mg by mouth daily.     atorvastatin 80 MG tablet  Commonly known as:  LIPITOR  Take 1 tablet (80 mg total) by mouth daily.     calcium carbonate 600 MG Tabs tablet  Commonly known as:  OS-CAL  Take 1,200 mg by mouth daily with breakfast.     feeding supplement (GLUCERNA SHAKE) Liqd  Take 237 mLs by  mouth 2 (two) times daily between meals.     ferrous sulfate 325 (65 FE) MG tablet  Take 1 tablet (325 mg total) by mouth daily with breakfast.     insulin glargine 100 UNIT/ML injection  Commonly known as:  LANTUS  Inject 0.3 mLs (30 Units total) into the skin 2 (two) times daily.     isosorbide mononitrate 30 MG 24 hr tablet  Commonly known as:  IMDUR  Take 1 tablet (30 mg total) by mouth daily.     metoprolol tartrate  12.5 mg Tabs tablet  Commonly known as:  LOPRESSOR  Take 0.5 tablets (12.5 mg total) by mouth 2 (two) times daily.     nitroGLYCERIN 0.4 MG SL tablet  Commonly known as:  NITROSTAT  Place 1 tablet (0.4 mg total) under the tongue every 5 (five) minutes x 3 doses as needed for chest pain.     Ticagrelor 90 MG Tabs tablet  Commonly known as:  BRILINTA  Take 1 tablet (90 mg total) by mouth 2 (two) times daily.     VICKS VAPORUB EX  Apply 1 application topically daily as needed.           Follow-up Information   Follow up with [facility], MD. Schedule an appointment as soon as possible for a visit in 1 week.   Specialty:  Endocrinology   Contact information:   [address] [facility] [place] [address] [phone]       Follow up with [facility], MD. Schedule an appointment as soon as possible for a visit in 4 days. (Bring sugar record)    Specialty:  Cardiology   Contact information:   [address] [place] [address] [phone]       Signed: Ricki Rodriguez 09/18/2013, 5:12 PM

## 2013-09-18 NOTE — Evaluation (Signed)
Physical Therapy Evaluation Patient Details Name: Calvin Perez MRN: [id number] DOB: 03-21-1941 Today's Date: 09/18/2013 Time: 0850-0908 PT Time Calculation (min): 18 min  PT Assessment / Plan / Recommendation History of Present Illness  72 years old male has retrosternal chest pain x 2 days without radiation or shortness of breath. He had LAD angioplasty long time ago. In 2006 he had 4 vessel by pass graft surgery. He has long standing history of diabetes, poorly controlled at times due to non-compliance. +MI;  Had cardiac cath with PTCA/stent.   Clinical Impression  Pt admitted with above. Pt currently with functional limitations due to the deficits listed below (see PT Problem List).  Pt will benefit from skilled PT to increase their independence and safety with mobility to allow discharge to the venue listed below. Pt very unsteady on eval and refusing education on use of an assitive device. He insists his wife will be with him 24/7 (wife currently not present). Reports he is off-balance due to lack of nutrition as he has not been eating hospital food. Will follow.      PT Assessment  Patient needs continued PT services    Follow Up Recommendations  Home health PT;Supervision/Assistance - 24 hour    Does the patient have the potential to tolerate intense rehabilitation      Barriers to Discharge        Equipment Recommendations  Rolling walker with 5" wheels (pt adamantly refusing DME)    Recommendations for Other Services OT consult   Frequency Min 3X/week    Precautions / Restrictions Precautions Precautions: Fall Precaution Comments: Pt denies h/o falls, however is very unsteady   Pertinent Vitals/Pain SaO2 97% on 2L, 92% on RA at EOB, with walking 92-96% on RA; in chair resting 96% on RA HR 80-105     Mobility  Bed Mobility Bed Mobility: Supine to Sit;Sitting - Scoot to Edge of Bed Supine to Sit: 4: Min guard;HOB flat Sitting - Scoot to [place] of Bed: 7:  Independent Details for Bed Mobility Assistance: pt with incr effort and time to come to EOB Transfers Transfers: Sit to Stand;Stand to Sit Sit to Stand: 4: Min guard Stand to Sit: 4: Min guard Details for Transfer Assistance: pt generally unsteady throughout session Ambulation/Gait Ambulation/Gait Assistance: 4: Min assist Ambulation Distance (Feet): 90 Feet Assistive device: None Ambulation/Gait Assistance Details: Pt with wide stance, nearly antalgic-appearance; ? numbness in feet (although detected light touch); stagger to Lt Gait velocity: decr and unable to vary his speed Stairs: No    Exercises     PT Diagnosis: Difficulty walking  PT Problem List: Decreased activity tolerance;Decreased balance;Decreased mobility;Decreased safety awareness;Decreased knowledge of use of DME;Cardiopulmonary status limiting activity;Impaired sensation PT Treatment Interventions: DME instruction;Gait training;Stair training;Functional mobility training;Therapeutic activities;Balance training;Therapeutic exercise;Patient/family education     PT Goals(Current goals can be found in the care plan section) Acute Rehab PT Goals Patient Stated Goal: go home today PT Goal Formulation: With patient Time For Goal Achievement: 09/25/13 Potential to Achieve Goals: Good  Visit Information  Last PT Received On: 09/18/13 Assistance Needed: +1 History of Present Illness: 72 years old male has retrosternal chest pain x 2 days without radiation or shortness of breath. He had LAD angioplasty long time ago. In 2006 he had 4 vessel by pass graft surgery. He has long standing history of diabetes, poorly controlled at times due to non-compliance. +MI;  Had cardiac cath with PTCA/stent.       Prior Functioning  Home Living  Family/patient expects to be discharged to:: Private residence Living Arrangements: Spouse/significant other;Children (daughter works) Available Help at Discharge: Family;Available 24  hours/day Type of Home: House Home Access: Stairs to enter Home Layout: Two level;Bed/bath upstairs Home Equipment: None Prior Function Level of Independence: Independent Comments: walking 2-3 miles/day PTA per pt; denies h/o imbalance Communication Communication: Prefers language other than English;HOH    Cognition  Cognition Arousal/Alertness: Awake/alert Behavior During Therapy: WFL for tasks assessed/performed Overall Cognitive Status: Impaired/Different from baseline Area of Impairment: Safety/judgement Safety/Judgement: Decreased awareness of safety;Decreased awareness of deficits General Comments: pt adamant about not using an assistive device    Extremity/Trunk Assessment Upper Extremity Assessment Upper Extremity Assessment: Overall WFL for tasks assessed Lower Extremity Assessment Lower Extremity Assessment: Overall WFL for tasks assessed RLE Sensation: history of peripheral neuropathy (able to detect simple light touch) LLE Sensation: history of peripheral neuropathy   Balance Balance Balance Assessed: Yes Static Sitting Balance Static Sitting - Balance Support: No upper extremity supported;Feet supported Static Sitting - Level of Assistance: 7: Independent Static Standing Balance Static Standing - Balance Support: No upper extremity supported Static Standing - Level of Assistance: 5: Stand by assistance;4: Min assist (as fatigued, incr assist) [place] - Eyes Opened: 0 (pt with +sway (esp post-right) requiring min assist)  End of Session PT - End of Session Equipment Utilized During Treatment: Gait belt Activity Tolerance: Patient limited by fatigue Patient left: in chair;with call bell/phone within reach Nurse Communication: Mobility status;Other (comment) (left on RA with SaO2 96%)  GP     Norris Bodley 09/18/2013, 9:38 AM Pager [phone]

## 2013-09-18 NOTE — Progress Notes (Signed)
Spoke with pt, daughter and wife re MI ed. Daughter translated. Pt has struggled with losing his independence since his stroke. Discussed increasing ex slowly and allowing HHPT to guide him with gait training. Pt agreed to use RW at home until his therapist releases him from it. Interested in [facility] and will send referral to [place] [facility]. [phone] Kristan Lochlann Mastrangelo CES, ACSM 3:32 PM 09/18/2013  

## 2013-09-18 NOTE — Progress Notes (Signed)
CARDIAC REHAB PHASE I   PRE:  Rate/Rhythm: 82 SR    BP: sitting 98/40    SaO2: 93 RA  MODE:  Ambulation: 400 ft   POST:  Rate/Rhythm: 107 ST    BP: sitting 120/60     SaO2: 95 RA  Pt ambulated assist x2 with gait belt. Began with RW but pt does not like RW and sts he will not use it at home. Walked 120 ft with RW then rest of walk without it. Held to gait belt. Pt unsteady. Struggled to control his foot placement. Pt lost balance with distractions. Do not feel like he is safe without RW but family sts he has been like this since his CVA and was walking to [facility] and working on TM daily. Apparently pt quit PT [facility] was to [facility]. Pt DOE with walking, esp after walk. To recliner. Denied CP. Will f/u for ed. [phone]   Elissa Lovett [place] CES, ACSM 09/18/2013 1:47 PM

## 2013-09-18 NOTE — Progress Notes (Signed)
Assessment unchanged. Discussed D/C instructions with pt and family including medication changes and f/u appointments. Pt and family verbalized understanding. RX given to pt. IV and tele removed. Pt left via W/C accompanied by NT with belongings.

## 2013-10-07 ENCOUNTER — Other Ambulatory Visit (INDEPENDENT_AMBULATORY_CARE_PROVIDER_SITE_OTHER): Payer: Medicare HMO

## 2013-10-07 DIAGNOSIS — E1165 Type 2 diabetes mellitus with hyperglycemia: Principal | ICD-10-CM

## 2013-10-07 DIAGNOSIS — IMO0001 Reserved for inherently not codable concepts without codable children: Secondary | ICD-10-CM

## 2013-10-07 DIAGNOSIS — E785 Hyperlipidemia, unspecified: Secondary | ICD-10-CM

## 2013-10-07 LAB — COMPREHENSIVE METABOLIC PANEL
ALK PHOS: 67 U/L (ref 39–117)
ALT: 22 U/L (ref 0–53)
AST: 20 U/L (ref 0–37)
Albumin: 4 g/dL (ref 3.5–5.2)
BUN: 14 mg/dL (ref 6–23)
CALCIUM: 9.4 mg/dL (ref 8.4–10.5)
CHLORIDE: 99 meq/L (ref 96–112)
CO2: 31 mEq/L (ref 19–32)
Creatinine, Ser: 1.2 mg/dL (ref 0.4–1.5)
GFR: 64.76 mL/min (ref >60.00)
GLUCOSE: 105 mg/dL — AB (ref 70–99)
POTASSIUM: 3.5 meq/L (ref 3.5–5.1)
SODIUM: 137 meq/L (ref 135–145)
TOTAL PROTEIN: 8.2 g/dL (ref 6.0–8.3)
Total Bilirubin: 0.5 mg/dL (ref 0.3–1.2)

## 2013-10-07 LAB — LDL CHOLESTEROL, DIRECT: Direct LDL: 220.2 mg/dL

## 2013-10-07 LAB — LIPID PANEL
Cholesterol: 289 mg/dL — ABNORMAL HIGH (ref 0–200)
HDL: 37.1 mg/dL — AB (ref >39.00)
Total CHOL/HDL Ratio: 8
Triglycerides: 235 mg/dL — ABNORMAL HIGH (ref 0.0–149.0)
VLDL: 47 mg/dL — AB (ref 0.0–40.0)

## 2013-10-08 LAB — FRUCTOSAMINE: FRUCTOSAMINE: 345 umol/L — AB (ref <285)

## 2013-10-09 ENCOUNTER — Other Ambulatory Visit: Payer: Self-pay | Admitting: *Deleted

## 2013-10-09 ENCOUNTER — Ambulatory Visit (INDEPENDENT_AMBULATORY_CARE_PROVIDER_SITE_OTHER): Payer: Medicare HMO | Admitting: Endocrinology

## 2013-10-09 ENCOUNTER — Encounter: Payer: Self-pay | Admitting: Endocrinology

## 2013-10-09 VITALS — BP 124/64 | HR 73 | Temp 97.6°F | Resp 12 | Wt 167.0 lb

## 2013-10-09 DIAGNOSIS — IMO0001 Reserved for inherently not codable concepts without codable children: Secondary | ICD-10-CM

## 2013-10-09 DIAGNOSIS — I2510 Atherosclerotic heart disease of native coronary artery without angina pectoris: Secondary | ICD-10-CM

## 2013-10-09 DIAGNOSIS — E1165 Type 2 diabetes mellitus with hyperglycemia: Principal | ICD-10-CM

## 2013-10-09 DIAGNOSIS — E785 Hyperlipidemia, unspecified: Secondary | ICD-10-CM

## 2013-10-09 DIAGNOSIS — E1159 Type 2 diabetes mellitus with other circulatory complications: Secondary | ICD-10-CM

## 2013-10-09 MED ORDER — INSULIN ASPART 100 UNIT/ML ~~LOC~~ SOLN: 25.0000 [IU] | Freq: Three times a day (TID) | SUBCUTANEOUS | Status: DC

## 2013-10-09 MED ORDER — ATORVASTATIN CALCIUM 80 MG PO TABS: 80.0000 mg | ORAL_TABLET | Freq: Every day | ORAL | Status: DC

## 2013-10-09 MED ORDER — GLUCOSE BLOOD VI STRP: ORAL_STRIP | Status: DC

## 2013-10-09 MED ORDER — COLESEVELAM HCL 625 MG PO TABS: ORAL_TABLET | ORAL | Status: DC

## 2013-10-09 NOTE — Patient Instructions (Addendum)
Please check blood sugars at least half the time about 2 hours after any meal and as directed on waking up. Please bring blood sugar monitor to each visit  Lantus 40 units in am only  Novolog 10 am, 20 at lunch and 10 at dinner  Welchol 3 tabs twice daily

## 2013-10-09 NOTE — Progress Notes (Signed)
Patient ID: Calvin Perez, male   DOB: 1941-05-13, 73 y.o.   MRN: [id number]  Reason for Appointment: Diabetes follow-up   History of Present Illness  Diagnosis: Type 2 DIABETES MELITUS, since 1984       Past history:  He has been on insulin regimen since about 2004. He has required large doses of insulin insulin persistently but with inadequate control usually Had been relatively intolerant to metformin and did not want to pay for Actos His hemoglobin A1c in the past has usually been about 8-9%  He usually has problems with  postprandial hyperglycemia despite taking large dose of mealtime insulin  RECENT history: During his recent hospitalization for MI in 12/14 his mealtime insulin was stopped and is only taking Lantus at bedtime His overall control has been persistently poor and A1c was still over 9% last month. Fructosamine high now This is despite his recent sugars being low in the morning frequently He has not monitored his postprandial or nonfasting readings at all since he was discharged Previously has been relatively noncompliant with followup and glucose monitoring also  Oral hypoglycemic drugs: None at present        Side effects from medications: ? GI side effects from metformin  Insulin regimen: Lantus 50  units at bedtime. Apidra: Stopped      Monitors blood glucose: Once a day.    Glucometer: One Touch.          Blood Glucose readings:  fasting median 153 with recent range 50-153 Glucose 255 PC supper on 09/19/13 Hypoglycemia frequency:  almost daily since 10/02/13 in the mornings around 9-10 AM            Meals: 3 meals per day.     he is generally eating vegetarian meals but his wife says he is getting an egg for breakfast and some chicken at dinner      Physical activity: exercise: Unable to walk because of gait imbalance           Complications: are: Atherosclerotic vascular disease, recent stroke     Lab Results  Component Value Date   HGBA1C 9.1* 09/15/2013    HGBA1C 9.5* 06/19/2013   HGBA1C 10.2* 04/17/2013   Lab Results  Component Value Date   MICROALBUR 2.8* 06/19/2013   LDLCALC 143* 09/16/2013   CREATININE 1.2 10/07/2013    HYPERTENSION:  he has previously been on multiple drugs including labetalol and Diovan but not clear if he is taking any medications Appears to be only on metoprolol with fairly good blood pressure today No change in renal function     Medication List       This list is accurate as of: 10/09/13 11:19 AM.  Always use your most recent med list.               ALPRAZolam 0.25 MG tablet  Commonly known as:  XANAX  Take 1 tablet (0.25 mg total) by mouth at bedtime.     amiodarone 200 MG tablet  Commonly known as:  PACERONE  Take 1 tablet (200 mg total) by mouth daily.     aspirin 81 MG tablet  Take 81 mg by mouth daily.     aspirin 325 MG tablet  325 mg.     atorvastatin 80 MG tablet  Commonly known as:  LIPITOR  Take 1 tablet (80 mg total) by mouth daily.     calcium carbonate 600 MG Tabs tablet  Commonly known as:  OS-CAL  Take  1,200 mg by mouth daily with breakfast.     feeding supplement (GLUCERNA SHAKE) Liqd  Take 237 mLs by mouth 2 (two) times daily between meals.     ferrous sulfate 325 (65 FE) MG tablet  Take 1 tablet (325 mg total) by mouth daily with breakfast.     insulin glargine 100 UNIT/ML injection  Commonly known as:  LANTUS  Inject 0.3 mLs (30 Units total) into the skin 2 (two) times daily.     isosorbide mononitrate 30 MG 24 hr tablet  Commonly known as:  IMDUR  Take 1 tablet (30 mg total) by mouth daily.     metoprolol tartrate 12.5 mg Tabs tablet  Commonly known as:  LOPRESSOR  Take 0.5 tablets (12.5 mg total) by mouth 2 (two) times daily.     nitroGLYCERIN 0.4 MG SL tablet  Commonly known as:  NITROSTAT  Place 1 tablet (0.4 mg total) under the tongue every 5 (five) minutes x 3 doses as needed for chest pain.     Ticagrelor 90 MG Tabs tablet  Commonly known as:  BRILINTA   Take 1 tablet (90 mg total) by mouth 2 (two) times daily.     VICKS VAPORUB EX  Apply 1 application topically daily as needed.        Allergies: No Known Allergies  Past Medical History  Diagnosis Date  . Hypertension   . Arthritis   . Dyslipidemia   . Coronary artery disease   . Diabetes mellitus     insulin dependent    Past Surgical History  Procedure Laterality Date  . Open heart surgery  1998  . Coronary artery bypass graft    . Cardiac catheterization      History reviewed. No pertinent family history.  Social History:  reports that he has never smoked. He has never used smokeless tobacco. He reports that he does not drink alcohol or use illicit drugs.  Review of Systems - Cardiovascular ROS: positive for - coronary artery disease, cerebrovascular disease  HYPERLIPIDEMIA:         The lipid abnormality consists of elevated LDL and this has been generally difficult to control with various drugs in the past probably because of noncompliance with medications and financial reasons.   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 Apparently is going to get some physical therapy again  He has had significant anemia previously   Examination:   BP 124/64  Pulse 73  Temp(Src) 97.6 F (36.4 C) (Oral)  Resp 12  Wt 167 lb (75.751 kg)  SpO2 96%  Body mass index is 27.79 kg/(m^2).   No pedal edema  Assesment/PLAN:    1. Diabetes type 2, uncontrolled - 250.02  The patient's diabetes control appears is difficult to assess since he has checked readings only in the morning His fructosamine indicates poor control even though fasting readings have been mostly low in the last  week Discussed that he does need mealtime coverage which was stopped in the hospital; previously had needed as much as 50 units of mealtime coverage at lunch His wife thinks he is getting more balanced meals with some protein but not consistently  Recommendations made today:  Start NovoLog with each meal, empirically will start with 10 units at breakfast and supper and 20 at lunch  Followup with nurse educator to help her insulin adjustment and discussed meal planning  Change Lantus to the morning for potentially better than during the daytime and less early morning hypoglycemia  Reduce Lantus dose by 10 units for now  Emphasized the need for monitoring blood sugars in between meals and after supper. To adjust the NovoLog based on these readings on the next visit  Have protein consistently with each meal including at lunchtime, may try adding yogurt  Discussed timing of this insulin, composition, actions of the 2 components, but sugar targets, glucose monitoring timings, potential for hypoglycemia, balanced diet Encouraged him to start some exercise, may walk with stick  2. HYPERCHOLESTEROLEMIA: He has been on generic Lipitor previously but not recently;  his lipids are still poorly controlled Discussed importance of treatment of his lipids and he will restart Lipitor He will try WelChol tablets instead of powder which she did not like  Hypertension: Currently on no medications from his list, since blood pressure is fairly good will not change any treatment. Consider restarting low dose Diovan because of poorly controlled diabetes  Counseling time over 50% of today's 25 minute visit   Emma Schupp 10/09/2013, 11:19 AM    Addendum: Labs done  Appointment on 10/07/2013  Component Date Value Range Status  . Fructosamine 10/07/2013 345* <285 umol/L Final   Comment:                            Variations in levels of serum proteins (albumin and immunoglobulins)                          may affect fructosamine results.                             . Cholesterol 10/07/2013 289* 0 - 200 mg/dL Final   ATP III Classification       Desirable:  < 200 mg/dL               Borderline High:  200 - 239 mg/dL          High:  > = 240 mg/dL  . Triglycerides 10/07/2013 235.0* 0.0 - 149.0 mg/dL Final   Normal:  <150 mg/dLBorderline High:  150 - 199 mg/dL  . HDL 10/07/2013 37.10* >39.00 mg/dL Final  . VLDL 10/07/2013 47.0* 0.0 - 40.0 mg/dL Final  . Total CHOL/HDL Ratio 10/07/2013 8   Final                  Men          Women1/2 Average Risk     3.4          3.3Average Risk          5.0          4.42X Average Risk          9.6          7.13X Average Risk  15.0          11.0                      . Sodium 10/07/2013 137  135 - 145 mEq/L Final  . Potassium 10/07/2013 3.5  3.5 - 5.1 mEq/L Final  . Chloride 10/07/2013 99  96 - 112 mEq/L Final  . CO2 10/07/2013 31  19 - 32 mEq/L Final  . Glucose, Bld 10/07/2013 105* 70 - 99 mg/dL Final  . BUN 10/07/2013 14  6 - 23 mg/dL Final  . Creatinine, Ser 10/07/2013 1.2  0.4 - 1.5 mg/dL Final  . Total Bilirubin 10/07/2013 0.5  0.3 - 1.2 mg/dL Final  . Alkaline Phosphatase 10/07/2013 67  39 - 117 U/L Final  . AST 10/07/2013 20  0 - 37 U/L Final  . ALT 10/07/2013 22  0 - 53 U/L Final  . Total Protein 10/07/2013 8.2  6.0 - 8.3 g/dL Final  . Albumin 10/07/2013 4.0  3.5 - 5.2 g/dL Final  . Calcium 10/07/2013 9.4  8.4 - 10.5 mg/dL Final  . GFR 10/07/2013 64.76  >60.00 mL/min Final  . Direct LDL 10/07/2013 220.2   Final   Optimal:  <100 mg/dLNear or Above Optimal:  100-129 mg/dLBorderline High:  130-159 mg/dLHigh:  160-189 mg/dLVery High:  >190 mg/dL

## 2013-10-13 ENCOUNTER — Other Ambulatory Visit: Payer: Self-pay | Admitting: *Deleted

## 2013-10-13 MED ORDER — INSULIN GLARGINE 100 UNIT/ML ~~LOC~~ SOLN: 30.0000 [IU] | Freq: Two times a day (BID) | SUBCUTANEOUS | Status: DC

## 2013-10-15 ENCOUNTER — Encounter: Payer: Medicare HMO | Attending: Endocrinology | Admitting: Nutrition

## 2013-10-15 DIAGNOSIS — Z713 Dietary counseling and surveillance: Secondary | ICD-10-CM | POA: Insufficient documentation

## 2013-10-15 DIAGNOSIS — E1165 Type 2 diabetes mellitus with hyperglycemia: Principal | ICD-10-CM

## 2013-10-15 DIAGNOSIS — IMO0001 Reserved for inherently not codable concepts without codable children: Secondary | ICD-10-CM | POA: Insufficient documentation

## 2013-10-15 NOTE — Progress Notes (Signed)
Meals are balanced, according to wife, but not sure of carb/fat content of [place] meals he is eating.  Wife says portion sizes are smaller, except for supper, when having rice.    Insulin: Pt. Reports that he has not been taking his Lantus, because he could not afford it.  He started back taking it this Monday.  He is taking 40u q AM  before  Breakfast.   He reports taking no Novolog acB, 20u acL, and 10u acS.  SBGM: He is testing blood sugars qAM, and  5 times before supper.  The acS readings were all high, in the 300s-400s when he was not taking his lantus insulin.  He has done only one reading acS since Monday and it was 278.    Per Dr. Ronnie Derby order, He was told to take 10u of Novolog acB, 25u acL and 10u acS. Test blood sugars ac and HS for 5 days and I will call him on Monday to record blood sugars.   Wife to make sure husband is taking his insulin and testing his blood sugars.   Written instructions given for above goals, and insulin doses.

## 2013-10-30 ENCOUNTER — Other Ambulatory Visit (INDEPENDENT_AMBULATORY_CARE_PROVIDER_SITE_OTHER): Payer: Medicare HMO

## 2013-10-30 DIAGNOSIS — IMO0001 Reserved for inherently not codable concepts without codable children: Secondary | ICD-10-CM

## 2013-10-30 DIAGNOSIS — E785 Hyperlipidemia, unspecified: Secondary | ICD-10-CM

## 2013-10-30 DIAGNOSIS — E1165 Type 2 diabetes mellitus with hyperglycemia: Secondary | ICD-10-CM

## 2013-10-30 LAB — COMPREHENSIVE METABOLIC PANEL
ALBUMIN: 4.2 g/dL (ref 3.5–5.2)
ALK PHOS: 60 U/L (ref 39–117)
ALT: 21 U/L (ref 0–53)
AST: 21 U/L (ref 0–37)
BILIRUBIN TOTAL: 0.6 mg/dL (ref 0.3–1.2)
BUN: 13 mg/dL (ref 6–23)
CO2: 25 mEq/L (ref 19–32)
Calcium: 9.5 mg/dL (ref 8.4–10.5)
Chloride: 105 mEq/L (ref 96–112)
Creatinine, Ser: 1.3 mg/dL (ref 0.4–1.5)
GFR: 59.44 mL/min — ABNORMAL LOW (ref >60.00)
Glucose, Bld: 129 mg/dL — ABNORMAL HIGH (ref 70–99)
POTASSIUM: 4.6 meq/L (ref 3.5–5.1)
SODIUM: 138 meq/L (ref 135–145)
TOTAL PROTEIN: 7.7 g/dL (ref 6.0–8.3)

## 2013-10-30 LAB — LIPID PANEL
CHOLESTEROL: 144 mg/dL (ref 0–200)
HDL: 43.2 mg/dL (ref >39.00)
LDL CALC: 71 mg/dL (ref 0–99)
Total CHOL/HDL Ratio: 3
Triglycerides: 149 mg/dL (ref 0.0–149.0)
VLDL: 29.8 mg/dL (ref 0.0–40.0)

## 2013-10-30 LAB — URINALYSIS, ROUTINE W REFLEX MICROSCOPIC
Bilirubin Urine: NEGATIVE
HGB URINE DIPSTICK: NEGATIVE
Ketones, ur: NEGATIVE
Leukocytes, UA: NEGATIVE
NITRITE: NEGATIVE
SPECIFIC GRAVITY, URINE: 1.02 (ref 1.000–1.030)
TOTAL PROTEIN, URINE-UPE24: NEGATIVE
URINE GLUCOSE: NEGATIVE
Urobilinogen, UA: 0.2 (ref 0.0–1.0)
pH: 5.5 (ref 5.0–8.0)

## 2013-10-30 LAB — MICROALBUMIN / CREATININE URINE RATIO
Creatinine,U: 136.1 mg/dL
Microalb Creat Ratio: 0.9 mg/g (ref 0.0–30.0)
Microalb, Ur: 1.2 mg/dL (ref 0.0–1.9)

## 2013-10-31 LAB — FRUCTOSAMINE: FRUCTOSAMINE: 326 umol/L — AB (ref <285)

## 2013-11-04 ENCOUNTER — Ambulatory Visit: Payer: Self-pay | Admitting: Endocrinology

## 2013-11-11 ENCOUNTER — Encounter: Payer: Self-pay | Admitting: Endocrinology

## 2013-11-11 ENCOUNTER — Ambulatory Visit (INDEPENDENT_AMBULATORY_CARE_PROVIDER_SITE_OTHER): Payer: Medicare HMO | Admitting: Endocrinology

## 2013-11-11 VITALS — BP 138/74 | HR 67 | Temp 98.2°F | Resp 14 | Ht 66.0 in | Wt 171.6 lb

## 2013-11-11 DIAGNOSIS — I10 Essential (primary) hypertension: Secondary | ICD-10-CM

## 2013-11-11 DIAGNOSIS — E1165 Type 2 diabetes mellitus with hyperglycemia: Principal | ICD-10-CM

## 2013-11-11 DIAGNOSIS — IMO0001 Reserved for inherently not codable concepts without codable children: Secondary | ICD-10-CM

## 2013-11-11 DIAGNOSIS — E7800 Pure hypercholesterolemia, unspecified: Secondary | ICD-10-CM

## 2013-11-11 NOTE — Progress Notes (Signed)
Patient ID: Calvin Perez, male   DOB: [birth year], 73 y.o.   MRN: [id number]   Reason for Appointment: Diabetes follow-up   History of Present Illness  Diagnosis: Type 2 DIABETES MELITUS, since 1984       Past history:  He has been on insulin regimen since about 2004. He has required large doses of insulin insulin persistently but with inadequate control usually Had been relatively intolerant to metformin and did not want to pay for Actos His hemoglobin A1c in the past has usually been about 8-9%  He usually has problems with  postprandial hyperglycemia despite taking large dose of mealtime insulin  RECENT history: His overall control has been persistently poor and A1c was still over 9% in 12/14.  Because of low sugars in the mornings on his last visit his Lantus was changed to the morning and the dose reduced from 50 down to 40 units He has not monitored his postprandial  readings consistently and only some readings after lunch until Friday of last week Most of his readings in the afternoons are high and he does not know why some fasting readings are as high as 300 His wife thinks that he is fairly compliant with his insulin Previously has been relatively noncompliant with followup, insulin regimen and glucose monitoring also  Oral hypoglycemic drugs: None at present        Side effects from medications: ? GI side effects from metformin Insulin regimen: Lantus 40  units am. NOVOLOG 07-26-09    Monitors blood glucose: Once a day.    Glucometer: One Touch.          Blood Glucose readings:  PREMEAL Breakfast Lunch Dinner Bedtime Overall  Glucose range:  84-302    134-238     Mean/median:  164    221    172    POST-MEAL PC Breakfast PC Lunch PC Dinner  Glucose range:   74-288    Mean/median:   186     Hypoglycemia frequency: Minimal, low normal reading of 73 at 4 AM once            Meals: 3 meals per day.     he is generally eating vegetarian meals but his wife says he is getting  an egg for breakfast and some chicken at dinner      Physical activity: exercise: Minimal        Complications: are: Atherosclerotic vascular disease, recent stroke     Lab Results  Component Value Date   HGBA1C 9.1* 09/15/2013   HGBA1C 9.5* 06/19/2013   HGBA1C 10.2* 04/17/2013   Lab Results  Component Value Date   MICROALBUR 1.2 10/30/2013   LDLCALC 71 10/30/2013   CREATININE 1.3 10/30/2013    HYPERTENSION:  he has previously been on multiple drugs including labetalol and Diovan but not clear if he is taking any medications Appears to be only on metoprolol with fairly good blood pressure today No change in renal function     Medication List       This list is accurate as of: 11/11/13  2:02 PM.  Always use your most recent med list.               ALPRAZolam 0.25 MG tablet  Commonly known as:  XANAX  Take 1 tablet (0.25 mg total) by mouth at bedtime.     amiodarone 200 MG tablet  Commonly known as:  PACERONE  Take 1 tablet (200 mg total) by mouth daily.  aspirin 81 MG tablet  Take 81 mg by mouth daily.     aspirin 325 MG tablet  325 mg.     atorvastatin 80 MG tablet  Commonly known as:  LIPITOR  Take 1 tablet (80 mg total) by mouth daily.     calcium carbonate 600 MG Tabs tablet  Commonly known as:  OS-CAL  Take 1,200 mg by mouth daily with breakfast.     colesevelam 625 MG tablet  Commonly known as:  WELCHOL  Take 3 tablets twice a day     feeding supplement (GLUCERNA SHAKE) Liqd  Take 237 mLs by mouth 2 (two) times daily between meals.     ferrous sulfate 325 (65 FE) MG tablet  Take 1 tablet (325 mg total) by mouth daily with breakfast.     glucose blood test strip  Commonly known as:  ONE TOUCH ULTRA TEST  Use as instructed to check blood sugars 3 times per day dx code 250.02     insulin aspart 100 UNIT/ML injection  Commonly known as:  novoLOG  Inject 25 Units into the skin 3 (three) times daily before meals. 10 25 10      insulin glargine 100  UNIT/ML injection  Commonly known as:  LANTUS  Inject 40 Units into the skin daily.     isosorbide mononitrate 30 MG 24 hr tablet  Commonly known as:  IMDUR  Take 1 tablet (30 mg total) by mouth daily.     metoprolol tartrate 12.5 mg Tabs tablet  Commonly known as:  LOPRESSOR  Take 0.5 tablets (12.5 mg total) by mouth 2 (two) times daily.     nitroGLYCERIN 0.4 MG SL tablet  Commonly known as:  NITROSTAT  Place 1 tablet (0.4 mg total) under the tongue every 5 (five) minutes x 3 doses as needed for chest pain.     Ticagrelor 90 MG Tabs tablet  Commonly known as:  BRILINTA  Take 1 tablet (90 mg total) by mouth 2 (two) times daily.     VICKS VAPORUB EX  Apply 1 application topically daily as needed.        Allergies: No Known Allergies  Past Medical History  Diagnosis Date  . Hypertension   . Arthritis   . Dyslipidemia   . Coronary artery disease   . Diabetes mellitus     insulin dependent    Past Surgical History  Procedure Laterality Date  . Open heart surgery  1998  . Coronary artery bypass graft    . Cardiac catheterization      No family history on file.  Social History:  reports that he has never smoked. He has never used smokeless tobacco. He reports that he does not drink alcohol or use illicit drugs.  Review of Systems - Cardiovascular ROS: positive for - coronary artery disease, cerebrovascular disease  HYPERLIPIDEMIA:         The lipid abnormality consists of elevated LDL and this has been generally difficult to control with various drugs in the past probably because of noncompliance with medications and financial reasons.   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   He has had significant anemia previously   Examination:   BP 138/74  Pulse 67  Temp(Src) 98.2 F (36.8 C)  Resp 14  Ht 5\' 6"  (1.676 m)  Wt 171 lb 9.6 oz (77.837 kg)  BMI 27.71 kg/m2  SpO2 98%  Body mass index is 27.71 kg/(m^2).   No pedal edema  Assesment/PLAN:   1. Diabetes type 2, uncontrolled  The patient's diabetes control appears still inadequate with home blood sugar averaging 170 See history of present illness for details of his current management He does appear to need larger doses of mealtime coverage at most of his meals Need better assessment of postprandial readings after breakfast and supper Also since his fasting readings are recently higher will need more basal insulin Discussed blood sugar targets both before and after meals  Recommendations made today:  Start monitoring a few readings after breakfast and lunch  Insulin doses to be as follows:Lantus 46 units am. NOVOLOG 14 AM; 28 LUNCH- 14 at dinner     Encouraged him to start some exercise  Balanced meals with enough protein at each meal   2. HYPERCHOLESTEROLEMIA: He has been on generic Lipitor and lipids are much better now from improve compliance. Liver functions are normal He thinks he is also compliant with his WelChol  Hypertension: Currently on no medications and blood pressure is high normal  Consider restarting low dose Diovan because of diabetes diabetes Need to reassess urine microalbumin  Counseling time over  50% of today's 25 minute visit   Detravion Tester 11/11/2013, 2:02 PM

## 2013-11-11 NOTE — Patient Instructions (Addendum)
Please check blood sugars at least half the time about 2 hours after any meal and as directed on waking up. Please bring blood sugar monitor to each visit  Lantus 46 units am.  NOVOLOG 14 AM; 28 LUNCH-14   DINNER Start walking regularly at least 15-20 minutes daily

## 2013-11-17 ENCOUNTER — Other Ambulatory Visit: Payer: Self-pay | Admitting: *Deleted

## 2013-11-17 MED ORDER — INSULIN GLARGINE 100 UNIT/ML ~~LOC~~ SOLN
40.0000 [IU] | Freq: Every day | SUBCUTANEOUS | Status: DC
Start: 2013-11-17 — End: 2014-07-28

## 2013-11-17 MED ORDER — INSULIN ASPART 100 UNIT/ML ~~LOC~~ SOLN: 25.0000 [IU] | Freq: Three times a day (TID) | SUBCUTANEOUS | Status: DC

## 2013-12-15 ENCOUNTER — Other Ambulatory Visit: Payer: Self-pay | Admitting: *Deleted

## 2013-12-15 ENCOUNTER — Telehealth: Payer: Self-pay | Admitting: Endocrinology

## 2013-12-15 MED ORDER — COLESEVELAM HCL 625 MG PO TABS: ORAL_TABLET | ORAL | Status: AC

## 2013-12-15 NOTE — Telephone Encounter (Signed)
PT needs refill of Rx: Colesevelam 625mg  [facility] [phone] Pt is out of this medication, please refill asap  Thank You :)

## 2013-12-15 NOTE — Telephone Encounter (Signed)
rx sent

## 2013-12-16 ENCOUNTER — Telehealth: Payer: Self-pay | Admitting: Endocrinology

## 2013-12-16 NOTE — Telephone Encounter (Signed)
Pt states that his Rx is too expensive is there anything cheaper?  Please advise   Thank You :)

## 2013-12-17 ENCOUNTER — Telehealth: Payer: Self-pay | Admitting: *Deleted

## 2013-12-17 NOTE — Telephone Encounter (Signed)
Patients wife said the [facility] and novolog is very expensive, she wants to know if there is a cheaper alternative?

## 2013-12-17 NOTE — Telephone Encounter (Signed)
He can switch to Novolin R to same dose of NovoLog but taken 15-30 minutes before eating; stop Welchol

## 2013-12-19 ENCOUNTER — Other Ambulatory Visit: Payer: Self-pay | Admitting: *Deleted

## 2013-12-19 MED ORDER — INSULIN REGULAR HUMAN 100 UNIT/ML IJ SOLN: INTRAMUSCULAR | Status: DC

## 2013-12-19 NOTE — Telephone Encounter (Signed)
Instructions left on patients vm, rx sent

## 2013-12-29 ENCOUNTER — Other Ambulatory Visit: Payer: Self-pay | Admitting: *Deleted

## 2013-12-29 MED ORDER — INSULIN NPH (HUMAN) (ISOPHANE) 100 UNIT/ML ~~LOC~~ SUSP: SUBCUTANEOUS | Status: DC

## 2014-01-12 ENCOUNTER — Other Ambulatory Visit: Payer: Self-pay

## 2014-01-13 ENCOUNTER — Other Ambulatory Visit: Payer: Self-pay

## 2014-01-15 ENCOUNTER — Ambulatory Visit: Payer: Self-pay | Admitting: Endocrinology

## 2014-01-28 ENCOUNTER — Ambulatory Visit (INDEPENDENT_AMBULATORY_CARE_PROVIDER_SITE_OTHER): Payer: Self-pay | Admitting: Endocrinology

## 2014-01-28 ENCOUNTER — Encounter: Payer: Self-pay | Admitting: Endocrinology

## 2014-01-28 VITALS — BP 116/72 | HR 60 | Temp 98.3°F | Resp 14 | Ht 66.0 in | Wt 169.2 lb

## 2014-01-28 DIAGNOSIS — E1165 Type 2 diabetes mellitus with hyperglycemia: Principal | ICD-10-CM

## 2014-01-28 DIAGNOSIS — IMO0001 Reserved for inherently not codable concepts without codable children: Secondary | ICD-10-CM

## 2014-01-28 LAB — URINALYSIS, ROUTINE W REFLEX MICROSCOPIC
BILIRUBIN URINE: NEGATIVE
HGB URINE DIPSTICK: NEGATIVE
KETONES UR: NEGATIVE
LEUKOCYTES UA: NEGATIVE
Nitrite: NEGATIVE
RBC / HPF: NONE SEEN (ref 0–?)
Specific Gravity, Urine: 1.01 (ref 1.000–1.030)
Total Protein, Urine: NEGATIVE
Urine Glucose: 250 — AB
Urobilinogen, UA: 0.2 (ref 0.0–1.0)
WBC, UA: NONE SEEN (ref 0–?)
pH: 6 (ref 5.0–8.0)

## 2014-01-28 LAB — MICROALBUMIN / CREATININE URINE RATIO
CREATININE, U: 45 mg/dL
Microalb Creat Ratio: 0.7 mg/g (ref 0.0–30.0)
Microalb, Ur: 0.3 mg/dL (ref 0.0–1.9)

## 2014-01-28 LAB — COMPREHENSIVE METABOLIC PANEL
ALBUMIN: 4.1 g/dL (ref 3.5–5.2)
ALK PHOS: 61 U/L (ref 39–117)
ALT: 22 U/L (ref 0–53)
AST: 20 U/L (ref 0–37)
BUN: 14 mg/dL (ref 6–23)
CO2: 29 mEq/L (ref 19–32)
Calcium: 9.6 mg/dL (ref 8.4–10.5)
Chloride: 100 mEq/L (ref 96–112)
Creatinine, Ser: 1 mg/dL (ref 0.4–1.5)
GFR: 74.13 mL/min (ref >60.00)
Glucose, Bld: 229 mg/dL — ABNORMAL HIGH (ref 70–99)
POTASSIUM: 4.6 meq/L (ref 3.5–5.1)
Sodium: 136 mEq/L (ref 135–145)
Total Bilirubin: 0.7 mg/dL (ref 0.3–1.2)
Total Protein: 7.6 g/dL (ref 6.0–8.3)

## 2014-01-28 LAB — HEMOGLOBIN A1C: Hgb A1c MFr Bld: 10 % — ABNORMAL HIGH (ref 4.6–6.5)

## 2014-01-28 MED ORDER — INSULIN REGULAR HUMAN 100 UNIT/ML IJ SOLN: INTRAMUSCULAR | Status: DC

## 2014-01-28 NOTE — Progress Notes (Signed)
Patient ID: Calvin Perez, male   DOB: 12-24-1940, 73 y.o.   MRN: [id number]   Reason for Appointment: Diabetes follow-up   History of Present Illness  Diagnosis: Type 2 DIABETES MELITUS, since 1984       Past history:  He has been on insulin regimen since about 2004. He has required large doses of insulin insulin persistently but with inadequate control usually Had been relatively intolerant to metformin and did not want to pay for Actos His hemoglobin A1c in the past has usually been about 8-9%  He usually has problems with  postprandial hyperglycemia despite taking large dose of mealtime insulin  RECENT history: His overall control has been persistently poor and A1c was still over 9% in 12/14.  Some of his compliance is related to his not taking insulin consistently and financial issues In the last few weeks he is taking only Lantus insulin which he was able to obtain from someone who was not using it However he did not take any regular insulin or NovoLog and he thinks this is expensive Still does not understand the need for checking blood sugars regularly and is doing readings only in the morning now Did not call to report persistently high fasting readings Unable to download monitor as he did not bring this today  Oral hypoglycemic drugs: None at present        Side effects from medications: ? GI side effects from metformin Insulin regimen: Lantus 60  units am.   Monitors blood glucose: Once a day.    Glucometer: One Touch.          Blood Glucose readings: Fasting 200-250  Hypoglycemia: None  Meals: 3 meals per day. He is generally eating vegetarian meals but his wife says he is getting an egg for breakfast and some chicken at dinner      Physical activity: exercise: Trying to go regularly at the [facility]        Complications: are: Atherosclerotic vascular disease, recent stroke     Wt Readings from Last 3 Encounters:  01/28/14 169 lb 3.2 oz (76.749 kg)  11/11/13 171 lb 9.6 oz  (77.837 kg)  10/09/13 167 lb (75.751 kg)   Lab Results  Component Value Date   HGBA1C 9.1* 09/15/2013   HGBA1C 9.5* 06/19/2013   HGBA1C 10.2* 04/17/2013   Lab Results  Component Value Date   MICROALBUR 1.2 10/30/2013   LDLCALC 71 10/30/2013   CREATININE 1.3 10/30/2013    HYPERTENSION:  he has previously been on multiple drugs including labetalol and Diovan but not clear if he is taking any medications Appears to be only on metoprolol with fairly good blood pressure today No change in renal function     Medication List       This list is accurate as of: 01/28/14  1:52 PM.  Always use your most recent med list.               ALPRAZolam 0.25 MG tablet  Commonly known as:  XANAX  Take 1 tablet (0.25 mg total) by mouth at bedtime.     amiodarone 200 MG tablet  Commonly known as:  PACERONE  Take 1 tablet (200 mg total) by mouth daily.     aspirin 81 MG tablet  Take 81 mg by mouth daily.     aspirin 325 MG tablet  325 mg.     atorvastatin 80 MG tablet  Commonly known as:  LIPITOR  Take 1 tablet (80 mg total)  by mouth daily.     calcium carbonate 600 MG Tabs tablet  Commonly known as:  OS-CAL  Take 1,200 mg by mouth daily with breakfast.     colesevelam 625 MG tablet  Commonly known as:  WELCHOL  Take 3 tablets twice a day     feeding supplement (GLUCERNA SHAKE) Liqd  Take 237 mLs by mouth 2 (two) times daily between meals.     ferrous sulfate 325 (65 FE) MG tablet  Take 1 tablet (325 mg total) by mouth daily with breakfast.     glucose blood test strip  Commonly known as:  ONE TOUCH ULTRA TEST  Use as instructed to check blood sugars 3 times per day dx code 250.02     insulin aspart 100 UNIT/ML injection  Commonly known as:  novoLOG  Inject 25 Units into the skin 3 (three) times daily before meals. 10 25 10      insulin glargine 100 UNIT/ML injection  Commonly known as:  LANTUS  Inject 0.4 mLs (40 Units total) into the skin daily.     insulin NPH Human 100  UNIT/ML injection  Commonly known as:  HUMULIN N  Inject 40 units daily     insulin regular 100 units/mL injection  Commonly known as:  HUMULIN R  Take 25 units 3 times a day 15-30 minutes before meals     isosorbide mononitrate 30 MG 24 hr tablet  Commonly known as:  IMDUR  Take 1 tablet (30 mg total) by mouth daily.     metoprolol tartrate 12.5 mg Tabs tablet  Commonly known as:  LOPRESSOR  Take 0.5 tablets (12.5 mg total) by mouth 2 (two) times daily.     nitroGLYCERIN 0.4 MG SL tablet  Commonly known as:  NITROSTAT  Place 1 tablet (0.4 mg total) under the tongue every 5 (five) minutes x 3 doses as needed for chest pain.     ticagrelor 90 MG Tabs tablet  Commonly known as:  BRILINTA  Take 1 tablet (90 mg total) by mouth 2 (two) times daily.     VICKS VAPORUB EX  Apply 1 application topically daily as needed.        Allergies: No Known Allergies  Past Medical History  Diagnosis Date  . Hypertension   . Arthritis   . Dyslipidemia   . Coronary artery disease   . Diabetes mellitus     insulin dependent    Past Surgical History  Procedure Laterality Date  . Open heart surgery  1998  . Coronary artery bypass graft    . Cardiac catheterization      No family history on file.  Social History:  reports that he has never smoked. He has never used smokeless tobacco. He reports that he does not drink alcohol or use illicit drugs.  Review of Systems - Cardiovascular ROS: positive for - coronary artery disease, cerebrovascular disease  HYPERLIPIDEMIA:         The lipid abnormality consists of elevated LDL and this has been generally difficult to control with various drugs in the past probably because of noncompliance with medications and financial reasons.   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   He has had significant anemia previously   Examination:   BP 116/72  Pulse 60  Temp(Src) 98.3 F (36.8 C)  Resp 14  Ht 5\' 6"  (1.676 m)  Wt 169 lb 3.2 oz (76.749 kg)  BMI 27.32 kg/m2  SpO2 99%  Body mass index is 27.32 kg/(m^2).   No pedal edema  Assesment/PLAN:   1. Diabetes type 2, uncontrolled  The patient's diabetes control appears still inadequate with home blood sugar averaging 170 See history of present illness for details of his current management He does appear to need larger doses of mealtime coverage at most of his meals Need better assessment of postprandial readings after breakfast and supper Also since his fasting readings are recently higher will need more basal insulin Discussed blood sugar targets both before and after meals  Recommendations made today:  Start monitoring a few readings after breakfast and lunch  Insulin doses to be as follows:Lantus 46 units am. NOVOLOG 14 AM; 28 LUNCH- 14 at dinner     Encouraged him to start some exercise  Balanced meals with enough protein at each meal   2. HYPERCHOLESTEROLEMIA: He has been on generic Lipitor and lipids are much better now from improve compliance. Liver functions are normal He thinks he is also compliant with his WelChol  Hypertension: Currently on no medications and blood pressure is high normal  Consider restarting low dose Diovan because of diabetes diabetes Need to reassess urine microalbumin  Counseling time over  50% of today's 25 minute visit   Elayne Snare 01/28/2014, 1:52 PM      Patient ID: Calvin Perez, male   DOB: Feb 28, 1941, 73 y.o.   MRN: [id number]   Reason for Appointment: Diabetes follow-up   History of  Present Illness  Diagnosis: Type 2 DIABETES MELITUS, since 1984       Past history:  He has been on insulin regimen since about 2004. He has required large doses of insulin insulin persistently but with inadequate control usually Had been relatively intolerant to metformin and did not want to pay for Actos His hemoglobin A1c in the past has usually been about 8-9%  He usually has problems with  postprandial hyperglycemia despite taking large dose of mealtime insulin  RECENT history: His overall control has been persistently poor and A1c was still over 9% in 12/14.  Because of low sugars in the mornings on his last visit his Lantus was changed to the morning and the dose reduced from 50 down to 40 units He has not monitored his postprandial  readings consistently and only some readings after lunch until Friday of last week Most of his readings in the afternoons are high and he does not know why some fasting readings are as high as 300 His wife thinks that he is fairly compliant with his insulin Previously has been relatively noncompliant with followup, insulin regimen and glucose monitoring also  Oral hypoglycemic drugs: None at present        Side effects from medications: ? GI side effects from metformin Insulin regimen: Lantus 40  units am. NOVOLOG 07-26-09    Monitors blood glucose: Once a day.    Glucometer: One Touch.          Blood Glucose readings:  PREMEAL Breakfast Lunch Dinner Bedtime Overall  Glucose range:  84-302    134-238     Mean/median:  164    221    172    POST-MEAL PC Breakfast PC Lunch PC Dinner  Glucose range:   74-288    Mean/median:   186     Hypoglycemia frequency: Minimal, low normal reading of 73 at 4 AM once            Meals: 3 meals per day.     he is generally eating vegetarian meals but his wife says he is getting an egg for breakfast and some chicken at dinner      Physical activity: exercise: Minimal        Complications: are: Atherosclerotic  vascular disease, recent stroke     Lab Results  Component Value Date   HGBA1C 9.1* 09/15/2013   HGBA1C 9.5* 06/19/2013   HGBA1C 10.2* 04/17/2013   Lab Results  Component Value Date   MICROALBUR 1.2 10/30/2013   LDLCALC 71 10/30/2013   CREATININE 1.3 10/30/2013    HYPERTENSION:  he has previously been on multiple drugs including labetalol and Diovan but not clear if he is taking any medications Appears to be only on metoprolol with fairly good blood pressure today No change in renal function     Medication List       This list is accurate as of: 01/28/14  1:53 PM.  Always use your most recent med list.               ALPRAZolam 0.25 MG tablet  Commonly known as:  XANAX  Take 1 tablet (0.25 mg total) by mouth at bedtime.     amiodarone 200 MG tablet  Commonly known as:  PACERONE  Take 1 tablet (200 mg total) by mouth daily.     aspirin 81 MG tablet  Take 81 mg by mouth daily.     aspirin 325 MG tablet  325 mg.     atorvastatin 80 MG tablet  Commonly known as:  LIPITOR  Take 1 tablet (80 mg total) by mouth daily.     calcium carbonate 600 MG Tabs tablet  Commonly known as:  OS-CAL  Take 1,200 mg by mouth daily with breakfast.     colesevelam 625 MG tablet  Commonly known as:  WELCHOL  Take 3 tablets twice a day     feeding supplement (GLUCERNA SHAKE) Liqd  Take 237 mLs by mouth 2 (two) times daily between meals.     ferrous sulfate 325 (65 FE) MG tablet  Take 1 tablet (325 mg total) by mouth daily with breakfast.     glucose blood test strip  Commonly known as:  ONE TOUCH ULTRA TEST  Use as instructed to check blood sugars 3 times per day dx code 250.02     insulin aspart 100 UNIT/ML injection  Commonly known as:  novoLOG  Inject 25 Units into the skin 3 (three) times daily before meals. 10 25 10      insulin glargine 100 UNIT/ML injection  Commonly known as:  LANTUS  Inject 0.4 mLs (40 Units total) into the skin daily.     insulin NPH Human 100 UNIT/ML  injection  Commonly known as:  HUMULIN N  Inject 40 units daily     insulin regular 100 units/mL injection  Commonly known as:  HUMULIN R  Take 25 units 3 times a day 15-30 minutes before meals     isosorbide mononitrate 30 MG 24 hr tablet  Commonly  known as:  IMDUR  Take 1 tablet (30 mg total) by mouth daily.     metoprolol tartrate 12.5 mg Tabs tablet  Commonly known as:  LOPRESSOR  Take 0.5 tablets (12.5 mg total) by mouth 2 (two) times daily.     nitroGLYCERIN 0.4 MG SL tablet  Commonly known as:  NITROSTAT  Place 1 tablet (0.4 mg total) under the tongue every 5 (five) minutes x 3 doses as needed for chest pain.     ticagrelor 90 MG Tabs tablet  Commonly known as:  BRILINTA  Take 1 tablet (90 mg total) by mouth 2 (two) times daily.     VICKS VAPORUB EX  Apply 1 application topically daily as needed.        Allergies: No Known Allergies  Past Medical History  Diagnosis Date  . Hypertension   . Arthritis   . Dyslipidemia   . Coronary artery disease   . Diabetes mellitus     insulin dependent    Past Surgical History  Procedure Laterality Date  . Open heart surgery  1998  . Coronary artery bypass graft    . Cardiac catheterization      No family history on file.  Social History:  reports that he has never smoked. He has never used smokeless tobacco. He reports that he does not drink alcohol or use illicit drugs.  Review of Systems - Cardiovascular ROS: positive for - coronary artery disease, cerebrovascular disease  HYPERLIPIDEMIA:         The lipid abnormality consists of elevated LDL and this has been generally difficult to control with various drugs in the past probably because of noncompliance with medications and financial reasons.   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   He has had significant anemia previously   Examination:   BP 116/72  Pulse 60  Temp(Src) 98.3 F (36.8 C)  Resp 14  Ht 5\' 6"  (1.676 m)  Wt 169 lb 3.2 oz (76.749 kg)  BMI 27.32 kg/m2  SpO2 99%  Body mass index is 27.32 kg/(m^2).   No pedal edema  Assesment/PLAN:   Diabetes type 2, uncontrolled  The patient's diabetes control appears still inadequate with home blood sugar readings over 200 He again is complaining about the cost of various medications and insulin Currently taking only basal insulin and does not understand the need for mealtime coverage, previously has had significant insulin doses at meals Although he is exercising and his weight is stable his glucose control is suboptimal  Recommendations made today:  Start monitoring though readings after breakfast, lunch and dinner and not daily in the morning  Insulin doses to be as follows:Lantus 60 units am. Start regular insulin, 15 at breakfast and supper and 25 at lunch  Bring blood sugar monitor for download on each visit      Encouraged him to continue exercise  Balanced meals with enough protein at each meal   2. HYPERCHOLESTEROLEMIA: He has been on generic Lipitor and lipids have been better  with being compliant.  He can also continue taking WelChol 3 tablets daily  Hypertension: Currently on no medications and blood pressure is high normal Need to reassess urine microalbumin   Elayne Snare 01/28/2014, 1:53 PM

## 2014-01-28 NOTE — Patient Instructions (Addendum)
Start Regular insulin Breakfast and dinner15 Lunch  25 units Take 15-20 minutes before meals

## 2014-04-29 ENCOUNTER — Other Ambulatory Visit: Payer: Self-pay | Admitting: Endocrinology

## 2014-05-06 ENCOUNTER — Encounter: Payer: Self-pay | Admitting: *Deleted

## 2014-05-06 ENCOUNTER — Ambulatory Visit: Payer: Self-pay | Admitting: Endocrinology

## 2014-05-06 DIAGNOSIS — Z0289 Encounter for other administrative examinations: Secondary | ICD-10-CM

## 2014-05-28 ENCOUNTER — Telehealth: Payer: Self-pay | Admitting: *Deleted

## 2014-05-28 ENCOUNTER — Encounter: Payer: Self-pay | Admitting: *Deleted

## 2014-05-28 DIAGNOSIS — IMO0001 Reserved for inherently not codable concepts without codable children: Secondary | ICD-10-CM

## 2014-05-28 DIAGNOSIS — E1165 Type 2 diabetes mellitus with hyperglycemia: Principal | ICD-10-CM

## 2014-05-28 NOTE — Telephone Encounter (Signed)
Left message on machine for patient to call and schedule for diabetes a1c Diabetic bundle

## 2014-06-08 ENCOUNTER — Other Ambulatory Visit: Payer: Self-pay | Admitting: Endocrinology

## 2014-07-28 ENCOUNTER — Ambulatory Visit (INDEPENDENT_AMBULATORY_CARE_PROVIDER_SITE_OTHER): Payer: Medicare HMO | Admitting: Endocrinology

## 2014-07-28 ENCOUNTER — Encounter: Payer: Self-pay | Admitting: Endocrinology

## 2014-07-28 VITALS — BP 118/68 | HR 78 | Temp 98.0°F | Resp 14 | Ht 66.0 in | Wt 173.2 lb

## 2014-07-28 DIAGNOSIS — Z23 Encounter for immunization: Secondary | ICD-10-CM

## 2014-07-28 DIAGNOSIS — E1165 Type 2 diabetes mellitus with hyperglycemia: Secondary | ICD-10-CM

## 2014-07-28 DIAGNOSIS — E7800 Pure hypercholesterolemia, unspecified: Secondary | ICD-10-CM

## 2014-07-28 DIAGNOSIS — IMO0002 Reserved for concepts with insufficient information to code with codable children: Secondary | ICD-10-CM

## 2014-07-28 LAB — LIPID PANEL
CHOLESTEROL: 155 mg/dL (ref 0–200)
HDL: 36.1 mg/dL — ABNORMAL LOW (ref >39.00)
LDL CALC: 88 mg/dL (ref 0–99)
NonHDL: 118.9
TRIGLYCERIDES: 155 mg/dL — AB (ref 0.0–149.0)
Total CHOL/HDL Ratio: 4
VLDL: 31 mg/dL (ref 0.0–40.0)

## 2014-07-28 LAB — BASIC METABOLIC PANEL
BUN: 11 mg/dL (ref 6–23)
CHLORIDE: 106 meq/L (ref 96–112)
CO2: 19 meq/L (ref 19–32)
CREATININE: 1.2 mg/dL (ref 0.4–1.5)
Calcium: 9.6 mg/dL (ref 8.4–10.5)
GFR: 63.99 mL/min (ref >60.00)
GLUCOSE: 123 mg/dL — AB (ref 70–99)
Potassium: 4.4 mEq/L (ref 3.5–5.1)
Sodium: 138 mEq/L (ref 135–145)

## 2014-07-28 LAB — HEMOGLOBIN A1C: Hgb A1c MFr Bld: 8.7 % — ABNORMAL HIGH (ref 4.6–6.5)

## 2014-07-28 MED ORDER — INSULIN LISPRO 100 UNIT/ML (KWIKPEN): PEN_INJECTOR | SUBCUTANEOUS | Status: DC

## 2014-07-28 NOTE — Progress Notes (Signed)
Patient ID: Calvin Perez, male   DOB: 07-15-1941, 73 y.o.   MRN: [id number]   Reason for Appointment: Diabetes follow-up   History of Present Illness  Diagnosis: Type 2 DIABETES MELITUS, since 1984       Past history:  He has been on insulin regimen since about 2004. He has required large doses of insulin insulin persistently but with inadequate control usually Had been relatively intolerant to metformin and did not want to pay for Actos His hemoglobin A1c in the past has usually been about 8-9%  He usually has problems with  postprandial hyperglycemia despite taking large dose of mealtime insulin  RECENT history: His overall control has been persistently poor and A1c is usually over 9% Some of his poor control is related to his not taking insulin consistently and financial issues He has not been seen in follow-up since 12/2013 He is again complaining about the cost of Lantus and NovoLog He is not using an Accu-Chek meter and is doing his readings before breakfast and late afternoon but not after meals Current blood sugar patterns:  His fasting readings are overall high with average about 170  His blood sugar is averaging over 200 in the afternoon which is about 3-4 hours after his lunch  He has only one reading after dinner which was high  He has not had any hypoglycemia and lowest blood sugar was 92 before lunch  He does exercise in the late morning at the [facility] which did not appear to change his blood sugars  Oral hypoglycemic drugs: None at present        Side effects from medications: ? GI side effects from metformin Insulin regimen: Lantus 50  units am. Novolog 20-25-20 before meals  Monitors blood glucose: Once a day.    Glucometer:  Accu-Chek         Blood Glucose readings:   PREMEAL Breakfast Lunch  4-6 PM  Bedtime  average   Glucose range:  86-275  92  103-460   299   195    Hypoglycemia: None  Meals: 3 meals per day. He is generally eating vegetarian meals but  his wife says he is getting an egg for breakfast and some chicken  at lunch      Physical activity: exercise: Trying to go regularly at the [facility] at about 11 AM          Complications: are: Atherosclerotic vascular disease, stroke     Wt Readings from Last 3 Encounters:  07/28/14 173 lb 3.2 oz (78.563 kg)  01/28/14 169 lb 3.2 oz (76.749 kg)  11/11/13 171 lb 9.6 oz (77.837 kg)   Lab Results  Component Value Date   HGBA1C 10.0* 01/28/2014   HGBA1C 9.1* 09/15/2013   HGBA1C 9.5* 06/19/2013   Lab Results  Component Value Date   MICROALBUR 0.3 01/28/2014   LDLCALC 71 10/30/2013   CREATININE 1.0 01/28/2014    HYPERTENSION:  he has previously been on multiple drugs including labetalol and Diovan but not clear if he is taking any medications Appears to be only on metoprolol with fairly good blood pressure today No change in renal function         Medication List       This list is accurate as of: 07/28/14 10:19 AM.  Always use your most recent med list.               ALPRAZolam 0.25 MG tablet  Commonly known as:  Duanne Moron  Take  1 tablet (0.25 mg total) by mouth at bedtime.     amiodarone 200 MG tablet  Commonly known as:  PACERONE  Take 1 tablet (200 mg total) by mouth daily.     aspirin 81 MG tablet  Take 81 mg by mouth daily.     aspirin 325 MG tablet  325 mg.     atorvastatin 80 MG tablet  Commonly known as:  LIPITOR  TAKE 1 TABLET EVERY DAY     calcium carbonate 600 MG Tabs tablet  Commonly known as:  OS-CAL  Take 1,200 mg by mouth daily with breakfast.     colesevelam 625 MG tablet  Commonly known as:  WELCHOL  Take 3 tablets twice a day     feeding supplement (GLUCERNA SHAKE) Liqd  Take 237 mLs by mouth 2 (two) times daily between meals.     ferrous sulfate 325 (65 FE) MG tablet  Take 1 tablet (325 mg total) by mouth daily with breakfast.     glucose blood test strip  Commonly known as:  ONE TOUCH ULTRA TEST  Use as instructed to check blood sugars 3 times  per day dx code 250.02     insulin glargine 100 UNIT/ML injection  Commonly known as:  LANTUS  Inject 50 Units into the skin daily.     insulin regular 100 units/mL injection  Commonly known as:  HUMULIN R  Take 25 units 3 times a day 15-30 minutes before meals     isosorbide mononitrate 30 MG 24 hr tablet  Commonly known as:  IMDUR  Take 1 tablet (30 mg total) by mouth daily.     metoprolol tartrate 12.5 mg Tabs tablet  Commonly known as:  LOPRESSOR  Take 0.5 tablets (12.5 mg total) by mouth 2 (two) times daily.     nitroGLYCERIN 0.4 MG SL tablet  Commonly known as:  NITROSTAT  Place 1 tablet (0.4 mg total) under the tongue every 5 (five) minutes x 3 doses as needed for chest pain.     NOVOLOG 100 UNIT/ML injection  Generic drug:  insulin aspart  INJECT 25 UNITS INTO THE SKIN 3 TIMES DAILY BEFORE MEALS (OR AS DIRECTED 07-26-09)     ticagrelor 90 MG Tabs tablet  Commonly known as:  BRILINTA  Take 1 tablet (90 mg total) by mouth 2 (two) times daily.     VICKS VAPORUB EX  Apply 1 application topically daily as needed.        Allergies: No Known Allergies  Past Medical History  Diagnosis Date  . Hypertension   . Arthritis   . Dyslipidemia   . Coronary artery disease   . Diabetes mellitus     insulin dependent    Past Surgical History  Procedure Laterality Date  . Open heart surgery  1998  . Coronary artery bypass graft    . Cardiac catheterization      No family history on file.  Social History:  reports that he has never smoked. He has never used smokeless tobacco. He reports that he does not drink alcohol or use illicit drugs.  Review of Systems - Cardiovascular ROS: positive for - coronary artery disease, cerebrovascular disease  HYPERLIPIDEMIA:         The lipid abnormality consists of elevated LDL and this has been generally difficult to control with various drugs in the past probably because of noncompliance with medications and financial reasons.    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   He has had significant anemia previously   Examination:   BP 118/68  Pulse 78  Temp(Src) 98 F (36.7 C)  Resp 14  Ht 5\' 6"  (1.676 m)  Wt 173 lb 3.2 oz (78.563 kg)  BMI 27.97 kg/m2  SpO2 96%  Body mass index is 27.97 kg/(m^2).   No pedal edema  Assesment/PLAN:   1. Diabetes type 2, uncontrolled  The patient's diabetes control appears still inadequate with home blood sugar averaging 170 See history of present illness for details of his current management He does appear to need larger doses of mealtime coverage at most of his meals Need better assessment of postprandial readings after breakfast and supper Also since his fasting readings are recently higher will need more basal insulin Discussed blood sugar targets both before and after meals  Recommendations made today:  Start monitoring a few readings after breakfast and lunch  Insulin doses to be as follows:Lantus 46 units am. NOVOLOG 14 AM; 28 LUNCH- 14 at dinner     Encouraged him to start some exercise  Balanced meals with enough protein at each meal   2. HYPERCHOLESTEROLEMIA: He has been on generic Lipitor and lipids are much better now from improve compliance. Liver functions are normal He thinks he is also compliant with his WelChol  Hypertension: Currently on no medications and blood pressure is high normal  Consider restarting low dose Diovan because of diabetes diabetes Need to reassess urine microalbumin  Counseling time over  50%  of today's 25 minute visit   Piercen Covino 07/28/2014, 10:19 AM      Patient ID: Calvin Perez, male   DOB: 1940-11-24, 73 y.o.   MRN: [id number]   Reason for Appointment: Diabetes follow-up   History of Present Illness  Diagnosis: Type 2 DIABETES MELITUS, since 1984       Past history:  He has been on insulin regimen since about 2004. He has required large doses of insulin insulin persistently but with inadequate control usually Had been relatively intolerant to metformin and did not want to pay for Actos His hemoglobin A1c in the past has usually been about 8-9%  He usually has problems with  postprandial hyperglycemia despite taking large dose of mealtime insulin  RECENT history: His overall control has been persistently poor and A1c was still over 9% in 12/14.  Because of low sugars in the mornings on his last visit his Lantus was changed to the morning and the dose reduced from 50 down to 40 units He has not monitored his postprandial  readings consistently and only some readings after lunch until Friday of last week Most of his readings in the afternoons are high and he does not know why some fasting readings are as high as 300 His wife thinks that he is fairly compliant with his insulin Previously has been relatively noncompliant with followup, insulin regimen and glucose monitoring also  Oral hypoglycemic drugs: None at present        Side effects from medications: ?  GI side effects from metformin Insulin regimen: Lantus 40  units am. NOVOLOG 07-26-09    Monitors blood glucose: Once a day.    Glucometer: One Touch.          Blood Glucose readings:  PREMEAL Breakfast Lunch Dinner Bedtime Overall  Glucose range:  84-302    134-238     Mean/median:  164    221    172    POST-MEAL PC Breakfast PC Lunch PC Dinner  Glucose range:   74-288    Mean/median:   186     Hypoglycemia frequency: Minimal, low normal reading of 73 at 4 AM once            Meals: 3 meals per day.      he is generally eating vegetarian meals but his wife says he is getting an egg for breakfast and some chicken at dinner      Physical activity: exercise: Minimal        Complications: are: Atherosclerotic vascular disease, recent stroke     Lab Results  Component Value Date   HGBA1C 10.0* 01/28/2014   HGBA1C 9.1* 09/15/2013   HGBA1C 9.5* 06/19/2013   Lab Results  Component Value Date   MICROALBUR 0.3 01/28/2014   LDLCALC 71 10/30/2013   CREATININE 1.0 01/28/2014    HYPERTENSION:  he has previously been on multiple drugs including labetalol and Diovan but not clear if he is taking any medications Appears to be only on metoprolol with fairly good blood pressure today No change in renal function     Medication List       This list is accurate as of: 07/28/14 10:19 AM.  Always use your most recent med list.               ALPRAZolam 0.25 MG tablet  Commonly known as:  XANAX  Take 1 tablet (0.25 mg total) by mouth at bedtime.     amiodarone 200 MG tablet  Commonly known as:  PACERONE  Take 1 tablet (200 mg total) by mouth daily.     aspirin 81 MG tablet  Take 81 mg by mouth daily.     aspirin 325 MG tablet  325 mg.     atorvastatin 80 MG tablet  Commonly known as:  LIPITOR  TAKE 1 TABLET EVERY DAY     calcium carbonate 600 MG Tabs tablet  Commonly known as:  OS-CAL  Take 1,200 mg by mouth daily with breakfast.     colesevelam 625 MG tablet  Commonly known as:  WELCHOL  Take 3 tablets twice a day     feeding supplement (GLUCERNA SHAKE) Liqd  Take 237 mLs by mouth 2 (two) times daily between meals.     ferrous sulfate 325 (65 FE) MG tablet  Take 1 tablet (325 mg total) by mouth daily with breakfast.     glucose blood test strip  Commonly known as:  ONE TOUCH ULTRA TEST  Use as instructed to check blood sugars 3 times per day dx code 250.02     insulin glargine 100 UNIT/ML injection  Commonly known as:  LANTUS  Inject 50 Units into the skin daily.      insulin regular 100 units/mL injection  Commonly known as:  HUMULIN R  Take 25 units 3 times a day 15-30 minutes before meals     isosorbide mononitrate 30 MG 24 hr tablet  Commonly known as:  IMDUR  Take 1 tablet (30 mg total) by  mouth daily.     metoprolol tartrate 12.5 mg Tabs tablet  Commonly known as:  LOPRESSOR  Take 0.5 tablets (12.5 mg total) by mouth 2 (two) times daily.     nitroGLYCERIN 0.4 MG SL tablet  Commonly known as:  NITROSTAT  Place 1 tablet (0.4 mg total) under the tongue every 5 (five) minutes x 3 doses as needed for chest pain.     NOVOLOG 100 UNIT/ML injection  Generic drug:  insulin aspart  INJECT 25 UNITS INTO THE SKIN 3 TIMES DAILY BEFORE MEALS (OR AS DIRECTED 07-26-09)     ticagrelor 90 MG Tabs tablet  Commonly known as:  BRILINTA  Take 1 tablet (90 mg total) by mouth 2 (two) times daily.     VICKS VAPORUB EX  Apply 1 application topically daily as needed.        Allergies: No Known Allergies  Past Medical History  Diagnosis Date  . Hypertension   . Arthritis   . Dyslipidemia   . Coronary artery disease   . Diabetes mellitus     insulin dependent    Past Surgical History  Procedure Laterality Date  . Open heart surgery  1998  . Coronary artery bypass graft    . Cardiac catheterization      No family history on file.  Social History:  reports that he has never smoked. He has never used smokeless tobacco. He reports that he does not drink alcohol or use illicit drugs.  Review of Systems - Cardiovascular ROS: positive for - coronary artery disease, cerebrovascular disease  HYPERLIPIDEMIA:         The lipid abnormality consists of elevated LDL and this has been generally difficult to control with various drugs in the past probably because of noncompliance with medications and financial reasons.   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   He has had significant anemia previously   Examination:   BP 118/68  Pulse 78  Temp(Src) 98 F (36.7 C)  Resp 14  Ht 5\' 6"  (1.676 m)  Wt 173 lb 3.2 oz (78.563 kg)  BMI 27.97 kg/m2  SpO2 96%  Body mass index is 27.97 kg/(m^2).   No pedal edema  Assesment/PLAN:   Diabetes type 2, uncontrolled  The patient's diabetes control appears still inadequate with home blood sugar readings mostly high especially after meals and overall average of 195 He again is complaining about the cost of various medications and insulin Currently rechecking his blood sugar only before breakfast and late afternoon as discussed in detail in history of present illness Also current problems and management is discussed above  Although he is exercising he does still appear to be needing to be gaining weight   Recommendations made today:  Start monitoring  More readings after breakfast, lunch and dinner, discussed blood sugar targets   Insulin doses to be  as an instructions   He can try Humalog with a free voucher that was given today  Review blood sugars on follow-up and adjust insulin as needed  For now will increase his lunchtime coverage    avoid large amounts of carbohydrates like rice  HYPERCHOLESTEROLEMIA: He needs follow-up levels  Hypertension: Currently on no medications and blood pressure is normal Need to reassess urine microalbumin  Counseling time over 50% of today's 25 minute visit  Patient Instructions  Please check blood sugars at least half the time about 2 hours after any meal and times per week on waking up. Please bring blood sugar monitor to each visit  NOVOLOG 30 AT LUNCH AND 25 AT  [facility] 07/28/2014, 10:19 AM

## 2014-07-28 NOTE — Patient Instructions (Signed)
Please check blood sugars at least half the time about 2 hours after any meal and times per week on waking up. Please bring blood sugar monitor to each visit  NOVOLOG 30 AT [place]

## 2014-09-01 ENCOUNTER — Other Ambulatory Visit: Payer: Self-pay | Admitting: Endocrinology

## 2014-09-10 ENCOUNTER — Encounter (HOSPITAL_COMMUNITY): Payer: Self-pay | Admitting: Cardiology

## 2014-10-07 ENCOUNTER — Other Ambulatory Visit: Payer: Self-pay | Admitting: Endocrinology

## 2014-10-22 ENCOUNTER — Other Ambulatory Visit (INDEPENDENT_AMBULATORY_CARE_PROVIDER_SITE_OTHER): Payer: Medicare HMO

## 2014-10-22 ENCOUNTER — Ambulatory Visit (INDEPENDENT_AMBULATORY_CARE_PROVIDER_SITE_OTHER): Payer: Medicare HMO | Admitting: Endocrinology

## 2014-10-22 ENCOUNTER — Encounter: Payer: Self-pay | Admitting: Endocrinology

## 2014-10-22 VITALS — BP 126/86 | HR 86 | Temp 97.6°F | Ht 66.0 in | Wt 178.0 lb

## 2014-10-22 DIAGNOSIS — E1165 Type 2 diabetes mellitus with hyperglycemia: Secondary | ICD-10-CM

## 2014-10-22 DIAGNOSIS — E7800 Pure hypercholesterolemia, unspecified: Secondary | ICD-10-CM

## 2014-10-22 DIAGNOSIS — IMO0002 Reserved for concepts with insufficient information to code with codable children: Secondary | ICD-10-CM

## 2014-10-22 DIAGNOSIS — I10 Essential (primary) hypertension: Secondary | ICD-10-CM

## 2014-10-22 DIAGNOSIS — D6489 Other specified anemias: Secondary | ICD-10-CM

## 2014-10-22 LAB — COMPREHENSIVE METABOLIC PANEL
ALBUMIN: 4.2 g/dL (ref 3.5–5.2)
ALT: 19 U/L (ref 0–53)
AST: 20 U/L (ref 0–37)
Alkaline Phosphatase: 67 U/L (ref 39–117)
BUN: 13 mg/dL (ref 6–23)
CALCIUM: 10 mg/dL (ref 8.4–10.5)
CHLORIDE: 105 meq/L (ref 96–112)
CO2: 28 mEq/L (ref 19–32)
Creatinine, Ser: 1.19 mg/dL (ref 0.40–1.50)
GFR: 63.33 mL/min (ref >60.00)
Glucose, Bld: 121 mg/dL — ABNORMAL HIGH (ref 70–99)
POTASSIUM: 4.4 meq/L (ref 3.5–5.1)
Sodium: 140 mEq/L (ref 135–145)
Total Bilirubin: 0.6 mg/dL (ref 0.2–1.2)
Total Protein: 7.5 g/dL (ref 6.0–8.3)

## 2014-10-22 LAB — HEMOGLOBIN A1C: Hgb A1c MFr Bld: 9 % — ABNORMAL HIGH (ref 4.6–6.5)

## 2014-10-22 LAB — MICROALBUMIN / CREATININE URINE RATIO
Creatinine,U: 112.4 mg/dL
MICROALB/CREAT RATIO: 1.2 mg/g (ref 0.0–30.0)
Microalb, Ur: 1.4 mg/dL (ref 0.0–1.9)

## 2014-10-22 MED ORDER — VALSARTAN 40 MG PO TABS: 40.0000 mg | ORAL_TABLET | Freq: Every day | ORAL | Status: DC

## 2014-10-22 NOTE — Patient Instructions (Addendum)
Please check blood sugars at least half the time about 2 hours after any meal and times per week on waking up. Please bring blood sugar monitor to each visit   Call name of new insulin today  Start Valsartan for blood pressure

## 2014-10-22 NOTE — Progress Notes (Signed)
Patient ID: Calvin Perez, male   DOB: September 19, 1941, 74 y.o.   MRN: [id number]   Reason for Appointment: Diabetes follow-up   History of Present Illness  Diagnosis: Type 2 DIABETES MELITUS, since 1984       Past history:  He has been on insulin regimen since about 2004. He has required large doses of insulin insulin persistently but with inadequate control usually Had been relatively intolerant to metformin and did not want to pay for Actos His hemoglobin A1c in the past has usually been about 8-9%  He usually has problems with  postprandial hyperglycemia despite taking large dose of mealtime insulin  RECENT history: His overall control has been persistently poor and A1c is usually over 9% Some of his poor control previously was related to his not taking insulin consistently because of financial issues Blood sugars at home are about the same as on the last visit overall Despite repeated instructions he is only checking his blood sugars randomly at breakfast time and about 6 PM No postprandial readings available and A1c is pending On his last visit he was told to take higher doses of NovoLog before lunch and supper and he is doing this Also he thinks that he is taking another kind of insulin from a friend and takes 4-5 units around lunchtime and suppertime and he thinks blood sugars are better.  He does not think that this is in a vial and syringe but he thinks he has been given this before. He still has a little difficulty remembering and not clear if he is consistent with his regimen Current blood sugar patterns and problems:  His fasting readings are overall still high although occasionally below 140 r and about the same as on the last visit  Blood sugars around supper time are quite variable and unclear why  He has no hypoglycemia with lowest glucose 93  Has not checked any readings after his meals  Has been little irregular with his exercise since he does not go to the cold  weather is much to the [facility]  Oral hypoglycemic drugs: None at present        Side effects from medications: ? GI side effects from metformin Insulin regimen: Lantus 50  units am. Novolog 25-30-25 before meal; 5 Units of unknown  bid  Monitors blood glucose:  1.9 times a day.    Glucometer:  Accu-Chek         Blood Glucose readings:   PRE-MEAL Breakfast Lunch  6 PM  Bedtime Overall  Glucose range:  128-206    93-254     Mean/median:      188    Meals: 3 meals per day. Lunch 2 pm, dinner 7-8 PM He is generally eating vegetarian meals but his wife says he is getting an egg for breakfast and some chicken  at lunch      Physical activity: exercise: Trying to go periodically to the [facility]           Complications: are: Atherosclerotic vascular disease, stroke     Wt Readings from Last 3 Encounters:  10/22/14 178 lb (80.74 kg)  07/28/14 173 lb 3.2 oz (78.563 kg)  01/28/14 169 lb 3.2 oz (76.749 kg)   Lab Results  Component Value Date   HGBA1C 9.0* 10/22/2014   HGBA1C 8.7* 07/28/2014   HGBA1C 10.0* 01/28/2014   Lab Results  Component Value Date   MICROALBUR 1.4 10/22/2014   LDLCALC 88 07/28/2014   CREATININE 1.19 10/22/2014  Appointment on 10/22/2014  Component Date Value Ref Range Status  . Sodium 10/22/2014 140  135 - 145 mEq/L Final  . Potassium 10/22/2014 4.4  3.5 - 5.1 mEq/L Final  . Chloride 10/22/2014 105  96 - 112 mEq/L Final  . CO2 10/22/2014 28  19 - 32 mEq/L Final  . Glucose, Bld 10/22/2014 121* 70 - 99 mg/dL Final  . BUN 10/22/2014 13  6 - 23 mg/dL Final  . Creatinine, Ser 10/22/2014 1.19  0.40 - 1.50 mg/dL Final  . Total Bilirubin 10/22/2014 0.6  0.2 - 1.2 mg/dL Final  . Alkaline Phosphatase 10/22/2014 67  39 - 117 U/L Final  . AST 10/22/2014 20  0 - 37 U/L Final  . ALT 10/22/2014 19  0 - 53 U/L Final  . Total Protein 10/22/2014 7.5  6.0 - 8.3 g/dL Final  . Albumin 10/22/2014 4.2  3.5 - 5.2 g/dL Final  . Calcium 10/22/2014 10.0  8.4 - 10.5 mg/dL Final  . GFR  10/22/2014 63.33  >60.00 mL/min Final  . Hgb A1c MFr Bld 10/22/2014 9.0* 4.6 - 6.5 % Final   Glycemic Control Guidelines for People with Diabetes:Non Diabetic:  <6%Goal of Therapy: <7%Additional Action Suggested:  >8%   . Microalb, Ur 10/22/2014 1.4  0.0 - 1.9 mg/dL Final  . Creatinine,U 10/22/2014 112.4   Final  . Microalb Creat Ratio 10/22/2014 1.2  0.0 - 30.0 mg/g Final           Medication List       This list is accurate as of: 10/22/14 11:59 PM.  Always use your most recent med list.               ACCU-CHEK AVIVA PLUS test strip  Generic drug:  glucose blood  USE AS DIRECTED 3 TIMES A DAY *DX :250.02     ALPRAZolam 0.25 MG tablet  Commonly known as:  XANAX  Take 1 tablet (0.25 mg total) by mouth at bedtime.     amiodarone 200 MG tablet  Commonly known as:  PACERONE  Take 1 tablet (200 mg total) by mouth daily.     aspirin 81 MG tablet  Take 81 mg by mouth daily.     aspirin 325 MG tablet  325 mg.     atorvastatin 80 MG tablet  Commonly known as:  LIPITOR  TAKE 1 TABLET EVERY DAY     calcium carbonate 600 MG Tabs tablet  Commonly known as:  OS-CAL  Take 1,200 mg by mouth daily with breakfast.     colesevelam 625 MG tablet  Commonly known as:  WELCHOL  Take 3 tablets twice a day     feeding supplement (GLUCERNA SHAKE) Liqd  Take 237 mLs by mouth 2 (two) times daily between meals.     ferrous sulfate 325 (65 FE) MG tablet  Take 1 tablet (325 mg total) by mouth daily with breakfast.     insulin glargine 100 UNIT/ML injection  Commonly known as:  LANTUS  Inject 50 Units into the skin daily.     insulin lispro 100 UNIT/ML KiwkPen  Commonly known as:  HUMALOG KWIKPEN  25-30 units ac tid     insulin regular 100 units/mL injection  Commonly known as:  HUMULIN R  Take 25 units 3 times a day 15-30 minutes before meals     isosorbide mononitrate 30 MG 24 hr tablet  Commonly known as:  IMDUR  Take 1 tablet (30 mg total) by mouth daily.  metoprolol  tartrate 12.5 mg Tabs tablet  Commonly known as:  LOPRESSOR  Take 0.5 tablets (12.5 mg total) by mouth 2 (two) times daily.     nitroGLYCERIN 0.4 MG SL tablet  Commonly known as:  NITROSTAT  Place 1 tablet (0.4 mg total) under the tongue every 5 (five) minutes x 3 doses as needed for chest pain.     NOVOLOG 100 UNIT/ML injection  Generic drug:  insulin aspart  INJECT 25 UNITS INTO THE SKIN 3 TIMES DAILY BEFORE MEALS (OR AS DIRECTED 07-26-09)     ticagrelor 90 MG Tabs tablet  Commonly known as:  BRILINTA  Take 1 tablet (90 mg total) by mouth 2 (two) times daily.     valsartan 40 MG tablet  Commonly known as:  DIOVAN  Take 1 tablet (40 mg total) by mouth daily.     VICKS VAPORUB EX  Apply 1 application topically daily as needed.        Allergies: No Known Allergies  Past Medical History  Diagnosis Date  . Hypertension   . Arthritis   . Dyslipidemia   . Coronary artery disease   . Diabetes mellitus     insulin dependent    Past Surgical History  Procedure Laterality Date  . Open heart surgery  1998  . Coronary artery bypass graft    . Cardiac catheterization    . Left heart catheterization with coronary angiogram N/A 09/15/2013    Procedure: LEFT HEART CATHETERIZATION WITH CORONARY ANGIOGRAM;  Surgeon: Clent Demark, MD;  Location: [facility] CATH LAB;  Service: Cardiovascular;  Laterality: N/A;  . Percutaneous stent intervention  09/15/2013    Procedure: PERCUTANEOUS STENT INTERVENTION;  Surgeon: Clent Demark, MD;  Location: [facility] CATH LAB;  Service: Cardiovascular;;    No family history on file.  Social History:  reports that he has never smoked. He has never used smokeless tobacco. He reports that he does not drink alcohol or use illicit drugs.  Review of Systems - Cardiovascular ROS: positive for - coronary artery disease, cerebrovascular disease   HYPERTENSION:  he has previously been on multiple drugs including labetalol and Diovan but not clear if he is taking  any medications Appears to be only on metoprolol   No change in renal function   HYPERLIPIDEMIA:         The lipid abnormality consists of elevated LDL and this has been generally difficult to control with various drugs in the past probably because of noncompliance with medications and financial reasons.   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   He has had significant anemia previously   Examination:   BP 126/86 mmHg  Pulse 86  Temp(Src) 97.6 F (36.4 C) (Oral)  Ht 5\' 6"  (1.676 m)  Wt 178 lb (80.74 kg)  BMI 28.74 kg/m2  SpO2 96%  Body mass index is 28.74 kg/(m^2).   No pedal edema  Assesment/PLAN:   1. Diabetes type 2, uncontrolled  The patient's diabetes control appears still  inadequate with home blood sugar averaging 170 See history of present illness for details of his current management He does appear to need larger doses of mealtime coverage at most of his meals Need better assessment of postprandial readings after breakfast and supper Also since his fasting readings are recently higher will need more basal insulin Discussed blood sugar targets both before and after meals  Recommendations made today:  Start monitoring a few readings after breakfast and lunch  Insulin doses to be as follows:Lantus 46 units am. NOVOLOG 14 AM; 28 LUNCH- 14 at dinner     Encouraged him to start some exercise  Balanced meals with enough protein at each meal   2. HYPERCHOLESTEROLEMIA: He has been on generic Lipitor and lipids are much better  now from improve compliance. Liver functions are normal He thinks he is also compliant with his WelChol  Hypertension: Currently on no medications and blood pressure is high normal  Consider restarting low dose Diovan because of diabetes diabetes Need to reassess urine microalbumin  Counseling time over  50% of today's 25 minute visit   Dorris Pierre 10/23/2014, 5:46 PM      Patient ID: Calvin Perez, male   DOB: [birth year], 74 y.o.   MRN: [id number]   Reason for Appointment: Diabetes follow-up   History of Present Illness  Diagnosis: Type 2 DIABETES MELITUS, since 1984       Past history:  He has been on insulin regimen since about 2004. He has required large doses of insulin insulin persistently but with inadequate control usually Had been relatively intolerant to metformin and did not want to pay for Actos His hemoglobin A1c in the past has usually been about 8-9%  He usually has problems with  postprandial hyperglycemia despite taking large dose of mealtime insulin  RECENT history: His overall control has been persistently poor and A1c was still over 9% in 12/14.  Because of low sugars in the mornings on his last visit his Lantus was changed to the morning and the dose reduced from 50 down to 40 units He has not monitored his postprandial  readings consistently and only some readings after lunch until Friday of last week Most of his readings in the afternoons are high and he does not know why some fasting readings are as high as 300 His wife thinks that he is fairly compliant with his insulin Previously has been relatively noncompliant with followup, insulin regimen and glucose monitoring also  Oral hypoglycemic drugs: None at present        Side effects from medications: ? GI side effects from metformin Insulin regimen: Lantus 40  units am. NOVOLOG 07-26-09    Monitors blood glucose: Once a day.    Glucometer: One Touch.          Blood Glucose readings:  PREMEAL  Breakfast Lunch Dinner Bedtime Overall  Glucose range:  84-302    134-238     Mean/median:  164    221    172    POST-MEAL PC Breakfast PC Lunch PC Dinner  Glucose range:   74-288    Mean/median:   186     Hypoglycemia frequency: Minimal, low normal reading of 73 at 4 AM once            Meals: 3 meals per day.     he is generally eating vegetarian meals but his wife says he is getting an egg for breakfast and some chicken at dinner  Physical activity: exercise: Minimal        Complications: are: Atherosclerotic vascular disease, recent stroke     Lab Results  Component Value Date   HGBA1C 9.0* 10/22/2014   HGBA1C 8.7* 07/28/2014   HGBA1C 10.0* 01/28/2014   Lab Results  Component Value Date   MICROALBUR 1.4 10/22/2014   LDLCALC 88 07/28/2014   CREATININE 1.19 10/22/2014    HYPERTENSION:  he has previously been on multiple drugs including labetalol and Diovan but not clear if he is taking any medications Appears to be only on metoprolol with fairly good blood pressure today No change in renal function     Medication List       This list is accurate as of: 10/22/14 11:59 PM.  Always use your most recent med list.               ACCU-CHEK AVIVA PLUS test strip  Generic drug:  glucose blood  USE AS DIRECTED 3 TIMES A DAY *DX :250.02     ALPRAZolam 0.25 MG tablet  Commonly known as:  XANAX  Take 1 tablet (0.25 mg total) by mouth at bedtime.     amiodarone 200 MG tablet  Commonly known as:  PACERONE  Take 1 tablet (200 mg total) by mouth daily.     aspirin 81 MG tablet  Take 81 mg by mouth daily.     aspirin 325 MG tablet  325 mg.     atorvastatin 80 MG tablet  Commonly known as:  LIPITOR  TAKE 1 TABLET EVERY DAY     calcium carbonate 600 MG Tabs tablet  Commonly known as:  OS-CAL  Take 1,200 mg by mouth daily with breakfast.     colesevelam 625 MG tablet  Commonly known as:  WELCHOL  Take 3 tablets twice a day     feeding supplement (GLUCERNA SHAKE)  Liqd  Take 237 mLs by mouth 2 (two) times daily between meals.     ferrous sulfate 325 (65 FE) MG tablet  Take 1 tablet (325 mg total) by mouth daily with breakfast.     insulin glargine 100 UNIT/ML injection  Commonly known as:  LANTUS  Inject 50 Units into the skin daily.     insulin lispro 100 UNIT/ML KiwkPen  Commonly known as:  HUMALOG KWIKPEN  25-30 units ac tid     insulin regular 100 units/mL injection  Commonly known as:  HUMULIN R  Take 25 units 3 times a day 15-30 minutes before meals     isosorbide mononitrate 30 MG 24 hr tablet  Commonly known as:  IMDUR  Take 1 tablet (30 mg total) by mouth daily.     metoprolol tartrate 12.5 mg Tabs tablet  Commonly known as:  LOPRESSOR  Take 0.5 tablets (12.5 mg total) by mouth 2 (two) times daily.     nitroGLYCERIN 0.4 MG SL tablet  Commonly known as:  NITROSTAT  Place 1 tablet (0.4 mg total) under the tongue every 5 (five) minutes x 3 doses as needed for chest pain.     NOVOLOG 100 UNIT/ML injection  Generic drug:  insulin aspart  INJECT 25 UNITS INTO THE SKIN 3 TIMES DAILY BEFORE MEALS (OR AS DIRECTED 07-26-09)     ticagrelor 90 MG Tabs tablet  Commonly known as:  BRILINTA  Take 1 tablet (90 mg total) by mouth 2 (two) times daily.     valsartan 40 MG tablet  Commonly known as:  DIOVAN  Take 1 tablet (40  mg total) by mouth daily.     VICKS VAPORUB EX  Apply 1 application topically daily as needed.        Allergies: No Known Allergies  Past Medical History  Diagnosis Date  . Hypertension   . Arthritis   . Dyslipidemia   . Coronary artery disease   . Diabetes mellitus     insulin dependent    Past Surgical History  Procedure Laterality Date  . Open heart surgery  1998  . Coronary artery bypass graft    . Cardiac catheterization    . Left heart catheterization with coronary angiogram N/A 09/15/2013    Procedure: LEFT HEART CATHETERIZATION WITH CORONARY ANGIOGRAM;  Surgeon: Clent Demark, MD;   Location: [facility] CATH LAB;  Service: Cardiovascular;  Laterality: N/A;  . Percutaneous stent intervention  09/15/2013    Procedure: PERCUTANEOUS STENT INTERVENTION;  Surgeon: Clent Demark, MD;  Location: [place] CATH LAB;  Service: Cardiovascular;;    No family history on file.  Social History:  reports that he has never smoked. He has never used smokeless tobacco. He reports that he does not drink alcohol or use illicit drugs.  Review of Systems - Cardiovascular ROS: positive for - coronary artery disease, cerebrovascular disease  HYPERLIPIDEMIA:         The lipid abnormality consists of elevated LDL and this has been generally difficult to control with various drugs in the past mostly because of noncompliance with medications and financial reasons.   LDL is still not below 70 and he does not take WelChol consistently as directed   Lab Results  Component Value Date   CHOL 155 07/28/2014   HDL 36.10* 07/28/2014   LDLCALC 88 07/28/2014   LDLDIRECT 220.2 10/07/2013   TRIG 155.0* 07/28/2014   CHOLHDL 4 07/28/2014   HYPERTENSION: He is only taking metoprolol, had been on other drugs before prior to his hospitalization Blood pressure is relatively higher at home.  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  Speech is still slightly slurred   He has had significant anemia previously, has not followed up with PCP  Lab Results  Component Value Date   WBC 10.8* 09/18/2013   HGB 10.2* 09/18/2013   HCT 30.7* 09/18/2013   MCV 85.5 09/18/2013   PLT 281 09/18/2013   He is asking about intermittent shoulder pain, reassured him that this is  unlikely to be angina    Examination:   BP 126/86 mmHg  Pulse 86  Temp(Src) 97.6 F (36.4 C) (Oral)  Ht 5\' 6"  (1.676 m)  Wt 178 lb (80.74 kg)  BMI 28.74 kg/m2  SpO2 96%  Body mass index is 28.74 kg/(m^2).   Repeat blood pressure unchanged  No ankle  edema  Assesment/PLAN:   Diabetes type 2, uncontrolled  The patient's diabetes control appears still inadequate with home blood sugar readings mostly high and relatively higher again after meals See history of present illness for details on current management, blood sugar patterns and problems identified Difficult to get him to comply with instructions for his insulin and glucose monitoring He has not been exercising as much and has gained weight again Previously had reported intolerance to metformin He claims that he is taking a new kind of insulin from a friend and not clear this is U-500 insulin which he had tried before and claimed to have side effects from it.  Does not  appear to have any better readings compared to the last visit A1c pending from today  Recommendations made today as in patient instructions:  Needs better pattern of blood sugar monitoring  He will let us know what other insulin he is taking on his own and may consider switching NovoLog to U-500 if that is what he has.  However cost may be a factor  The fasting blood sugars are consistently high will increase Lantus also.  More consistent exercise  Balanced diet with some protein at each meal  HYPERCHOLESTEROLEMIA: He needs follow-up levels but also needs consistent compliance with WelChol  Hypertension: Currently on only metoprolol and blood pressure is relatively higher Need to reassess urine microalbumin He will start him on low-dose valsartan for now and titrate based on blood pressure readings  Counseling time over 50% of today's 25 minute visit  Patient Instructions  Please check blood sugars at least half the time about 2 hours after any meal and times per week on waking up. Please bring blood sugar monitor to each visit   Call name of new insulin today  Start Valsartan for blood pressure    Joon Pohle 10/23/2014, 5:46 PM   Addendum: A1c still high at 9%

## 2014-10-26 ENCOUNTER — Other Ambulatory Visit: Payer: Self-pay | Admitting: *Deleted

## 2014-10-26 ENCOUNTER — Telehealth: Payer: Self-pay | Admitting: Endocrinology

## 2014-10-26 MED ORDER — INSULIN LISPRO 100 UNIT/ML (KWIKPEN): PEN_INJECTOR | SUBCUTANEOUS | Status: DC

## 2014-10-26 NOTE — Telephone Encounter (Signed)
Pt needs novolog flex pen into [facility]

## 2014-10-26 NOTE — Telephone Encounter (Signed)
rx sent

## 2014-10-28 ENCOUNTER — Ambulatory Visit: Payer: Self-pay | Admitting: Endocrinology

## 2014-10-28 ENCOUNTER — Other Ambulatory Visit: Payer: Self-pay | Admitting: Endocrinology

## 2014-10-29 ENCOUNTER — Telehealth: Payer: Self-pay | Admitting: Endocrinology

## 2014-10-29 NOTE — Telephone Encounter (Signed)
rx for Humalog was sent per patients insurance requirement. Daughter is aware

## 2014-10-29 NOTE — Telephone Encounter (Signed)
Daughter calling regarding novolog flex pen script

## 2014-11-02 ENCOUNTER — Other Ambulatory Visit: Payer: Self-pay | Admitting: *Deleted

## 2014-11-02 MED ORDER — INSULIN ASPART 100 UNIT/ML FLEXPEN: PEN_INJECTOR | SUBCUTANEOUS | Status: DC

## 2014-11-02 MED ORDER — INSULIN GLARGINE 100 UNIT/ML ~~LOC~~ SOLN: 50.0000 [IU] | Freq: Every day | SUBCUTANEOUS | Status: DC

## 2014-11-05 ENCOUNTER — Other Ambulatory Visit: Payer: Self-pay | Admitting: *Deleted

## 2014-11-05 MED ORDER — ATORVASTATIN CALCIUM 80 MG PO TABS: 80.0000 mg | ORAL_TABLET | Freq: Every day | ORAL | Status: AC

## 2014-11-17 ENCOUNTER — Telehealth: Payer: Self-pay | Admitting: *Deleted

## 2014-11-17 MED ORDER — INSULIN GLARGINE 100 UNIT/ML ~~LOC~~ SOLN: 46.0000 [IU] | Freq: Every day | SUBCUTANEOUS | Status: DC

## 2014-11-17 MED ORDER — INSULIN LISPRO 100 UNIT/ML (KWIKPEN): PEN_INJECTOR | SUBCUTANEOUS | Status: DC

## 2014-11-17 NOTE — Telephone Encounter (Signed)
We have received a fax for insulin refills for this pt. It is for Humulin 70/30, Humalog kwikpen and Lantus. I am confused by Dr Ronnie Derby last note. Please advise me on insulins and dosages so that I can refill this pt's insulin. Thank you for any help you can give.

## 2014-11-17 NOTE — Telephone Encounter (Signed)
He should be taking Lantus and Humalog or NovoLog, not 70/30, per Dr Ronnie Derby last note:  NOVOLOG 14 AM; 28 lunch; 14 at dinner   Lantus 50 units once a day

## 2014-11-17 NOTE — Telephone Encounter (Signed)
Changes made and rx refill sent to [facility].

## 2014-11-19 ENCOUNTER — Other Ambulatory Visit: Payer: Self-pay | Admitting: Endocrinology

## 2015-01-18 ENCOUNTER — Other Ambulatory Visit (INDEPENDENT_AMBULATORY_CARE_PROVIDER_SITE_OTHER): Payer: Self-pay

## 2015-01-18 DIAGNOSIS — E7800 Pure hypercholesterolemia, unspecified: Secondary | ICD-10-CM

## 2015-01-18 DIAGNOSIS — IMO0002 Reserved for concepts with insufficient information to code with codable children: Secondary | ICD-10-CM

## 2015-01-18 DIAGNOSIS — E1165 Type 2 diabetes mellitus with hyperglycemia: Secondary | ICD-10-CM

## 2015-01-18 DIAGNOSIS — D6489 Other specified anemias: Secondary | ICD-10-CM

## 2015-01-18 LAB — CBC
HCT: 41.5 % (ref 39.0–52.0)
Hemoglobin: 13.6 g/dL (ref 13.0–17.0)
MCHC: 32.9 g/dL (ref 30.0–36.0)
MCV: 81.9 fl (ref 78.0–100.0)
Platelets: 245 10*3/uL (ref 150.0–400.0)
RBC: 5.06 Mil/uL (ref 4.22–5.81)
RDW: 15.4 % (ref 11.5–15.5)
WBC: 5.9 10*3/uL (ref 4.0–10.5)

## 2015-01-18 LAB — BASIC METABOLIC PANEL
BUN: 12 mg/dL (ref 6–23)
CALCIUM: 9.9 mg/dL (ref 8.4–10.5)
CO2: 29 meq/L (ref 19–32)
CREATININE: 1.11 mg/dL (ref 0.40–1.50)
Chloride: 102 mEq/L (ref 96–112)
GFR: 68.58 mL/min (ref >60.00)
GLUCOSE: 214 mg/dL — AB (ref 70–99)
Potassium: 4.8 mEq/L (ref 3.5–5.1)
Sodium: 137 mEq/L (ref 135–145)

## 2015-01-18 LAB — LIPID PANEL
CHOL/HDL RATIO: 4
CHOLESTEROL: 146 mg/dL (ref 0–200)
HDL: 35.7 mg/dL — ABNORMAL LOW (ref >39.00)
LDL CALC: 84 mg/dL (ref 0–99)
NonHDL: 110.3
TRIGLYCERIDES: 131 mg/dL (ref 0.0–149.0)
VLDL: 26.2 mg/dL (ref 0.0–40.0)

## 2015-01-20 ENCOUNTER — Other Ambulatory Visit: Payer: Self-pay | Admitting: Endocrinology

## 2015-01-21 ENCOUNTER — Other Ambulatory Visit: Payer: Self-pay | Admitting: *Deleted

## 2015-01-21 ENCOUNTER — Encounter: Payer: Self-pay | Admitting: Endocrinology

## 2015-01-21 ENCOUNTER — Ambulatory Visit (INDEPENDENT_AMBULATORY_CARE_PROVIDER_SITE_OTHER): Payer: Medicare HMO | Admitting: Endocrinology

## 2015-01-21 VITALS — BP 169/72 | HR 80 | Temp 98.3°F | Resp 14 | Ht 66.0 in | Wt 176.2 lb

## 2015-01-21 DIAGNOSIS — IMO0002 Reserved for concepts with insufficient information to code with codable children: Secondary | ICD-10-CM

## 2015-01-21 DIAGNOSIS — E1165 Type 2 diabetes mellitus with hyperglycemia: Secondary | ICD-10-CM

## 2015-01-21 MED ORDER — INSULIN LISPRO 100 UNIT/ML (KWIKPEN): PEN_INJECTOR | SUBCUTANEOUS | Status: DC

## 2015-01-21 MED ORDER — VALSARTAN 160 MG PO TABS: 160.0000 mg | ORAL_TABLET | Freq: Every day | ORAL | Status: DC

## 2015-01-21 NOTE — Patient Instructions (Signed)
Lantus 46  units am.   Novolog 25 at breakfast; 40 before LUNCH AND 25 before DINNER  Please check blood sugars at least half the time about 2 hours after any meal and 3 times per week on waking up.  Please bring blood sugar monitor to each visit. Recommended blood sugar levels about 2 hours after meal is 140-180 and on waking up 90-130  Walk daily

## 2015-01-21 NOTE — Progress Notes (Signed)
Patient ID: ANTRONE WALLA, male   DOB: [birth year], 74 y.o.   MRN: [id number]   Reason for Appointment: Diabetes follow-up   History of Present Illness  Diagnosis: Type 2 DIABETES MELITUS, since 1984       Past history:  He has been on insulin regimen since about 2004. He has required large doses of insulin insulin persistently but with inadequate control usually Had been relatively intolerant to metformin and did not want to pay for Actos His hemoglobin A1c in the past has usually been about 8-9%  He usually has problems with  postprandial hyperglycemia despite taking large dose of mealtime insulin  RECENT history: His overall control has been persistently poor and A1c is usually around 9% Although he is able to get his insulin consistently recently his blood sugars are still not controlled He is again complaining about the cost of his insulin especially the Lantus While his wife is usually helping him with compliance he is not completely motivated to check his sugars as directed He does not know how to adjust his insulin based on his readings despite being readings over 300 frequently in the afternoon  Current blood sugar patterns and problems:  His fasting readings are variable and in the last 3 days improved; he thinks he had been out of his Lantus for a few days and did not get the prescription because of cost.  Although he thinks he was walking for exercise and improving his blood sugar control in [place] when he was there recently he has not done any walking or exercise recently  He is checking his blood sugars only a couple of hours after his lunch and none after breakfast or supper  Blood sugars after lunch are consistently high  Still very insulin resistant especially around mealtimes  He thinks he is following the instructions for insulin doses but may possibly be reducing the dose at times to conserve his insulin  No hypoglycemia recently  Recent blood sugars are higher  than before, fructosamine not available and he did not come for his A1c as previously scheduled  Oral hypoglycemic drugs: None at present        Side effects from medications: ? GI side effects from metformin Insulin regimen: Lantus 50  units am. Novolog 25-30-25 before meal  Monitors blood glucose:  1.9 times a day.    Glucometer:  Accu-Chek         Blood Glucose readings:   PRE-MEAL Breakfast Lunch  4 PM  Bedtime Overall  Glucose range:  70-268    220-354     Mean/median:      229    Meals: 3 meals per day. Lunch 1-2 pm, dinner 7-8 PM He is generally eating vegetarian meals but his wife says he is getting an egg for breakfast and some chicken  at lunch      Physical activity: exercise: Trying to go periodically to the [facility]           Complications: are: Atherosclerotic vascular disease, stroke     Wt Readings from Last 3 Encounters:  01/21/15 176 lb 3.2 oz (79.924 kg)  10/22/14 178 lb (80.74 kg)  07/28/14 173 lb 3.2 oz (78.563 kg)   Lab Results  Component Value Date   HGBA1C 9.0* 10/22/2014   HGBA1C 8.7* 07/28/2014   HGBA1C 10.0* 01/28/2014   Lab Results  Component Value Date   MICROALBUR 1.4 10/22/2014   LDLCALC 84 01/18/2015   CREATININE 1.11 01/18/2015   No  visits with results within 1 Day(s) from this visit. Latest known visit with results is:  Lab on 01/18/2015  Component Date Value Ref Range Status  . Sodium 01/18/2015 137  135 - 145 mEq/L Final  . Potassium 01/18/2015 4.8  3.5 - 5.1 mEq/L Final  . Chloride 01/18/2015 102  96 - 112 mEq/L Final  . CO2 01/18/2015 29  19 - 32 mEq/L Final  . Glucose, Bld 01/18/2015 214* 70 - 99 mg/dL Final  . BUN 01/18/2015 12  6 - 23 mg/dL Final  . Creatinine, Ser 01/18/2015 1.11  0.40 - 1.50 mg/dL Final  . Calcium 01/18/2015 9.9  8.4 - 10.5 mg/dL Final  . GFR 01/18/2015 68.58  >60.00 mL/min Final  . Cholesterol 01/18/2015 146  0 - 200 mg/dL Final   ATP III Classification       Desirable:  < 200 mg/dL               Borderline  High:  200 - 239 mg/dL          High:  > = 240 mg/dL  . Triglycerides 01/18/2015 131.0  0.0 - 149.0 mg/dL Final   Normal:  <150 mg/dLBorderline High:  150 - 199 mg/dL  . HDL 01/18/2015 35.70* >39.00 mg/dL Final  . VLDL 01/18/2015 26.2  0.0 - 40.0 mg/dL Final  . LDL Cholesterol 01/18/2015 84  0 - 99 mg/dL Final  . Total CHOL/HDL Ratio 01/18/2015 4   Final                  Men          Women1/2 Average Risk     3.4          3.3Average Risk          5.0          4.42X Average Risk          9.6          7.13X Average Risk          15.0          11.0                      . NonHDL 01/18/2015 110.30   Final   NOTE:  Non-HDL goal should be 30 mg/dL higher than patient's LDL goal (i.e. LDL goal of < 70 mg/dL, would have non-HDL goal of < 100 mg/dL)  . WBC 01/18/2015 5.9  4.0 - 10.5 K/uL Final  . RBC 01/18/2015 5.06  4.22 - 5.81 Mil/uL Final  . Platelets 01/18/2015 245.0  150.0 - 400.0 K/uL Final  . Hemoglobin 01/18/2015 13.6  13.0 - 17.0 g/dL Final  . HCT 01/18/2015 41.5  39.0 - 52.0 % Final  . MCV 01/18/2015 81.9  78.0 - 100.0 fl Final  . MCHC 01/18/2015 32.9  30.0 - 36.0 g/dL Final  . RDW 01/18/2015 15.4  11.5 - 15.5 % Final           Medication List       This list is accurate as of: 01/21/15 11:59 PM.  Always use your most recent med list.               ACCU-CHEK AVIVA PLUS test strip  Generic drug:  glucose blood  USE AS DIRECTED 3 TIMES A DAY *DX :250.02     ALPRAZolam 0.25 MG tablet  Commonly known as:  XANAX  Take 1 tablet (0.25 mg total) by  mouth at bedtime.     amiodarone 200 MG tablet  Commonly known as:  PACERONE  Take 1 tablet (200 mg total) by mouth daily.     aspirin 81 MG tablet  Take 81 mg by mouth daily.     aspirin 325 MG tablet  325 mg.     atorvastatin 80 MG tablet  Commonly known as:  LIPITOR  Take 1 tablet (80 mg total) by mouth daily.     calcium carbonate 600 MG Tabs tablet  Commonly known as:  OS-CAL  Take 1,200 mg by mouth daily with  breakfast.     colesevelam 625 MG tablet  Commonly known as:  WELCHOL  Take 3 tablets twice a day     ferrous sulfate 325 (65 FE) MG tablet  Take 1 tablet (325 mg total) by mouth daily with breakfast.     insulin glargine 100 UNIT/ML injection  Commonly known as:  LANTUS  Inject 0.46 mLs (46 Units total) into the skin daily.     LANTUS 100 UNIT/ML injection  Generic drug:  insulin glargine  INJECT 40 UNITS INTO THE SKIN ONCE DAILY AS DIRECTED     insulin lispro 100 UNIT/ML KiwkPen  Commonly known as:  HUMALOG KWIKPEN  Inject 14 units in the am, 40 units with lunch and 14 units with dinner.     insulin regular 100 units/mL injection  Commonly known as:  HUMULIN R  Take 25 units 3 times a day 15-30 minutes before meals     isosorbide mononitrate 30 MG 24 hr tablet  Commonly known as:  IMDUR  Take 1 tablet (30 mg total) by mouth daily.     KLOR-CON 10 10 MEQ tablet  Generic drug:  potassium chloride  Take 10 mEq by mouth daily.     metoprolol tartrate 12.5 mg Tabs tablet  Commonly known as:  LOPRESSOR  Take 0.5 tablets (12.5 mg total) by mouth 2 (two) times daily.     nitroGLYCERIN 0.4 MG SL tablet  Commonly known as:  NITROSTAT  Place 1 tablet (0.4 mg total) under the tongue every 5 (five) minutes x 3 doses as needed for chest pain.     NOVOLOG 100 UNIT/ML injection  Generic drug:  insulin aspart  INJECT 25 UNITS INTO THE SKIN 3 TIMES DAILY BEFORE MEALS (OR AS DIRECTED 07-26-09)     ticagrelor 90 MG Tabs tablet  Commonly known as:  BRILINTA  Take 1 tablet (90 mg total) by mouth 2 (two) times daily.     valsartan 160 MG tablet  Commonly known as:  DIOVAN  Take 1 tablet (160 mg total) by mouth daily.     VICKS VAPORUB EX  Apply 1 application topically daily as needed.        Allergies: No Known Allergies  Past Medical History  Diagnosis Date  . Hypertension   . Arthritis   . Dyslipidemia   . Coronary artery disease   . Diabetes mellitus     insulin  dependent    Past Surgical History  Procedure Laterality Date  . Open heart surgery  1998  . Coronary artery bypass graft    . Cardiac catheterization    . Left heart catheterization with coronary angiogram N/A 09/15/2013    Procedure: LEFT HEART CATHETERIZATION WITH CORONARY ANGIOGRAM;  Surgeon: Clent Demark, MD;  Location: [facility] CATH LAB;  Service: Cardiovascular;  Laterality: N/A;  . Percutaneous stent intervention  09/15/2013    Procedure: PERCUTANEOUS STENT INTERVENTION;  Surgeon: Clent Demark, MD;  Location: [facility] C[facility] LAB;  Service: Cardiovascular;;    No family history on file.  Social History:  reports that he has never smoked. He has never used smokeless tobacco. He reports that he does not drink alcohol or use illicit drugs.  Review of Systems - Cardiovascular ROS: positive for - coronary artery disease, cerebrovascular disease   HYPERTENSION:  he has previously been on multiple drugs including labetalol and Diovan but not clear if he is taking any medications Appears to be only on metoprolol   No change in renal function   HYPERLIPIDEMIA:         The lipid abnormality consists of elevated LDL and this has been generally difficult to control with various drugs in the past probably because of noncompliance with medications and financial reasons.   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   He has had significant anemia previously   Examination:   BP 169/72 mmHg  Pulse 80  Temp(Src) 98.3 F (36.8 C)  Resp 14  Ht 5\' 6"  (1.676  m)  Wt 176 lb 3.2 oz (79.924 kg)  BMI 28.45 kg/m2  SpO2 97%  Body mass index is 28.45 kg/(m^2).   No pedal edema  Assesment/PLAN:   1. Diabetes type 2, uncontrolled  The patient's diabetes control appears still inadequate with home blood sugar averaging 170 See history of present illness for details of his current management He does appear to need larger doses of mealtime coverage at most of his meals Need better assessment of postprandial readings after breakfast and supper Also since his fasting readings are recently higher will need more basal insulin Discussed blood sugar targets both before and after meals  Recommendations made today:  Start monitoring a few readings after breakfast and lunch  Insulin doses to be as follows:Lantus 46 units am. NOVOLOG 14 AM; 28 LUNCH- 14 at dinner     Encouraged him to start some exercise  Balanced meals with enough protein at each meal   2. HYPERCHOLESTEROLEMIA: He has been on generic Lipitor and lipids are much better now from improve compliance. Liver functions are normal He thinks he is also compliant with his WelChol  Hypertension: Currently on no medications and blood pressure is high normal  Consider restarting low dose Diovan because of diabetes diabetes Need to reassess urine microalbumin  Counseling time over  50% of today's 25 minute visit   Alpheus Stiff 01/22/2015, 10:09 AM      Patient ID: Melton Krebs, male   DOB: July 01, 1941, 74 y.o.   MRN: [id number]   Reason for Appointment: Diabetes follow-up   History of Present Illness  Diagnosis: Type 2 DIABETES MELITUS, since 1984       Past history:  He has been on insulin regimen since about 2004. He has required large doses of insulin insulin persistently but with inadequate control usually Had been relatively intolerant to metformin and did not want to pay for Actos His hemoglobin A1c in the  past has usually been about 8-9%  He usually has problems with   postprandial hyperglycemia despite taking large dose of mealtime insulin  RECENT history: His overall control has been persistently poor and A1c was still over 9% in 12/14.  Because of low sugars in the mornings on his last visit his Lantus was changed to the morning and the dose reduced from 50 down to 40 units He has not monitored his postprandial  readings consistently and only some readings after lunch until Friday of last week Most of his readings in the afternoons are high and he does not know why some fasting readings are as high as 300 His wife thinks that he is fairly compliant with his insulin Previously has been relatively noncompliant with followup, insulin regimen and glucose monitoring also  Oral hypoglycemic drugs: None at present        Side effects from medications: ? GI side effects from metformin Insulin regimen: Lantus 40  units am. NOVOLOG 07-26-09    Monitors blood glucose: Once a day.    Glucometer: One Touch.          Blood Glucose readings:  PREMEAL Breakfast Lunch Dinner Bedtime Overall  Glucose range:  84-302    134-238     Mean/median:  164    221    172    POST-MEAL PC Breakfast PC Lunch PC Dinner  Glucose range:   74-288    Mean/median:   186     Hypoglycemia frequency: Minimal, low normal reading of 73 at 4 AM once            Meals: 3 meals per day.     he is generally eating vegetarian meals but his wife says he is getting an egg for breakfast and some chicken at dinner      Physical activity: exercise: Minimal        Complications: are: Atherosclerotic vascular disease, recent stroke     Lab Results  Component Value Date   HGBA1C 9.0* 10/22/2014   HGBA1C 8.7* 07/28/2014   HGBA1C 10.0* 01/28/2014   Lab Results  Component Value Date   MICROALBUR 1.4 10/22/2014   LDLCALC 84 01/18/2015   CREATININE 1.11 01/18/2015    HYPERTENSION:  he has previously been on multiple drugs including labetalol and Diovan but not clear if he is taking any  medications Appears to be only on metoprolol with fairly good blood pressure today No change in renal function     Medication List       This list is accurate as of: 01/21/15 11:59 PM.  Always use your most recent med list.               ACCU-CHEK AVIVA PLUS test strip  Generic drug:  glucose blood  USE AS DIRECTED 3 TIMES A DAY *DX :250.02     ALPRAZolam 0.25 MG tablet  Commonly known as:  XANAX  Take 1 tablet (0.25 mg total) by mouth at bedtime.     amiodarone 200 MG tablet  Commonly known as:  PACERONE  Take 1 tablet (200 mg total) by mouth daily.     aspirin 81 MG tablet  Take 81 mg by mouth daily.     aspirin 325 MG tablet  325 mg.     atorvastatin 80 MG tablet  Commonly known as:  LIPITOR  Take 1 tablet (80 mg total) by mouth daily.     calcium carbonate 600 MG Tabs tablet  Commonly known as:  OS-CAL  Take 1,200 mg by mouth daily with breakfast.     colesevelam 625 MG tablet  Commonly known as:  WELCHOL  Take 3 tablets twice a day     ferrous sulfate 325 (65 FE) MG tablet  Take 1 tablet (325 mg total) by mouth daily with breakfast.     insulin glargine 100 UNIT/ML injection  Commonly known as:  LANTUS  Inject 0.46 mLs (46 Units total) into the skin daily.     LANTUS 100 UNIT/ML injection  Generic drug:  insulin glargine  INJECT 40 UNITS INTO THE SKIN ONCE DAILY AS DIRECTED     insulin lispro 100 UNIT/ML KiwkPen  Commonly known as:  HUMALOG KWIKPEN  Inject 14 units in the am, 40 units with lunch and 14 units with dinner.     insulin regular 100 units/mL injection  Commonly known as:  HUMULIN R  Take 25 units 3 times a day 15-30 minutes before meals     isosorbide mononitrate 30 MG 24 hr tablet  Commonly known as:  IMDUR  Take 1 tablet (30 mg total) by mouth daily.     KLOR-CON 10 10 MEQ tablet  Generic drug:  potassium chloride  Take 10 mEq by mouth daily.     metoprolol tartrate 12.5 mg Tabs tablet  Commonly known as:  LOPRESSOR  Take 0.5  tablets (12.5 mg total) by mouth 2 (two) times daily.     nitroGLYCERIN 0.4 MG SL tablet  Commonly known as:  NITROSTAT  Place 1 tablet (0.4 mg total) under the tongue every 5 (five) minutes x 3 doses as needed for chest pain.     NOVOLOG 100 UNIT/ML injection  Generic drug:  insulin aspart  INJECT 25 UNITS INTO THE SKIN 3 TIMES DAILY BEFORE MEALS (OR AS DIRECTED 07-26-09)     ticagrelor 90 MG Tabs tablet  Commonly known as:  BRILINTA  Take 1 tablet (90 mg total) by mouth 2 (two) times daily.     valsartan 160 MG tablet  Commonly known as:  DIOVAN  Take 1 tablet (160 mg total) by mouth daily.     VICKS VAPORUB EX  Apply 1 application topically daily as needed.        Allergies: No Known Allergies  Past Medical History  Diagnosis Date  . Hypertension   . Arthritis   . Dyslipidemia   . Coronary artery disease   . Diabetes mellitus     insulin dependent    Past Surgical History  Procedure Laterality Date  . Open heart surgery  1998  . Coronary artery bypass graft    . Cardiac catheterization    . Left heart catheterization with coronary angiogram N/A 09/15/2013    Procedure: LEFT HEART CATHETERIZATION WITH CORONARY ANGIOGRAM;  Surgeon: Clent Demark, MD;  Location: [facility] CATH LAB;  Service: Cardiovascular;  Laterality: N/A;  . Percutaneous stent intervention  09/15/2013    Procedure: PERCUTANEOUS STENT INTERVENTION;  Surgeon: Clent Demark, MD;  Location: [facility] CATH LAB;  Service: Cardiovascular;;    No family history on file.  Social History:  reports that he has never smoked. He has never used smokeless tobacco. He reports that he does not drink alcohol or use illicit drugs.  Review of Systems - Cardiovascular ROS: positive for - coronary artery disease, cerebrovascular disease  HYPERLIPIDEMIA:         The lipid abnormality consists of elevated LDL and this has been generally difficult to control with various drugs in  the past mostly because of noncompliance with  medications and financial reasons.   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  Speech is better  He has had significant anemia previously, now normal  Lab Results  Component Value Date   WBC 5.9 01/18/2015   HGB 13.6 01/18/2015   HCT 41.5 01/18/2015   MCV 81.9 01/18/2015   PLT 245.0 01/18/2015   He is complaining about pain in his knees on the backside when walking but not in his calf muscles   Examination:   BP 169/72 mmHg  Pulse 80  Temp(Src) 98.3 F (36.8 C)  Resp 14  Ht 5\' 6"  (1.676 m)  Wt 176 lb 3.2 oz (79.924 kg)  BMI 28.45 kg/m2  SpO2 97%  Body mass index is 28.45 kg/(m^2).   Repeat blood pressure unchanged  No ankle edema  Diabetic foot exam shows normal monofilament sensation in the toes and plantar surfaces, no skin lesions or ulcers on the feet and normal pedal pulses  He has no swelling or tenderness of his knees, mild tenderness of the popliteal fossa on the right   Assesment/PLAN:   Diabetes type 2, uncontrolled  The patient's diabetes control appears very inadequate with home blood sugar readings mostly high and relatively higher after his lunch but not checking after other meals See history of present illness for details on current management, blood sugar patterns and problems identified Even though he is saying that he is compliant with his insulin at mealtimes he is still requiring very large doses of mealtime  coverage Difficult to get him to comply with instructions for his insulin and glucose monitoring, recently ran out of his Lantus for a few days  He has not been exercising as much and has difficulty losing weight  Recommendations made today as in patient instructions:  Needs better compliance with his  blood sugar monitoring, he can do fewer readings in the mornings and more readings after breakfast and supper to help his insulin adjustment  Increase insulin by 10 units at least at lunchtime  Reduce Lantus by 4 units to avoid overnight hypoglycemia  Start walking regularly  Have protein with every meal, discussed sources of protein in a vegetarian diet  Consider U-500 insulin if affordable  HYPERCHOLESTEROLEMIA: He needs consistent compliance with WelChol  Hypertension: Currently on only metoprolol only and blood pressure is poorly controlled  He will start him on 160 milligram valsartan  for now and titrate based on blood pressure readings  Needs short-term follow to make sure he is compliant and adjust his regimen, also will need A1c  Counseling time over 50% of today's 25 minute visit  Patient Instructions  Lantus 46  units am.   Novolog 25 at breakfast; 40 before LUNCH AND 25 before DINNER  Please check blood sugars at least half the time about 2 hours after any meal and 3 times  per week on waking up.  Please bring blood sugar monitor to each visit. Recommended blood sugar levels about 2 hours after meal is 140-180 and on waking up 90-130  Walk daily     Arliss Frisina 01/22/2015, 10:09 AM

## 2015-01-22 LAB — FRUCTOSAMINE: FRUCTOSAMINE: 416 umol/L — AB (ref 190–270)

## 2015-03-04 ENCOUNTER — Telehealth: Payer: Self-pay | Admitting: Endocrinology

## 2015-03-04 ENCOUNTER — Encounter: Payer: Self-pay | Admitting: *Deleted

## 2015-03-04 ENCOUNTER — Ambulatory Visit: Payer: Self-pay | Admitting: Endocrinology

## 2015-03-04 DIAGNOSIS — Z0289 Encounter for other administrative examinations: Secondary | ICD-10-CM

## 2015-03-04 NOTE — Telephone Encounter (Signed)
Letter mailed

## 2015-03-04 NOTE — Telephone Encounter (Signed)
Patient no showed today's appt. Please advise on how to follow up. °A. No follow up necessary. °B. Follow up urgent. Contact patient immediately. °C. Follow up necessary. Contact patient and schedule visit in ___ days. °D. Follow up advised. Contact patient and schedule visit in ____weeks. ° °

## 2015-03-05 ENCOUNTER — Other Ambulatory Visit: Payer: Self-pay | Admitting: Endocrinology

## 2015-03-29 ENCOUNTER — Ambulatory Visit (INDEPENDENT_AMBULATORY_CARE_PROVIDER_SITE_OTHER): Payer: Medicare HMO | Admitting: Endocrinology

## 2015-03-29 ENCOUNTER — Other Ambulatory Visit: Payer: Self-pay

## 2015-03-29 ENCOUNTER — Encounter: Payer: Self-pay | Admitting: Endocrinology

## 2015-03-29 VITALS — BP 120/72 | HR 86 | Temp 98.0°F | Resp 15 | Ht 66.0 in | Wt 175.8 lb

## 2015-03-29 DIAGNOSIS — I10 Essential (primary) hypertension: Secondary | ICD-10-CM

## 2015-03-29 DIAGNOSIS — Z23 Encounter for immunization: Secondary | ICD-10-CM | POA: Diagnosis not present

## 2015-03-29 DIAGNOSIS — E1165 Type 2 diabetes mellitus with hyperglycemia: Secondary | ICD-10-CM | POA: Diagnosis not present

## 2015-03-29 DIAGNOSIS — E7800 Pure hypercholesterolemia, unspecified: Secondary | ICD-10-CM

## 2015-03-29 DIAGNOSIS — IMO0002 Reserved for concepts with insufficient information to code with codable children: Secondary | ICD-10-CM

## 2015-03-29 MED ORDER — METFORMIN HCL ER 500 MG PO TB24: 1500.0000 mg | ORAL_TABLET | Freq: Every day | ORAL | Status: DC

## 2015-03-29 NOTE — Patient Instructions (Addendum)
Lantus 55 units at 9 pm daily  HUMALOG 25 BFST, 40 LUNCH AND 30 DINNER  CHECK AFTER DINNER AT 10 PM  Start taking Metformin 500 mg, 1 tablet with your main meal for 5 days. Occasionally this may initially cause loose stools or nausea. If  tolerating well after 5 days add a second Metformin tablet (500 mg) at the same time.  Continue adding another tablet after 5 days days if no persistent nausea or diarrhea until reaching the maximum tolerated dose or the full dose of 3 tablets once daily.

## 2015-03-29 NOTE — Progress Notes (Signed)
Patient ID: Calvin Perez, male   DOB: June 14, 1941, 74 y.o.   MRN: [id number]   Reason for Appointment: Diabetes follow-up   History of Present Illness  Diagnosis: Type 2 DIABETES MELITUS, since 1984       Past history:  He has been on insulin regimen since about 2004. He has required large doses of insulin insulin persistently but with inadequate control usually Had been relatively intolerant to metformin and did not want to pay for Actos His hemoglobin A1c in the past has usually been about 8-9%  He usually has problems with  postprandial hyperglycemia despite taking large dose of mealtime insulin  RECENT history:  Insulin regimen: Lantus 50  units am.  Humalog 20-40-20 before meal  His overall control has been persistently poor and A1c is usually around 9% He was having mostly postprandial hyperglycemia on his last visit and his lunchtime coverage was increased. He has not come for his labs and not clear what his A1c is recently  Current blood sugar patterns and problems:  His fasting readings are much higher for no apparent reason and averaging over 200.  He thinks he is taking his Lantus consistently in the morning and the same dose as the last time.  However he had previously complaining about the cost of Lantus and not sure if he is taking the right dose  Although he has increased his Novolog at lunchtime he still has occasional high readings at suppertime but these are generally better  Despite reminders he does not check his blood sugars after breakfast or dinner   He now says that he takes extra 4-5 units of Novolog in addition to his Humalog when the blood sugar is high   His wife says that he is always eating rice in the evening although tries to get some protein with most meals including lentils  Oral hypoglycemic drugs: None at present        Side effects from medications: ? GI side effects from metformin  Monitors blood glucose:  1.9 times a day.    Glucometer:   Accu-Chek         Blood Glucose readings:   Mean values apply above for all meters except median for One Touch  PRE-MEAL Fasting Lunch Dinner Bedtime Overall  Glucose range:  190-294    127-293     Mean/median:  230    215    223    Meals: 3 meals per day. Lunch 1-2 pm, dinner 7-8 PM He is generally eating vegetarian meals but his wife says he is getting an egg for breakfast and some chicken  at lunch       Physical activity: exercise: 3-4x/7 at the [facility]           Complications: are: Atherosclerotic vascular disease, stroke     Wt Readings from Last 3 Encounters:  03/29/15 175 lb 12.8 oz (79.742 kg)  01/21/15 176 lb 3.2 oz (79.924 kg)  10/22/14 178 lb (80.74 kg)   Lab Results  Component Value Date   HGBA1C 9.0* 10/22/2014   HGBA1C 8.7* 07/28/2014   HGBA1C 10.0* 01/28/2014   Lab Results  Component Value Date   MICROALBUR 1.4 10/22/2014   LDLCALC 84 01/18/2015   CREATININE 1.11 01/18/2015   No visits with results within 1 Day(s) from this visit. Latest known visit with results is:  Lab on 01/18/2015  Component Date Value Ref Range Status  . Fructosamine 01/18/2015 416* 190 - 270 umol/L Final  .  Sodium 01/18/2015 137  135 - 145 mEq/L Final  . Potassium 01/18/2015 4.8  3.5 - 5.1 mEq/L Final  . Chloride 01/18/2015 102  96 - 112 mEq/L Final  . CO2 01/18/2015 29  19 - 32 mEq/L Final  . Glucose, Bld 01/18/2015 214* 70 - 99 mg/dL Final  . BUN 01/18/2015 12  6 - 23 mg/dL Final  . Creatinine, Ser 01/18/2015 1.11  0.40 - 1.50 mg/dL Final  . Calcium 01/18/2015 9.9  8.4 - 10.5 mg/dL Final  . GFR 01/18/2015 68.58  >60.00 mL/min Final  . Cholesterol 01/18/2015 146  0 - 200 mg/dL Final   ATP III Classification       Desirable:  < 200 mg/dL               Borderline High:  200 - 239 mg/dL          High:  > = 240 mg/dL  . Triglycerides 01/18/2015 131.0  0.0 - 149.0 mg/dL Final   Normal:  <150 mg/dLBorderline High:  150 - 199 mg/dL  . HDL 01/18/2015 35.70* >39.00 mg/dL Final  . VLDL  01/18/2015 26.2  0.0 - 40.0 mg/dL Final  . LDL Cholesterol 01/18/2015 84  0 - 99 mg/dL Final  . Total CHOL/HDL Ratio 01/18/2015 4   Final                  Men          Women1/2 Average Risk     3.4          3.3Average Risk          5.0          4.42X Average Risk          9.6          7.13X Average Risk          15.0          11.0                      . NonHDL 01/18/2015 110.30   Final   NOTE:  Non-HDL goal should be 30 mg/dL higher than patient's LDL goal (i.e. LDL goal of < 70 mg/dL, would have non-HDL goal of < 100 mg/dL)  . WBC 01/18/2015 5.9  4.0 - 10.5 K/uL Final  . RBC 01/18/2015 5.06  4.22 - 5.81 Mil/uL Final  . Platelets 01/18/2015 245.0  150.0 - 400.0 K/uL Final  . Hemoglobin 01/18/2015 13.6  13.0 - 17.0 g/dL Final  . HCT 01/18/2015 41.5  39.0 - 52.0 % Final  . MCV 01/18/2015 81.9  78.0 - 100.0 fl Final  . MCHC 01/18/2015 32.9  30.0 - 36.0 g/dL Final  . RDW 01/18/2015 15.4  11.5 - 15.5 % Final           Medication List       This list is accurate as of: 03/29/15 11:59 PM.  Always use your most recent med list.               ACCU-CHEK AVIVA PLUS test strip  Generic drug:  glucose blood  USE AS DIRECTED 3 TIMES A DAY *DX :250.02     ALPRAZolam 0.25 MG tablet  Commonly known as:  XANAX  Take 1 tablet (0.25 mg total) by mouth at bedtime.     amiodarone 200 MG tablet  Commonly known as:  PACERONE  Take 1 tablet (200 mg total) by mouth daily.  aspirin 81 MG tablet  Take 81 mg by mouth daily.     aspirin 325 MG tablet  325 mg.     atorvastatin 80 MG tablet  Commonly known as:  LIPITOR  Take 1 tablet (80 mg total) by mouth daily.     calcium carbonate 600 MG Tabs tablet  Commonly known as:  OS-CAL  Take 1,200 mg by mouth daily with breakfast.     colesevelam 625 MG tablet  Commonly known as:  WELCHOL  Take 3 tablets twice a day     ferrous sulfate 325 (65 FE) MG tablet  Take 1 tablet (325 mg total) by mouth daily with breakfast.     insulin glargine  100 UNIT/ML injection  Commonly known as:  LANTUS  Inject 0.46 mLs (46 Units total) into the skin daily.     LANTUS 100 UNIT/ML injection  Generic drug:  insulin glargine  INJECT 40 UNITS INTO THE SKIN ONCE DAILY AS DIRECTED     insulin lispro 100 UNIT/ML KiwkPen  Commonly known as:  HUMALOG KWIKPEN  Inject 14 units in the am, 40 units with lunch and 14 units with dinner.     insulin regular 100 units/mL injection  Commonly known as:  HUMULIN R  Take 25 units 3 times a day 15-30 minutes before meals     isosorbide mononitrate 30 MG 24 hr tablet  Commonly known as:  IMDUR  Take 1 tablet (30 mg total) by mouth daily.     KLOR-CON 10 10 MEQ tablet  Generic drug:  potassium chloride  Take 10 mEq by mouth daily.     memantine 10 MG tablet  Commonly known as:  NAMENDA  Take 10 mg by mouth daily.     metFORMIN 500 MG 24 hr tablet  Commonly known as:  GLUCOPHAGE-XR  Take 3 tablets (1,500 mg total) by mouth daily with supper.     metoprolol tartrate 12.5 mg Tabs tablet  Commonly known as:  LOPRESSOR  Take 0.5 tablets (12.5 mg total) by mouth 2 (two) times daily.     nitroGLYCERIN 0.4 MG SL tablet  Commonly known as:  NITROSTAT  Place 1 tablet (0.4 mg total) under the tongue every 5 (five) minutes x 3 doses as needed for chest pain.     ticagrelor 90 MG Tabs tablet  Commonly known as:  BRILINTA  Take 1 tablet (90 mg total) by mouth 2 (two) times daily.     valsartan 160 MG tablet  Commonly known as:  DIOVAN  Take 1 tablet (160 mg total) by mouth daily.     VICKS VAPORUB EX  Apply 1 application topically daily as needed.        Allergies: No Known Allergies  Past Medical History  Diagnosis Date  . Hypertension   . Arthritis   . Dyslipidemia   . Coronary artery disease   . Diabetes mellitus     insulin dependent    Past Surgical History  Procedure Laterality Date  . Open heart surgery  1998  . Coronary artery bypass graft    . Cardiac catheterization    .  Left heart catheterization with coronary angiogram N/A 09/15/2013    Procedure: LEFT HEART CATHETERIZATION WITH CORONARY ANGIOGRAM;  Surgeon: Clent Demark, MD;  Location: [facility] CATH LAB;  Service: Cardiovascular;  Laterality: N/A;  . Percutaneous stent intervention  09/15/2013    Procedure: PERCUTANEOUS STENT INTERVENTION;  Surgeon: Clent Demark, MD;  Location: [place] CATH LAB;  Service:  Cardiovascular;;    No family history on file.  Social History:  reports that he has never smoked. He has never used smokeless tobacco. He reports that he does not drink alcohol or use illicit drugs.  Review of Systems - Cardiovascular ROS: positive for - coronary artery disease, cerebrovascular disease   HYPERTENSION:  he has previously been on multiple drugs including labetalol and Diovan but not clear if he is taking any medications Appears to be only on metoprolol   No change in renal function   HYPERLIPIDEMIA:         The lipid abnormality consists of elevated LDL and this has been generally difficult to control with various drugs in the past probably because of noncompliance with medications and financial reasons.   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   He has had significant anemia previously   Examination:   BP 120/72 mmHg  Pulse 86  Temp(Src) 98 F (36.7 C) (Oral)  Resp 15  Ht 5\' 6"  (1.676 m)  Wt 175 lb 12.8 oz (79.742 kg)  BMI 28.39 kg/m2  SpO2 96%  Body mass index is 28.39 kg/(m^2).   No pedal edema  Assesment/PLAN:   1. Diabetes type  2, uncontrolled  The patient's diabetes control appears still inadequate with home blood sugar averaging 170 See history of present illness for details of his current management He does appear to need larger doses of mealtime coverage at most of his meals Need better assessment of postprandial readings after breakfast and supper Also since his fasting readings are recently higher will need more basal insulin Discussed blood sugar targets both before and after meals  Recommendations made today:  Start monitoring a few readings after breakfast and lunch  Insulin doses to be as follows:Lantus 46 units am. NOVOLOG 14 AM; 28 LUNCH- 14 at dinner     Encouraged him to start some exercise  Balanced meals with enough protein at each meal   2. HYPERCHOLESTEROLEMIA: He has been on generic Lipitor and lipids are much better now from improve compliance. Liver functions are normal He thinks he is also compliant with his WelChol  Hypertension: Currently on no medications and blood pressure is high normal  Consider restarting low dose Diovan because of diabetes diabetes Need to reassess urine microalbumin  Counseling time over  50% of today's 25 minute visit   Castin Donaghue 03/30/2015, 9:20 AM      Patient ID: Calvin Perez, male   DOB: July 21, 1941, 73 y.o.   MRN: [id number]   Reason for Appointment: Diabetes follow-up   History of Present Illness  Diagnosis: Type 2 DIABETES MELITUS, since 1984       Past history:  He has been on insulin regimen since about 2004. He has required large doses of insulin insulin persistently but with inadequate control usually Had been relatively intolerant to metformin and did not want to pay for Actos His hemoglobin A1c in the past has usually been about 8-9%  He usually has problems  with  postprandial hyperglycemia despite taking large dose of mealtime insulin  RECENT history: His overall control has been persistently poor and A1c was still over 9% in  12/14.  Because of low sugars in the mornings on his last visit his Lantus was changed to the morning and the dose reduced from 50 down to 40 units He has not monitored his postprandial  readings consistently and only some readings after lunch until Friday of last week Most of his readings in the afternoons are high and he does not know why some fasting readings are as high as 300 His wife thinks that he is fairly compliant with his insulin Previously has been relatively noncompliant with followup, insulin regimen and glucose monitoring also  Oral hypoglycemic drugs: None at present        Side effects from medications: ? GI side effects from metformin Insulin regimen: Lantus 40  units am. NOVOLOG 07-26-09    Monitors blood glucose: Once a day.    Glucometer: One Touch.          Blood Glucose readings:  PREMEAL Breakfast Lunch Dinner Bedtime Overall  Glucose range:  84-302    134-238     Mean/median:  164    221    172    POST-MEAL PC Breakfast PC Lunch PC Dinner  Glucose range:   74-288    Mean/median:   186     Hypoglycemia frequency: Minimal, low normal reading of 73 at 4 AM once            Meals: 3 meals per day.     he is generally eating vegetarian meals but his wife says he is getting an egg for breakfast and some chicken at dinner      Physical activity: exercise: Minimal        Complications: are: Atherosclerotic vascular disease, recent stroke     Lab Results  Component Value Date   HGBA1C 9.0* 10/22/2014   HGBA1C 8.7* 07/28/2014   HGBA1C 10.0* 01/28/2014   Lab Results  Component Value Date   MICROALBUR 1.4 10/22/2014   LDLCALC 84 01/18/2015   CREATININE 1.11 01/18/2015    HYPERTENSION:  he has previously been on multiple drugs including labetalol and Diovan but not clear if he is taking any medications Appears to be only on metoprolol with fairly good blood pressure today No change in renal function     Medication List       This list is accurate as of:  03/29/15 11:59 PM.  Always use your most recent med list.               ACCU-CHEK AVIVA PLUS test strip  Generic drug:  glucose blood  USE AS DIRECTED 3 TIMES A DAY *DX :250.02     ALPRAZolam 0.25 MG tablet  Commonly known as:  XANAX  Take 1 tablet (0.25 mg total) by mouth at bedtime.     amiodarone 200 MG tablet  Commonly known as:  PACERONE  Take 1 tablet (200 mg total) by mouth daily.     aspirin 81 MG tablet  Take 81 mg by mouth daily.     aspirin 325 MG tablet  325 mg.     atorvastatin 80 MG tablet  Commonly known as:  LIPITOR  Take 1 tablet (80 mg total) by mouth daily.     calcium carbonate 600 MG Tabs tablet  Commonly known as:  OS-CAL  Take 1,200 mg by mouth daily with breakfast.  colesevelam 625 MG tablet  Commonly known as:  WELCHOL  Take 3 tablets twice a day     ferrous sulfate 325 (65 FE) MG tablet  Take 1 tablet (325 mg total) by mouth daily with breakfast.     insulin glargine 100 UNIT/ML injection  Commonly known as:  LANTUS  Inject 0.46 mLs (46 Units total) into the skin daily.     LANTUS 100 UNIT/ML injection  Generic drug:  insulin glargine  INJECT 40 UNITS INTO THE SKIN ONCE DAILY AS DIRECTED     insulin lispro 100 UNIT/ML KiwkPen  Commonly known as:  HUMALOG KWIKPEN  Inject 14 units in the am, 40 units with lunch and 14 units with dinner.     insulin regular 100 units/mL injection  Commonly known as:  HUMULIN R  Take 25 units 3 times a day 15-30 minutes before meals     isosorbide mononitrate 30 MG 24 hr tablet  Commonly known as:  IMDUR  Take 1 tablet (30 mg total) by mouth daily.     KLOR-CON 10 10 MEQ tablet  Generic drug:  potassium chloride  Take 10 mEq by mouth daily.     memantine 10 MG tablet  Commonly known as:  NAMENDA  Take 10 mg by mouth daily.     metFORMIN 500 MG 24 hr tablet  Commonly known as:  GLUCOPHAGE-XR  Take 3 tablets (1,500 mg total) by mouth daily with supper.     metoprolol tartrate 12.5 mg Tabs  tablet  Commonly known as:  LOPRESSOR  Take 0.5 tablets (12.5 mg total) by mouth 2 (two) times daily.     nitroGLYCERIN 0.4 MG SL tablet  Commonly known as:  NITROSTAT  Place 1 tablet (0.4 mg total) under the tongue every 5 (five) minutes x 3 doses as needed for chest pain.     ticagrelor 90 MG Tabs tablet  Commonly known as:  BRILINTA  Take 1 tablet (90 mg total) by mouth 2 (two) times daily.     valsartan 160 MG tablet  Commonly known as:  DIOVAN  Take 1 tablet (160 mg total) by mouth daily.     VICKS VAPORUB EX  Apply 1 application topically daily as needed.        Allergies: No Known Allergies  Past Medical History  Diagnosis Date  . Hypertension   . Arthritis   . Dyslipidemia   . Coronary artery disease   . Diabetes mellitus     insulin dependent    Past Surgical History  Procedure Laterality Date  . Open heart surgery  1998  . Coronary artery bypass graft    . Cardiac catheterization    . Left heart catheterization with coronary angiogram N/A 09/15/2013    Procedure: LEFT HEART CATHETERIZATION WITH CORONARY ANGIOGRAM;  Surgeon: Clent Demark, MD;  Location: [facility] CATH LAB;  Service: Cardiovascular;  Laterality: N/A;  . Percutaneous stent intervention  09/15/2013    Procedure: PERCUTANEOUS STENT INTERVENTION;  Surgeon: Clent Demark, MD;  Location: [place] CATH LAB;  Service: Cardiovascular;;    No family history on file.  Social History:  reports that he has never smoked. He has never used smokeless tobacco. He reports that he does not drink alcohol or use illicit drugs.  Review of Systems - Cardiovascular ROS: positive for - coronary artery disease, cerebrovascular disease  HYPERLIPIDEMIA:         The lipid abnormality consists of elevated LDL and this has been generally difficult to  control with various drugs in the past mostly because of noncompliance with medications and financial reasons.   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  Speech is normal  He has had significant anemia previously, now normal  Lab Results  Component Value Date   WBC 5.9 01/18/2015   HGB 13.6 01/18/2015   HCT 41.5 01/18/2015   MCV 81.9 01/18/2015   PLT 245.0 01/18/2015   Diabetic foot exam was normal in 01/2015    Examination:   BP 120/72 mmHg  Pulse 86  Temp(Src) 98 F (36.7 C) (Oral)  Resp 15  Ht 5\' 6"  (1.676 m)  Wt 175 lb 12.8 oz (79.742 kg)  BMI 28.39 kg/m2  SpO2 96%  Body mass index is 28.39 kg/(m^2).    No ankle edema   Assesment/PLAN:   Diabetes type 2, uncontrolled  The patient's diabetes control is again very poor and his blood sugars have gone up in the mornings now without any apparent region See history of present illness for detailed discussion of his current management, blood sugar patterns and problems identified He may well be taking less Lantus than prescribed as previously discussed fasting blood sugars have not been a problem Also he may benefit from taking U-500 insulin instead of large doses of Humalog, discussed the differences between this and other insulins  Recommendations made today as in patient instructions:  Needs to increase his mealtime coverage at breakfast and supper especially at suppertime and will use 30 units empirically and this may improve his fasting readings also  Try taking Lantus in the evening and go up to 55 minutes  More consistent monitoring after meals as discussed  Have protein with every meal, discussed sources of protein in a vegetarian  diet  Consider U-500 insulin if affordable, he will check on the price  HYPERCHOLESTEROLEMIA: He needs follow-up levels on the next visit  Hypertension: Currently medically controlled with adding valsartan  Preventive care: He has not had Prevnar and his Pneumovax was 10 years ago, discussed need for giving him the Prevnar  Counseling time on subjects discussed above is over 50% of today's 25 minute visit   Patient Instructions  Lantus 55 units at 9 pm daily  HUMALOG 25 BFST, 40 LUNCH AND 30 DINNER  CHECK AFTER DINNER AT 10 PM  Start taking Metformin 500 mg, 1 tablet with your main meal for 5 days. Occasionally this may initially cause loose stools or nausea. If  tolerating well after 5 days add a second Metformin tablet (500 mg) at the same time.  Continue adding another tablet after 5 days days if no persistent nausea or diarrhea until reaching the maximum tolerated dose or the full dose of 3 tablets once daily.    Torrance Stockley 03/30/2015, 9:20 AM

## 2015-03-30 LAB — COMPREHENSIVE METABOLIC PANEL
ALT: 23 U/L (ref 0–53)
AST: 22 U/L (ref 0–37)
Albumin: 4.4 g/dL (ref 3.5–5.2)
Alkaline Phosphatase: 66 U/L (ref 39–117)
BUN: 19 mg/dL (ref 6–23)
CHLORIDE: 98 meq/L (ref 96–112)
CO2: 27 meq/L (ref 19–32)
Calcium: 9.8 mg/dL (ref 8.4–10.5)
Creatinine, Ser: 1.22 mg/dL (ref 0.40–1.50)
GFR: 61.46 mL/min (ref >60.00)
GLUCOSE: 359 mg/dL — AB (ref 70–99)
Potassium: 4.6 mEq/L (ref 3.5–5.1)
SODIUM: 134 meq/L — AB (ref 135–145)
Total Bilirubin: 0.5 mg/dL (ref 0.2–1.2)
Total Protein: 8.1 g/dL (ref 6.0–8.3)

## 2015-03-30 LAB — HEMOGLOBIN A1C: Hgb A1c MFr Bld: 9.7 % — ABNORMAL HIGH (ref 4.6–6.5)

## 2015-05-13 ENCOUNTER — Ambulatory Visit (INDEPENDENT_AMBULATORY_CARE_PROVIDER_SITE_OTHER): Payer: Medicare HMO | Admitting: Endocrinology

## 2015-05-13 ENCOUNTER — Encounter: Payer: Self-pay | Admitting: Endocrinology

## 2015-05-13 VITALS — BP 118/72 | HR 77 | Temp 98.3°F | Resp 16 | Ht 68.0 in | Wt 172.4 lb

## 2015-05-13 DIAGNOSIS — IMO0002 Reserved for concepts with insufficient information to code with codable children: Secondary | ICD-10-CM

## 2015-05-13 DIAGNOSIS — E7800 Pure hypercholesterolemia, unspecified: Secondary | ICD-10-CM

## 2015-05-13 DIAGNOSIS — I10 Essential (primary) hypertension: Secondary | ICD-10-CM | POA: Diagnosis not present

## 2015-05-13 DIAGNOSIS — E1165 Type 2 diabetes mellitus with hyperglycemia: Secondary | ICD-10-CM | POA: Diagnosis not present

## 2015-05-13 DIAGNOSIS — H9192 Unspecified hearing loss, left ear: Secondary | ICD-10-CM

## 2015-05-13 NOTE — Patient Instructions (Addendum)
Humalog 35 at lunch Stay on 45 Lantus  Check blood sugars on waking up .Marland Kitchen3-4  .Marland Kitchen times a week Also check blood sugars about 2 hours after a meal and do this after different meals by rotation Recommended blood sugar levels on waking up is 90-130 and about 2 hours after meal is 140-180 Please bring blood sugar monitor to each visit.

## 2015-05-13 NOTE — Progress Notes (Signed)
Patient ID: Calvin Perez, male   DOB: [birth year], 74 y.o.   MRN: [id number]   Reason for Appointment: Diabetes follow-up   History of Present Illness  Diagnosis: Type 2 DIABETES MELITUS, since 1984       Past history:  He has been on insulin regimen since about 2004. He has required large doses of insulin insulin persistently but with inadequate control usually Had been relatively intolerant to metformin and did not want to pay for Actos His hemoglobin A1c in the past has usually been about 8-9%  He usually has problems with  postprandial hyperglycemia despite taking large dose of mealtime insulin  RECENT history:  Insulin regimen: Lantus 45  units pm.  Humalog 20-40-20 before meal  His overall control has been persistently poor and A1c is usually around 9% No recent A1c available and his glucose on his last visit was 359 He now is taking Nesina using samples from his PCP but not clear how long he has been taking it, may be a few weeks  Current blood sugar patterns and problems:  His fasting readings are gradually coming down and have been mostly near normal in the last week or so  even though he has taken 10 units less of the Lantus in the evening he has compared to his instructed dose.   His recent  overall average 139, on his last visit he had an average of 223  However he is checking blood sugars mostly before breakfast and some time in the afternoon 2-3 hours after lunch  On his last visit he was advised to watch his diet a little better with reduced carbohydrate; his wife is not present today in his office visit  His blood sugar recently is low normal at times before supper time or in the afternoon but only 1 reading below 70   Despite reminders he does not check his blood sugars after breakfast or dinner   He has also lost a little weight  He is trying to be as active as possible at the [facility]  His daughter is not going to help him with his home monitoring  and management  Oral hypoglycemic drugs: Nesina       Side effects from medications: ? GI side effects from metformin  Monitors blood glucose:  1.9 times a day.    Glucometer:  Accu-Chek         Blood Glucose readings:   Mean values apply above for all meters except median for One Touch  PRE-MEAL Fasting Lunch  4-6 PM   7 PM  Overall  Glucose range:  120-248    67-276   234   67-306   Mean/median:  175    150    171     Meals: 3 meals per day. Lunch 1-2 pm, dinner 7-8 PM He is generally eating vegetarian meals but his wife says he is getting an egg for breakfast and some chicken  at lunch       Physical activity: exercise: 3-4x/7 at the [facility]           Complications: are: Atherosclerotic vascular disease, stroke     Wt Readings from Last 3 Encounters:  05/13/15 172 lb 6.4 oz (78.2 kg)  03/29/15 175 lb 12.8 oz (79.742 kg)  01/21/15 176 lb 3.2 oz (79.924 kg)   Lab Results  Component Value Date   HGBA1C 9.7* 03/29/2015   HGBA1C 9.0* 10/22/2014   HGBA1C 8.7* 07/28/2014   Lab  Results  Component Value Date   MICROALBUR 1.4 10/22/2014   LDLCALC 84 01/18/2015   CREATININE 1.22 03/29/2015   No visits with results within 1 Day(s) from this visit. Latest known visit with results is:  Office Visit on 03/29/2015  Component Date Value Ref Range Status  . Hgb A1c MFr Bld 03/29/2015 9.7* 4.6 - 6.5 % Final   Glycemic Control Guidelines for People with Diabetes:Non Diabetic:  <6%Goal of Therapy: <7%Additional Action Suggested:  >8%   . Sodium 03/29/2015 134* 135 - 145 mEq/L Final  . Potassium 03/29/2015 4.6  3.5 - 5.1 mEq/L Final  . Chloride 03/29/2015 98  96 - 112 mEq/L Final  . CO2 03/29/2015 27  19 - 32 mEq/L Final  . Glucose, Bld 03/29/2015 359* 70 - 99 mg/dL Final  . BUN 03/29/2015 19  6 - 23 mg/dL Final  . Creatinine, Ser 03/29/2015 1.22  0.40 - 1.50 mg/dL Final  . Total Bilirubin 03/29/2015 0.5  0.2 - 1.2 mg/dL Final  . Alkaline Phosphatase 03/29/2015 66  39 - 117 U/L Final    . AST 03/29/2015 22  0 - 37 U/L Final  . ALT 03/29/2015 23  0 - 53 U/L Final  . Total Protein 03/29/2015 8.1  6.0 - 8.3 g/dL Final  . Albumin 03/29/2015 4.4  3.5 - 5.2 g/dL Final  . Calcium 03/29/2015 9.8  8.4 - 10.5 mg/dL Final  . GFR 03/29/2015 61.46  >60.00 mL/min Final           Medication List       This list is accurate as of: 05/13/15 11:59 PM.  Always use your most recent med list.               ACCU-CHEK AVIVA PLUS test strip  Generic drug:  glucose blood  USE AS DIRECTED 3 TIMES A DAY *DX :250.02     ALPRAZolam 0.25 MG tablet  Commonly known as:  XANAX  Take 1 tablet (0.25 mg total) by mouth at bedtime.     amiodarone 200 MG tablet  Commonly known as:  PACERONE  Take 1 tablet (200 mg total) by mouth daily.     aspirin 81 MG tablet  Take 81 mg by mouth daily.     aspirin 325 MG tablet  325 mg.     atorvastatin 80 MG tablet  Commonly known as:  LIPITOR  Take 1 tablet (80 mg total) by mouth daily.     calcium carbonate 600 MG Tabs tablet  Commonly known as:  OS-CAL  Take 1,200 mg by mouth daily with breakfast.     colesevelam 625 MG tablet  Commonly known as:  WELCHOL  Take 3 tablets twice a day     ferrous sulfate 325 (65 FE) MG tablet  Take 1 tablet (325 mg total) by mouth daily with breakfast.     insulin glargine 100 UNIT/ML injection  Commonly known as:  LANTUS  Inject 0.46 mLs (46 Units total) into the skin daily.     insulin lispro 100 UNIT/ML KiwkPen  Commonly known as:  HUMALOG KWIKPEN  Inject 14 units in the am, 40 units with lunch and 14 units with dinner.     insulin regular 100 units/mL injection  Commonly known as:  HUMULIN R  Take 25 units 3 times a day 15-30 minutes before meals     isosorbide mononitrate 30 MG 24 hr tablet  Commonly known as:  IMDUR  Take 1 tablet (30 mg total) by  mouth daily.     KLOR-CON 10 10 MEQ tablet  Generic drug:  potassium chloride  Take 10 mEq by mouth daily.     memantine 10 MG tablet   Commonly known as:  NAMENDA  Take 10 mg by mouth daily.     metFORMIN 500 MG 24 hr tablet  Commonly known as:  GLUCOPHAGE-XR  Take 3 tablets (1,500 mg total) by mouth daily with supper.     metoprolol tartrate 12.5 mg Tabs tablet  Commonly known as:  LOPRESSOR  Take 0.5 tablets (12.5 mg total) by mouth 2 (two) times daily.     NESINA 25 MG Tabs  Generic drug:  Alogliptin Benzoate  Take 25 mg by mouth.     nitroGLYCERIN 0.4 MG SL tablet  Commonly known as:  NITROSTAT  Place 1 tablet (0.4 mg total) under the tongue every 5 (five) minutes x 3 doses as needed for chest pain.     ticagrelor 90 MG Tabs tablet  Commonly known as:  BRILINTA  Take 1 tablet (90 mg total) by mouth 2 (two) times daily.     valsartan 160 MG tablet  Commonly known as:  DIOVAN  Take 1 tablet (160 mg total) by mouth daily.     VICKS VAPORUB EX  Apply 1 application topically daily as needed.        Allergies: No Known Allergies  Past Medical History  Diagnosis Date  . Hypertension   . Arthritis   . Dyslipidemia   . Coronary artery disease   . Diabetes mellitus     insulin dependent    Past Surgical History  Procedure Laterality Date  . Open heart surgery  1998  . Coronary artery bypass graft    . Cardiac catheterization    . Left heart catheterization with coronary angiogram N/A 09/15/2013    Procedure: LEFT HEART CATHETERIZATION WITH CORONARY ANGIOGRAM;  Surgeon: Clent Demark, MD;  Location: [facility] CATH LAB;  Service: Cardiovascular;  Laterality: N/A;  . Percutaneous stent intervention  09/15/2013    Procedure: PERCUTANEOUS STENT INTERVENTION;  Surgeon: Clent Demark, MD;  Location: [place] CATH LAB;  Service: Cardiovascular;;    No family history on file.  Social History:  reports that he has never smoked. He has never used smokeless tobacco. He reports that he does not drink alcohol or use illicit drugs.  Review of Systems - Cardiovascular ROS: positive for - coronary artery disease,  cerebrovascular disease   HYPERTENSION:  he has previously been on multiple drugs including labetalol and Diovan but not clear if he is taking any medications Appears to be only on metoprolol   No change in renal function   HYPERLIPIDEMIA:         The lipid abnormality consists of elevated LDL and this has been generally difficult to control with various drugs in the past probably because of noncompliance with medications and financial reasons.   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   He has had significant  anemia previously   Examination:   BP 118/72 mmHg  Pulse 77  Temp(Src) 98.3 F (36.8 C)  Resp 16  Ht 5\' 8"  (1.727 m)  Wt 172 lb 6.4 oz (78.2 kg)  BMI 26.22 kg/m2  SpO2 98%  Body mass index is 26.22 kg/(m^2).   No pedal edema  Assesment/PLAN:   1. Diabetes type 2, uncontrolled  The patient's diabetes control appears still inadequate with home blood sugar averaging 170 See history of present illness for details of his current management He does appear to need larger doses of mealtime coverage at most of his meals Need better assessment of postprandial readings after breakfast and supper Also since his fasting readings are recently higher will need more basal insulin Discussed blood sugar targets both before and after meals  Recommendations made today:  Start monitoring a few readings after breakfast and lunch  Insulin doses to be as follows:Lantus 46 units am. NOVOLOG 14 AM; 28 LUNCH- 14 at dinner     Encouraged him to start  some exercise  Balanced meals with enough protein at each meal   2. HYPERCHOLESTEROLEMIA: He has been on generic Lipitor and lipids are much better now from improve compliance. Liver functions are normal He thinks he is also compliant with his WelChol  Hypertension: Currently on no medications and blood pressure is high normal  Consider restarting low dose Diovan because of diabetes diabetes Need to reassess urine microalbumin  Counseling time over  50% of today's 25 minute visit   Verl Kitson 05/16/2015, 6:56 PM      Patient ID: Calvin Perez, male   DOB: 1941-06-02, 74 y.o.   MRN: [id number]   Reason for Appointment: Diabetes follow-up   History of Present Illness  Diagnosis: Type 2 DIABETES MELITUS, since 1984       Past history:  He has been on insulin regimen since about 2004. He has required large doses of insulin insulin persistently but with inadequate control usually Had been relatively intolerant to metformin and did not want to pay for Actos His hemoglobin A1c in the past has usually been about 8-9%  He usually has problems with  postprandial hyperglycemia despite taking large dose of mealtime insulin  RECENT history: His overall control has been persistently poor and A1c was still over 9% in 12/14.  Because of low sugars in the mornings on his last visit his Lantus was changed to the morning and the dose reduced from 50 down to 40 units He has not monitored his postprandial  readings consistently and only some readings after lunch until Friday of last week Most of his readings in the afternoons are high and he does not know why some fasting readings are as high as 300 His wife thinks that he is fairly compliant with his insulin Previously has been relatively noncompliant with followup, insulin regimen and glucose monitoring also  Oral hypoglycemic drugs: None at present        Side effects from medications: ? GI side effects from metformin Insulin regimen: Lantus  40  units am. NOVOLOG 07-26-09    Monitors blood glucose: Once a day.    Glucometer: One Touch.          Blood Glucose readings:  PREMEAL Breakfast Lunch Dinner Bedtime Overall  Glucose range:  84-302    134-238     Mean/median:  164    221    172    POST-MEAL PC Breakfast PC Lunch PC Dinner  Glucose range:  26-712    Mean/median:   186     Hypoglycemia frequency: Minimal, low normal reading of 73 at 4 AM once            Meals: 3 meals per day.     he is generally eating vegetarian meals but his wife says he is getting an egg for breakfast and some chicken at dinner      Physical activity: exercise: Minimal        Complications: are: Atherosclerotic vascular disease, recent stroke     Lab Results  Component Value Date   HGBA1C 9.7* 03/29/2015   HGBA1C 9.0* 10/22/2014   HGBA1C 8.7* 07/28/2014   Lab Results  Component Value Date   MICROALBUR 1.4 10/22/2014   LDLCALC 84 01/18/2015   CREATININE 1.22 03/29/2015    HYPERTENSION:  he has previously been on multiple drugs including labetalol and Diovan but not clear if he is taking any medications Appears to be only on metoprolol with fairly good blood pressure today No change in renal function     Medication List       This list is accurate as of: 05/13/15 11:59 PM.  Always use your most recent med list.               ACCU-CHEK AVIVA PLUS test strip  Generic drug:  glucose blood  USE AS DIRECTED 3 TIMES A DAY *DX :250.02     ALPRAZolam 0.25 MG tablet  Commonly known as:  XANAX  Take 1 tablet (0.25 mg total) by mouth at bedtime.     amiodarone 200 MG tablet  Commonly known as:  PACERONE  Take 1 tablet (200 mg total) by mouth daily.     aspirin 81 MG tablet  Take 81 mg by mouth daily.     aspirin 325 MG tablet  325 mg.     atorvastatin 80 MG tablet  Commonly known as:  LIPITOR  Take 1 tablet (80 mg total) by mouth daily.     calcium carbonate 600 MG Tabs tablet  Commonly known as:  OS-CAL  Take 1,200 mg  by mouth daily with breakfast.     colesevelam 625 MG tablet  Commonly known as:  WELCHOL  Take 3 tablets twice a day     ferrous sulfate 325 (65 FE) MG tablet  Take 1 tablet (325 mg total) by mouth daily with breakfast.     insulin glargine 100 UNIT/ML injection  Commonly known as:  LANTUS  Inject 0.46 mLs (46 Units total) into the skin daily.     insulin lispro 100 UNIT/ML KiwkPen  Commonly known as:  HUMALOG KWIKPEN  Inject 14 units in the am, 40 units with lunch and 14 units with dinner.     insulin regular 100 units/mL injection  Commonly known as:  HUMULIN R  Take 25 units 3 times a day 15-30 minutes before meals     isosorbide mononitrate 30 MG 24 hr tablet  Commonly known as:  IMDUR  Take 1 tablet (30 mg total) by mouth daily.     KLOR-CON 10 10 MEQ tablet  Generic drug:  potassium chloride  Take 10 mEq by mouth daily.     memantine 10 MG tablet  Commonly known as:  NAMENDA  Take 10 mg by mouth daily.     metFORMIN 500 MG 24 hr tablet  Commonly known as:  GLUCOPHAGE-XR  Take 3 tablets (1,500 mg total) by mouth daily with supper.  metoprolol tartrate 12.5 mg Tabs tablet  Commonly known as:  LOPRESSOR  Take 0.5 tablets (12.5 mg total) by mouth 2 (two) times daily.     NESINA 25 MG Tabs  Generic drug:  Alogliptin Benzoate  Take 25 mg by mouth.     nitroGLYCERIN 0.4 MG SL tablet  Commonly known as:  NITROSTAT  Place 1 tablet (0.4 mg total) under the tongue every 5 (five) minutes x 3 doses as needed for chest pain.     ticagrelor 90 MG Tabs tablet  Commonly known as:  BRILINTA  Take 1 tablet (90 mg total) by mouth 2 (two) times daily.     valsartan 160 MG tablet  Commonly known as:  DIOVAN  Take 1 tablet (160 mg total) by mouth daily.     VICKS VAPORUB EX  Apply 1 application topically daily as needed.        Allergies: No Known Allergies  Past Medical History  Diagnosis Date  . Hypertension   . Arthritis   . Dyslipidemia   . Coronary artery  disease   . Diabetes mellitus     insulin dependent    Past Surgical History  Procedure Laterality Date  . Open heart surgery  1998  . Coronary artery bypass graft    . Cardiac catheterization    . Left heart catheterization with coronary angiogram N/A 09/15/2013    Procedure: LEFT HEART CATHETERIZATION WITH CORONARY ANGIOGRAM;  Surgeon: Clent Demark, MD;  Location: [facility] CATH LAB;  Service: Cardiovascular;  Laterality: N/A;  . Percutaneous stent intervention  09/15/2013    Procedure: PERCUTANEOUS STENT INTERVENTION;  Surgeon: Clent Demark, MD;  Location: [place] CATH LAB;  Service: Cardiovascular;;    No family history on file.  Social History:  reports that he has never smoked. He has never used smokeless tobacco. He reports that he does not drink alcohol or use illicit drugs.  Review of Systems - Cardiovascular ROS: positive for - coronary artery disease, cerebrovascular disease  HYPERLIPIDEMIA:         The lipid abnormality consists of elevated LDL and this has been generally difficult to control with various drugs in the past mostly because of noncompliance with medications and financial reasons.   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   He has had significant anemia previously, now normal  Lab Results  Component Value Date   WBC 5.9 01/18/2015   HGB 13.6 01/18/2015   HCT 41.5 01/18/2015   MCV 81.9 01/18/2015   PLT 245.0 01/18/2015   Diabetic foot exam was normal in 01/2015  His daughter says that he is having difficulty with hearing  in the right side and wants to be evaluated with hearing test, referral done   Examination:   BP 118/72 mmHg  Pulse 77  Temp(Src) 98.3 F (36.8 C)  Resp 16  Ht 5\' 8"  (1.727 m)  Wt 172 lb 6.4 oz (78.2 kg)  BMI 26.22 kg/m2  SpO2 98%  Body mass  index is 26.22 kg/(m^2).    No ankle edema   Assesment/PLAN:   Diabetes type 2, uncontrolled See history of present illness for detailed discussion of his current management, blood sugar patterns and problems identified  The patient's diabetes control is  significantly improved recently especially in the last 1-2 weeks and not clear why He may be possibly responding to the Susank which he is getting in the form of samples. Also not clear how long he has been taking this His fasting blood sugars are also better compared to last time even with not increasing his Lantus; however may be benefiting from changing the Lantus timing to the evening instead of morning Since he is not checking postprandial readings after breakfast and supper not clear what his overall picture is and no objective assessment with fructosamine in order A1c available recently He is trying to be active and has lost a little weight Discussed with his daughter today current regimen, need for balanced meals and when to check his blood sugar  For now will continue his regimen and change except reduce his Humalog dose at lunchtime   HYPERCHOLESTEROLEMIA: He needs follow-up levels on the next visit  Hypertension: Currently  controlled on present regimen   Counseling time on subjects discussed above is over 50% of today's 25 minute visit   Patient Instructions  Humalog 35 at lunch Stay on 45 Lantus  Check blood sugars on waking up .Marland Kitchen3-4  .Marland Kitchen times a week Also check blood sugars about 2 hours after a meal and do this after different meals by rotation Recommended blood sugar levels on waking up is 90-130 and about 2 hours after meal is 140-180 Please bring blood sugar  monitor to each visit.     Shiri Hodapp 05/16/2015, 6:56 PM

## 2015-05-15 ENCOUNTER — Other Ambulatory Visit: Payer: Self-pay | Admitting: Endocrinology

## 2015-05-17 ENCOUNTER — Other Ambulatory Visit: Payer: Self-pay | Admitting: Endocrinology

## 2015-06-14 ENCOUNTER — Other Ambulatory Visit: Payer: Self-pay | Admitting: *Deleted

## 2015-06-14 MED ORDER — GLUCOSE BLOOD VI STRP: ORAL_STRIP | Status: DC

## 2015-06-14 MED ORDER — VALSARTAN 160 MG PO TABS: ORAL_TABLET | ORAL | Status: DC

## 2015-06-21 ENCOUNTER — Other Ambulatory Visit: Payer: Self-pay

## 2015-06-24 ENCOUNTER — Ambulatory Visit: Payer: Self-pay | Admitting: Endocrinology

## 2015-06-24 ENCOUNTER — Encounter: Payer: Self-pay | Admitting: *Deleted

## 2015-06-24 ENCOUNTER — Other Ambulatory Visit: Payer: Self-pay

## 2015-07-05 ENCOUNTER — Other Ambulatory Visit (INDEPENDENT_AMBULATORY_CARE_PROVIDER_SITE_OTHER): Payer: Medicare HMO

## 2015-07-05 DIAGNOSIS — E1165 Type 2 diabetes mellitus with hyperglycemia: Secondary | ICD-10-CM | POA: Diagnosis not present

## 2015-07-05 DIAGNOSIS — IMO0002 Reserved for concepts with insufficient information to code with codable children: Secondary | ICD-10-CM

## 2015-07-05 LAB — COMPREHENSIVE METABOLIC PANEL
ALK PHOS: 70 U/L (ref 39–117)
ALT: 19 U/L (ref 0–53)
AST: 18 U/L (ref 0–37)
Albumin: 4.1 g/dL (ref 3.5–5.2)
BILIRUBIN TOTAL: 0.6 mg/dL (ref 0.2–1.2)
BUN: 17 mg/dL (ref 6–23)
CO2: 25 meq/L (ref 19–32)
Calcium: 9.6 mg/dL (ref 8.4–10.5)
Chloride: 102 mEq/L (ref 96–112)
Creatinine, Ser: 1.19 mg/dL (ref 0.40–1.50)
GFR: 63.21 mL/min (ref >60.00)
GLUCOSE: 239 mg/dL — AB (ref 70–99)
Potassium: 4.7 mEq/L (ref 3.5–5.1)
SODIUM: 135 meq/L (ref 135–145)
TOTAL PROTEIN: 7.7 g/dL (ref 6.0–8.3)

## 2015-07-05 LAB — HEMOGLOBIN A1C: Hgb A1c MFr Bld: 8.2 % — ABNORMAL HIGH (ref 4.6–6.5)

## 2015-07-07 ENCOUNTER — Other Ambulatory Visit: Payer: Self-pay | Admitting: Cardiovascular Disease

## 2015-07-07 DIAGNOSIS — I6523 Occlusion and stenosis of bilateral carotid arteries: Secondary | ICD-10-CM

## 2015-07-08 ENCOUNTER — Other Ambulatory Visit: Payer: Self-pay | Admitting: *Deleted

## 2015-07-08 ENCOUNTER — Encounter: Payer: Self-pay | Admitting: Endocrinology

## 2015-07-08 ENCOUNTER — Ambulatory Visit (INDEPENDENT_AMBULATORY_CARE_PROVIDER_SITE_OTHER): Payer: Medicare HMO | Admitting: Endocrinology

## 2015-07-08 VITALS — BP 120/64 | HR 67 | Temp 98.2°F | Resp 16 | Ht 68.0 in | Wt 176.4 lb

## 2015-07-08 DIAGNOSIS — I10 Essential (primary) hypertension: Secondary | ICD-10-CM | POA: Diagnosis not present

## 2015-07-08 DIAGNOSIS — Z23 Encounter for immunization: Secondary | ICD-10-CM

## 2015-07-08 DIAGNOSIS — E1165 Type 2 diabetes mellitus with hyperglycemia: Secondary | ICD-10-CM

## 2015-07-08 DIAGNOSIS — Z794 Long term (current) use of insulin: Secondary | ICD-10-CM | POA: Diagnosis not present

## 2015-07-08 NOTE — Patient Instructions (Addendum)
Humalog 20 at Bfst and dinner, 35 at lunch and do not change Lantus  Check blood sugars on waking up ..3-4.Marland Kitchen times a week Also check blood sugars about 2 hours after a meal and do this after different meals by rotation  Recommended blood sugar levels on waking up is 90-130 and about 2 hours after meal is 140-180 Please bring blood sugar monitor to each visit.  Take Metformin at lunch and dinner and take Nesina daily  May use Relion R insulin instead of Humalog, 30 min before meal

## 2015-07-08 NOTE — Progress Notes (Signed)
Patient ID: RIDHAAN DREIBELBIS, male   DOB: 01/02/1941, 74 y.o.   MRN: [id number]   Reason for Appointment: Diabetes follow-up   History of Present Illness  Diagnosis: Type 2 DIABETES MELITUS, since 1984       Past history:  He has been on insulin regimen since about 2004. He has required large doses of insulin insulin persistently but with inadequate control usually Had been relatively intolerant to metformin and did not want to pay for Actos His hemoglobin A1c in the past has usually been about 8-9%  He usually has problems with  postprandial hyperglycemia despite taking large dose of mealtime insulin  RECENT history:  Insulin regimen: Lantus 50 units am.  Humalog 15-35-15 before meal  His overall control has been persistently poor and A1c is usually around 9% However A1c is somewhat improved at 8.2% Apparently he has had better blood sugars with taking Nesina along with his insulin regimen, was given samples by PCP  However on his own he has gone up 5 units on the Lantus and reduced his Humalog by 5 units, probably because of the high cost of Humalog He now is taking Nesina only when the blood sugar is high over 200 and has not picked up a prescription was called in  Current blood sugar patterns and problems:  His fasting readings are variable but still overall high and averaging about 190  Blood sugars are somewhat better at suppertime  Usually not checking readings after breakfast but these are still relatively high  Has not done any readings after supper again despite reminders  He is trying to be as active and preoperatively doing this at the [facility]  His daughter is trying to help him with his home monitoring and management  Oral hypoglycemic drugs: Nesina irregularly        Side effects from medications: ? GI side effects from metformin  Monitors blood glucose:  1.9 times a day.    Glucometer:  Accu-Chek         Blood Glucose readings:   Mean values apply  above for all meters except median for One Touch  PRE-MEAL Fasting Lunch Dinner Bedtime Overall  Glucose range:  141-272  202, 221  135-256     Mean/median:  190    162    182+/-35     Meals: 3 meals per day. Lunch 1-2 pm, dinner 7-8 PM He is generally eating vegetarian meals but his wife says he is getting an egg for breakfast and some chicken  at lunch       Physical activity: exercise: 3-4x/7 at the [facility], recently irregular           Complications: are: Atherosclerotic vascular disease, stroke     Wt Readings from Last 3 Encounters:  07/08/15 176 lb 6.4 oz (80.015 kg)  05/13/15 172 lb 6.4 oz (78.2 kg)  03/29/15 175 lb 12.8 oz (79.742 kg)   Lab Results  Component Value Date   HGBA1C 8.2* 07/05/2015   HGBA1C 9.7* 03/29/2015   HGBA1C 9.0* 10/22/2014   Lab Results  Component Value Date   MICROALBUR 1.4 10/22/2014   LDLCALC 84 01/18/2015   CREATININE 1.19 07/05/2015   No visits with results within 1 Day(s) from this visit. Latest known visit with results is:  Appointment on 07/05/2015  Component Date Value Ref Range Status  . Hgb A1c MFr Bld 07/05/2015 8.2* 4.6 - 6.5 % Final   Glycemic Control Guidelines for People with Diabetes:Non Diabetic:  <  6%Goal of Therapy: <7%Additional Action Suggested:  >8%   . Sodium 07/05/2015 135  135 - 145 mEq/L Final  . Potassium 07/05/2015 4.7  3.5 - 5.1 mEq/L Final  . Chloride 07/05/2015 102  96 - 112 mEq/L Final  . CO2 07/05/2015 25  19 - 32 mEq/L Final  . Glucose, Bld 07/05/2015 239* 70 - 99 mg/dL Final  . BUN 07/05/2015 17  6 - 23 mg/dL Final  . Creatinine, Ser 07/05/2015 1.19  0.40 - 1.50 mg/dL Final  . Total Bilirubin 07/05/2015 0.6  0.2 - 1.2 mg/dL Final  . Alkaline Phosphatase 07/05/2015 70  39 - 117 U/L Final  . AST 07/05/2015 18  0 - 37 U/L Final  . ALT 07/05/2015 19  0 - 53 U/L Final  . Total Protein 07/05/2015 7.7  6.0 - 8.3 g/dL Final  . Albumin 07/05/2015 4.1  3.5 - 5.2 g/dL Final  . Calcium 07/05/2015 9.6  8.4 - 10.5 mg/dL  Final  . GFR 07/05/2015 63.21  >60.00 mL/min Final           Medication List       This list is accurate as of: 07/08/15  3:09 PM.  Always use your most recent med list.               aspirin 81 MG tablet  Take 81 mg by mouth daily.     aspirin 325 MG tablet  325 mg.     atorvastatin 80 MG tablet  Commonly known as:  LIPITOR  Take 1 tablet (80 mg total) by mouth daily.     calcium carbonate 600 MG Tabs tablet  Commonly known as:  OS-CAL  Take 1,200 mg by mouth daily with breakfast.     colesevelam 625 MG tablet  Commonly known as:  WELCHOL  Take 3 tablets twice a day     ferrous sulfate 325 (65 FE) MG tablet  Take 1 tablet (325 mg total) by mouth daily with breakfast.     glucose blood test strip  Commonly known as:  ACCU-CHEK AVIVA PLUS  USE AS DIRECTED 3 TIMES A DAY Dx code E11.65     insulin glargine 100 UNIT/ML injection  Commonly known as:  LANTUS  Inject 0.46 mLs (46 Units total) into the skin daily.     insulin lispro 100 UNIT/ML KiwkPen  Commonly known as:  HUMALOG KWIKPEN  Inject 14 units in the am, 40 units with lunch and 14 units with dinner.     isosorbide mononitrate 30 MG 24 hr tablet  Commonly known as:  IMDUR  Take 1 tablet (30 mg total) by mouth daily.     KLOR-CON 10 10 MEQ tablet  Generic drug:  potassium chloride  Take 10 mEq by mouth daily.     memantine 10 MG tablet  Commonly known as:  NAMENDA  Take 10 mg by mouth daily.     metFORMIN 500 MG 24 hr tablet  Commonly known as:  GLUCOPHAGE-XR  Take 3 tablets (1,500 mg total) by mouth daily with supper.     metoprolol tartrate 12.5 mg Tabs tablet  Commonly known as:  LOPRESSOR  Take 0.5 tablets (12.5 mg total) by mouth 2 (two) times daily.     NESINA 25 MG Tabs  Generic drug:  Alogliptin Benzoate  Take 25 mg by mouth.     nitroGLYCERIN 0.4 MG SL tablet  Commonly known as:  NITROSTAT  Place 1 tablet (0.4 mg total) under the tongue every  5 (five) minutes x 3 doses as needed  for chest pain.     NOVOLOG FLEXPEN 100 UNIT/ML FlexPen  Generic drug:  insulin aspart  Inject 5 Units into the skin 2 (two) times daily.     ticagrelor 90 MG Tabs tablet  Commonly known as:  BRILINTA  Take 1 tablet (90 mg total) by mouth 2 (two) times daily.     valsartan 160 MG tablet  Commonly known as:  DIOVAN  TAKE 1 TABLET (160 MG TOTAL) BY MOUTH DAILY.     VICKS VAPORUB EX  Apply 1 application topically daily as needed.        Allergies: No Known Allergies  Past Medical History  Diagnosis Date  . Hypertension   . Arthritis   . Dyslipidemia   . Coronary artery disease   . Diabetes mellitus     insulin dependent    Past Surgical History  Procedure Laterality Date  . Open heart surgery  1998  . Coronary artery bypass graft    . Cardiac catheterization    . Left heart catheterization with coronary angiogram N/A 09/15/2013    Procedure: LEFT HEART CATHETERIZATION WITH CORONARY ANGIOGRAM;  Surgeon: Clent Demark, MD;  Location: [facility] CATH LAB;  Service: Cardiovascular;  Laterality: N/A;  . Percutaneous stent intervention  09/15/2013    Procedure: PERCUTANEOUS STENT INTERVENTION;  Surgeon: Clent Demark, MD;  Location: [place] CATH LAB;  Service: Cardiovascular;;    No family history on file.  Social History:  reports that he has never smoked. He has never used smokeless tobacco. He reports that he does not drink alcohol or use illicit drugs.  Review of Systems - Cardiovascular ROS: positive for - coronary artery disease, cerebrovascular disease   HYPERTENSION:  he has previously been on multiple drugs including labetalol and Diovan but not clear if he is taking any medications Appears to be only on metoprolol   No change in renal function   HYPERLIPIDEMIA:         The lipid abnormality consists of elevated LDL and this has been generally difficult to control with various drugs in the past probably because of noncompliance with medications and financial reasons.     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   He has had significant anemia previously   Examination:   BP 120/64 mmHg  Pulse 67  Temp(Src) 98.2 F (36.8 C)  Resp 16  Ht 5\' 8"  (1.727 m)  Wt 176 lb 6.4 oz (80.015 kg)  BMI 26.83 kg/m2  SpO2 97%  Body mass index is 26.83 kg/(m^2).   No pedal edema  Assesment/PLAN:   1. Diabetes type 2, uncontrolled  The patient's diabetes control appears still inadequate with home blood sugar averaging 170 See history of present illness for details of his current management He does appear to need larger doses of mealtime coverage at most of his meals Need better assessment of postprandial readings after breakfast and supper Also since his fasting readings are recently  higher will need more basal insulin Discussed blood sugar targets both before and after meals  Recommendations made today:  Start monitoring a few readings after breakfast and lunch  Insulin doses to be as follows:Lantus 46 units am. NOVOLOG 14 AM; 28 LUNCH- 14 at dinner     Encouraged him to start some exercise  Balanced meals with enough protein at each meal   2. HYPERCHOLESTEROLEMIA: He has been on generic Lipitor and lipids are much better now from improve compliance. Liver functions are normal He thinks he is also compliant with his WelChol  Hypertension: Currently on no medications and blood pressure is high normal  Consider restarting low dose Diovan because of diabetes diabetes Need to reassess urine microalbumin  Counseling  time over  50% of today's 25 minute visit   Marilin Kofman 07/08/2015, 3:09 PM      Patient ID: JAHSIAH CARPENTER, male   DOB: 1940/11/20, 74 y.o.   MRN: [id number]   Reason for Appointment: Diabetes follow-up   History of Present Illness  Diagnosis: Type 2 DIABETES MELITUS, since 1984       Past history:  He has been on insulin regimen since about 2004. He has required large doses of insulin insulin persistently but with inadequate control usually Had been relatively intolerant to metformin and did not want to pay for Actos His hemoglobin A1c in the past has usually been about 8-9%  He usually has problems with  postprandial hyperglycemia despite taking large dose of mealtime insulin  RECENT history: His overall control has been persistently poor and A1c was still over 9% in 12/14.  Because of low sugars in the mornings on his last visit his Lantus was changed to the morning and the dose reduced from 50 down to 40 units He has not monitored his postprandial  readings consistently and only some readings after lunch until Friday of last week Most of his readings in the afternoons are high and he does not know why some fasting readings are as high as 300 His wife thinks that he is fairly compliant with his insulin Previously has been relatively noncompliant with followup, insulin regimen and glucose monitoring also  Oral hypoglycemic drugs: None at present        Side effects from medications: ? GI side effects from metformin Insulin regimen: Lantus 40  units am. NOVOLOG 07-26-09    Monitors blood glucose: Once a day.    Glucometer: One Touch.          Blood Glucose readings:  PREMEAL Breakfast Lunch Dinner Bedtime Overall  Glucose range:  84-302    134-238     Mean/median:  164    221    172    POST-MEAL PC Breakfast PC Lunch PC Dinner  Glucose range:   74-288    Mean/median:   186     Hypoglycemia frequency: Minimal, low normal reading of 73 at 4 AM once            Meals: 3  meals per day.     he is generally eating vegetarian meals but his wife says he is getting an egg for breakfast and some chicken at dinner      Physical activity: exercise: Minimal        Complications: are: Atherosclerotic vascular disease, recent stroke     Lab Results  Component Value Date   HGBA1C 8.2* 07/05/2015   HGBA1C 9.7* 03/29/2015   HGBA1C 9.0* 10/22/2014   Lab Results  Component Value  Date   MICROALBUR 1.4 10/22/2014   LDLCALC 84 01/18/2015   CREATININE 1.19 07/05/2015    HYPERTENSION:  he has previously been on multiple drugs including labetalol and Diovan but not clear if he is taking any medications Appears to be only on metoprolol with fairly good blood pressure today No change in renal function     Medication List       This list is accurate as of: 07/08/15  3:09 PM.  Always use your most recent med list.               aspirin 81 MG tablet  Take 81 mg by mouth daily.     aspirin 325 MG tablet  325 mg.     atorvastatin 80 MG tablet  Commonly known as:  LIPITOR  Take 1 tablet (80 mg total) by mouth daily.     calcium carbonate 600 MG Tabs tablet  Commonly known as:  OS-CAL  Take 1,200 mg by mouth daily with breakfast.     colesevelam 625 MG tablet  Commonly known as:  WELCHOL  Take 3 tablets twice a day     ferrous sulfate 325 (65 FE) MG tablet  Take 1 tablet (325 mg total) by mouth daily with breakfast.     glucose blood test strip  Commonly known as:  ACCU-CHEK AVIVA PLUS  USE AS DIRECTED 3 TIMES A DAY Dx code E11.65     insulin glargine 100 UNIT/ML injection  Commonly known as:  LANTUS  Inject 0.46 mLs (46 Units total) into the skin daily.     insulin lispro 100 UNIT/ML KiwkPen  Commonly known as:  HUMALOG KWIKPEN  Inject 14 units in the am, 40 units with lunch and 14 units with dinner.     isosorbide mononitrate 30 MG 24 hr tablet  Commonly known as:  IMDUR  Take 1 tablet (30 mg total) by mouth daily.     KLOR-CON 10 10 MEQ tablet   Generic drug:  potassium chloride  Take 10 mEq by mouth daily.     memantine 10 MG tablet  Commonly known as:  NAMENDA  Take 10 mg by mouth daily.     metFORMIN 500 MG 24 hr tablet  Commonly known as:  GLUCOPHAGE-XR  Take 3 tablets (1,500 mg total) by mouth daily with supper.     metoprolol tartrate 12.5 mg Tabs tablet  Commonly known as:  LOPRESSOR  Take 0.5 tablets (12.5 mg total) by mouth 2 (two) times daily.     NESINA 25 MG Tabs  Generic drug:  Alogliptin Benzoate  Take 25 mg by mouth.     nitroGLYCERIN 0.4 MG SL tablet  Commonly known as:  NITROSTAT  Place 1 tablet (0.4 mg total) under the tongue every 5 (five) minutes x 3 doses as needed for chest pain.     NOVOLOG FLEXPEN 100 UNIT/ML FlexPen  Generic drug:  insulin aspart  Inject 5 Units into the skin 2 (two) times daily.     ticagrelor 90 MG Tabs tablet  Commonly known as:  BRILINTA  Take 1 tablet (90 mg total) by mouth 2 (two) times daily.     valsartan 160 MG tablet  Commonly known as:  DIOVAN  TAKE 1 TABLET (160 MG TOTAL) BY MOUTH DAILY.     VICKS VAPORUB EX  Apply 1 application topically daily as needed.        Allergies: No Known Allergies  Past Medical History  Diagnosis Date  .  Hypertension   . Arthritis   . Dyslipidemia   . Coronary artery disease   . Diabetes mellitus     insulin dependent    Past Surgical History  Procedure Laterality Date  . Open heart surgery  1998  . Coronary artery bypass graft    . Cardiac catheterization    . Left heart catheterization with coronary angiogram N/A 09/15/2013    Procedure: LEFT HEART CATHETERIZATION WITH CORONARY ANGIOGRAM;  Surgeon: Clent Demark, MD;  Location: [facility] CATH LAB;  Service: Cardiovascular;  Laterality: N/A;  . Percutaneous stent intervention  09/15/2013    Procedure: PERCUTANEOUS STENT INTERVENTION;  Surgeon: Clent Demark, MD;  Location: [place] CATH LAB;  Service: Cardiovascular;;    No family history on file.  Social History:   reports that he has never smoked. He has never used smokeless tobacco. He reports that he does not drink alcohol or use illicit drugs.  Review of Systems - Cardiovascular ROS: positive for - coronary artery disease, cerebrovascular disease  HYPERLIPIDEMIA:         The lipid abnormality consists of elevated LDL and this has been generally difficult to control with various drugs in the past mostly because of noncompliance with medications and financial reasons.   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  Still having some memory issues   Diabetic foot exam was normal in 01/2015    Examination:   BP 120/64 mmHg  Pulse 67  Temp(Src) 98.2 F (36.8 C)  Resp 16  Ht 5\' 8"  (1.727 m)  Wt 176 lb 6.4 oz (80.015 kg)  BMI 26.83 kg/m2  SpO2 97%  Body mass index is 26.83 kg/(m^2).    No ankle edema   Assesment/PLAN:   Diabetes type 2, uncontrolled See history of present illness for detailed discussion of his current management, blood sugar patterns and problems identified  Although his A1c is down to 8.2% his glucose readings are still relatively high As discussed above he is primarily checking his blood sugars in the mornings before breakfast only He has difficulty affording his insulin and is using less than prescribed doses of Humalog He has had significant improvement in his control with using Nesina given by PCP but he is now taking this only as needed when blood sugars are over 200 Discussed that this is essential to be used every day regardless of blood sugar  For  now since his blood sugars are probably higher after meals will go up on the breakfast and suppertime Humalog Also most likely will have better fasting readings with taking Nesina daily Since his fasting blood sugars are high he may need to increase Lantus further if needed Advised him to get a prescription for Nesina  He will switch to [employer] brand of regular insulin when he runs out of Novolog or Humalog and take the insulin 30 units before eating  HYPERCHOLESTEROLEMIA: He needs follow-up levels on the next visit  Hypertension: Consistently controlled on present regimen  Counseling time on subjects discussed above is  over 50% of today's 25 minute visit   Patient Instructions  Humalog 20 at Bfst and dinner, 35 at lunch and do not change Lantus  Check blood sugars on waking up ..3-4.Marland Kitchen times a week Also check blood sugars about 2 hours after a meal and do this after different meals by rotation  Recommended blood sugar levels on waking up is 90-130 and about 2 hours after meal is 140-180 Please bring blood sugar monitor to each visit.  Take Metformin at lunch and dinner and take Nesina daily  May use Relion R insulin instead of Humalog, 30 min before meal    Mj Willis 07/08/2015, 3:09 PM

## 2015-07-11 ENCOUNTER — Other Ambulatory Visit: Payer: Self-pay | Admitting: Endocrinology

## 2015-07-14 ENCOUNTER — Ambulatory Visit (HOSPITAL_COMMUNITY)
Admission: RE | Admit: 2015-07-14 | Discharge: 2015-07-14 | Disposition: A | Discharge status: Home / Self Care | Payer: Medicare HMO | Source: Ambulatory Visit | Attending: Endocrinology | Admitting: Endocrinology

## 2015-07-14 DIAGNOSIS — I6523 Occlusion and stenosis of bilateral carotid arteries: Secondary | ICD-10-CM | POA: Diagnosis not present

## 2015-07-14 NOTE — Progress Notes (Signed)
VASCULAR LAB PRELIMINARY  PRELIMINARY  PRELIMINARY  PRELIMINARY  Carotid duplex  completed.    Preliminary report:  Bilateral:  1-39% ICA stenosis.  Vertebral artery flow is antegrade.  Right carotid stent is patent.    Orbin Mayeux, RVT 07/14/2015, 9:26 AM

## 2015-08-09 ENCOUNTER — Other Ambulatory Visit: Payer: Self-pay | Admitting: Endocrinology

## 2015-08-13 ENCOUNTER — Other Ambulatory Visit (INDEPENDENT_AMBULATORY_CARE_PROVIDER_SITE_OTHER): Payer: Medicare HMO

## 2015-08-13 DIAGNOSIS — Z794 Long term (current) use of insulin: Secondary | ICD-10-CM

## 2015-08-13 DIAGNOSIS — E1165 Type 2 diabetes mellitus with hyperglycemia: Secondary | ICD-10-CM

## 2015-08-13 LAB — BASIC METABOLIC PANEL
BUN: 16 mg/dL (ref 6–23)
CHLORIDE: 101 meq/L (ref 96–112)
CO2: 29 meq/L (ref 19–32)
Calcium: 10.3 mg/dL (ref 8.4–10.5)
Creatinine, Ser: 1.15 mg/dL (ref 0.40–1.50)
GFR: 65.73 mL/min (ref >60.00)
GLUCOSE: 192 mg/dL — AB (ref 70–99)
POTASSIUM: 4.8 meq/L (ref 3.5–5.1)
Sodium: 137 mEq/L (ref 135–145)

## 2015-08-13 LAB — LIPID PANEL
CHOL/HDL RATIO: 4
CHOLESTEROL: 127 mg/dL (ref 0–200)
HDL: 31.9 mg/dL — ABNORMAL LOW (ref >39.00)
LDL CALC: 74 mg/dL (ref 0–99)
NONHDL: 94.67
Triglycerides: 103 mg/dL (ref 0.0–149.0)
VLDL: 20.6 mg/dL (ref 0.0–40.0)

## 2015-08-13 LAB — URINALYSIS, ROUTINE W REFLEX MICROSCOPIC
Bilirubin Urine: NEGATIVE
Hgb urine dipstick: NEGATIVE
KETONES UR: NEGATIVE
Leukocytes, UA: NEGATIVE
NITRITE: NEGATIVE
PH: 6 (ref 5.0–8.0)
SPECIFIC GRAVITY, URINE: 1.01 (ref 1.000–1.030)
Total Protein, Urine: NEGATIVE
URINE GLUCOSE: NEGATIVE
Urobilinogen, UA: 0.2 (ref 0.0–1.0)

## 2015-08-13 LAB — MICROALBUMIN / CREATININE URINE RATIO
Creatinine,U: 135.2 mg/dL
MICROALB UR: 1.3 mg/dL (ref 0.0–1.9)
Microalb Creat Ratio: 1 mg/g (ref 0.0–30.0)

## 2015-08-14 LAB — FRUCTOSAMINE: Fructosamine: 311 umol/L — ABNORMAL HIGH (ref 0–285)

## 2015-08-19 ENCOUNTER — Encounter: Payer: Self-pay | Admitting: Endocrinology

## 2015-08-19 ENCOUNTER — Ambulatory Visit (INDEPENDENT_AMBULATORY_CARE_PROVIDER_SITE_OTHER): Payer: Medicare HMO | Admitting: Endocrinology

## 2015-08-19 VITALS — BP 124/72 | HR 71 | Temp 98.6°F | Resp 14 | Ht 68.0 in | Wt 173.4 lb

## 2015-08-19 DIAGNOSIS — Z794 Long term (current) use of insulin: Secondary | ICD-10-CM

## 2015-08-19 DIAGNOSIS — E1165 Type 2 diabetes mellitus with hyperglycemia: Secondary | ICD-10-CM | POA: Diagnosis not present

## 2015-08-19 DIAGNOSIS — I10 Essential (primary) hypertension: Secondary | ICD-10-CM

## 2015-08-19 DIAGNOSIS — E7800 Pure hypercholesterolemia, unspecified: Secondary | ICD-10-CM

## 2015-08-19 NOTE — Progress Notes (Signed)
Patient ID: Calvin Perez, male   DOB: [birth year], 74 y.o.   MRN: [id number]   Reason for Appointment: Diabetes follow-up   History of Present Illness  Diagnosis: Type 2 DIABETES MELITUS, since 1984       Past history:  He has been on insulin regimen since about 2004. He has required large doses of insulin insulin persistently but with inadequate control usually Had been relatively intolerant to metformin and did not want to pay for Actos His hemoglobin A1c in the past has usually been about 8-9%  He usually has problems with  postprandial hyperglycemia despite taking large dose of mealtime insulin  RECENT history:  Insulin regimen: Lantus 46 units am.  Humalog 15-30-15 before meals  His overall control has been improving this year He has had more help with his family especially his daughter and compliance Also appears to have better control of his blood sugars with adding Nesina Also on his last visit he was told to start metformin 500 mg twice a day and appears to be benefiting from this also He claims that when he takes the metformin in the afternoon his blood sugar will get low and may not consistently take it and also may not take his evening insulin consistently because of this  Fructosamine is still relatively higher 311  He tends to adjust his insulin doses on his own and does not follow instructions consistently  that are given to him   Current blood sugar patterns and problems:  His fasting readings are variable but improved from the last visit; also not clear if some of his readings in the mornings are after eating  Blood sugars are significantly variable around lunchtime although not checking as much recently; blood sugars after breakfast or not consistently high  He does tend to have relatively lower readings between lunch and supper and is taking the highest amount of Humalog at lunchtime still.  Has had 2 low readings but not recently  Has not done any  readings after supper again despite reminders  He is trying to be active on some days at the [facility]  His daughter is trying to help him with his home monitoring and management Meals: 3 meals per day. Lunch 1-2 pm, dinner 7-8 PM  Oral hypoglycemic drugs: Nesina, metformin 500 mg twice a day        Side effects from medications: ? GI side effects from metformin  Monitors blood glucose:  1.9 times a day.    Glucometer:  Accu-Chek         Blood Glucose readings:   Mean values apply above for all meters except median for One Touch  PRE-MEAL  morning  Lunch Dinner Bedtime Overall  Glucose range:  91-178   138-256   72-134   81   58-280   Mean/median:  160      148+/-50    He is generally eating vegetarian meals, he is getting an egg for breakfast and some chicken  at lunch       Physical activity: exercise: 3-4x/7 at the [facility], recently irregular           Complications: are: Atherosclerotic vascular disease, stroke     Wt Readings from Last 3 Encounters:  08/19/15 173 lb 6.4 oz (78.654 kg)  07/08/15 176 lb 6.4 oz (80.015 kg)  05/13/15 172 lb 6.4 oz (78.2 kg)   Lab Results  Component Value Date   HGBA1C 8.2* 07/05/2015   HGBA1C 9.7* 03/29/2015  HGBA1C 9.0* 10/22/2014   Lab Results  Component Value Date   MICROALBUR 1.3 08/13/2015   LDLCALC 74 08/13/2015   CREATININE 1.15 08/13/2015   No visits with results within 1 Day(s) from this visit. Latest known visit with results is:  Appointment on 08/13/2015  Component Date Value Ref Range Status  . Sodium 08/13/2015 137  135 - 145 mEq/L Final  . Potassium 08/13/2015 4.8  3.5 - 5.1 mEq/L Final  . Chloride 08/13/2015 101  96 - 112 mEq/L Final  . CO2 08/13/2015 29  19 - 32 mEq/L Final  . Glucose, Bld 08/13/2015 192* 70 - 99 mg/dL Final  . BUN 08/13/2015 16  6 - 23 mg/dL Final  . Creatinine, Ser 08/13/2015 1.15  0.40 - 1.50 mg/dL Final  . Calcium 08/13/2015 10.3  8.4 - 10.5 mg/dL Final  . GFR 08/13/2015 65.73  >60.00 mL/min Final    . Fructosamine 08/13/2015 311* 0 - 285 umol/L Final   Comment: Published reference interval for apparently healthy subjects between age 77 and 18 is 65 - 285 umol/L and in a poorly controlled diabetic population is 228 - 563 umol/L with a mean of 396 umol/L.   Marland Kitchen Cholesterol 08/13/2015 127  0 - 200 mg/dL Final   ATP III Classification       Desirable:  < 200 mg/dL               Borderline High:  200 - 239 mg/dL          High:  > = 240 mg/dL  . Triglycerides 08/13/2015 103.0  0.0 - 149.0 mg/dL Final   Normal:  <150 mg/dLBorderline High:  150 - 199 mg/dL  . HDL 08/13/2015 31.90* >39.00 mg/dL Final  . VLDL 08/13/2015 20.6  0.0 - 40.0 mg/dL Final  . LDL Cholesterol 08/13/2015 74  0 - 99 mg/dL Final  . Total CHOL/HDL Ratio 08/13/2015 4   Final                  Men          Women1/2 Average Risk     3.4          3.3Average Risk          5.0          4.42X Average Risk          9.6          7.13X Average Risk          15.0          11.0                      . NonHDL 08/13/2015 94.67   Final   NOTE:  Non-HDL goal should be 30 mg/dL higher than patient's LDL goal (i.e. LDL goal of < 70 mg/dL, would have non-HDL goal of < 100 mg/dL)  . Microalb, Ur 08/13/2015 1.3  0.0 - 1.9 mg/dL Final  . Creatinine,U 08/13/2015 135.2   Final  . Microalb Creat Ratio 08/13/2015 1.0  0.0 - 30.0 mg/g Final  . Color, Urine 08/13/2015 YELLOW  Yellow;Lt. Yellow Final  . APPearance 08/13/2015 CLEAR  Clear Final  . Specific Gravity, Urine 08/13/2015 1.010  1.000-1.030 Final  . pH 08/13/2015 6.0  5.0 - 8.0 Final  . Total Protein, Urine 08/13/2015 NEGATIVE  Negative Final  . Urine Glucose 08/13/2015 NEGATIVE  Negative Final  . Ketones, ur 08/13/2015 NEGATIVE  Negative Final  .  Bilirubin Urine 08/13/2015 NEGATIVE  Negative Final  . Hgb urine dipstick 08/13/2015 NEGATIVE  Negative Final  . Urobilinogen, UA 08/13/2015 0.2  0.0 - 1.0 Final  . Leukocytes, UA 08/13/2015 NEGATIVE  Negative Final  . Nitrite 08/13/2015 NEGATIVE   Negative Final  . WBC, UA 08/13/2015 0-2/hpf  0-2/hpf Final  . RBC / HPF 08/13/2015 0-2/hpf  0-2/hpf Final           Medication List       This list is accurate as of: 08/19/15 11:59 PM.  Always use your most recent med list.               aspirin 81 MG tablet  Take 81 mg by mouth daily.     atorvastatin 80 MG tablet  Commonly known as:  LIPITOR  Take 1 tablet (80 mg total) by mouth daily.     calcium carbonate 600 MG Tabs tablet  Commonly known as:  OS-CAL  Take 1,200 mg by mouth daily with breakfast.     colesevelam 625 MG tablet  Commonly known as:  WELCHOL  Take 3 tablets twice a day     ferrous sulfate 325 (65 FE) MG tablet  Take 1 tablet (325 mg total) by mouth daily with breakfast.     glucose blood test strip  Commonly known as:  ACCU-CHEK AVIVA PLUS  USE AS DIRECTED 3 TIMES A DAY Dx code E11.65     HUMALOG MIX 50/50 KWIKPEN (50-50) 100 UNIT/ML Kwikpen  Generic drug:  Insulin Lispro Prot & Lispro  INJECT 45 UNITS INTO THE SKIN 3 TIMES DAILY BEFORE MEALS     insulin glargine 100 UNIT/ML injection  Commonly known as:  LANTUS  Inject 0.46 mLs (46 Units total) into the skin daily.     insulin lispro 100 UNIT/ML KiwkPen  Commonly known as:  HUMALOG KWIKPEN  Inject 14 units in the am, 40 units with lunch and 14 units with dinner.     isosorbide mononitrate 30 MG 24 hr tablet  Commonly known as:  IMDUR  Take 1 tablet (30 mg total) by mouth daily.     KLOR-CON 10 10 MEQ tablet  Generic drug:  potassium chloride  Take 10 mEq by mouth daily.     memantine 10 MG tablet  Commonly known as:  NAMENDA  Take 10 mg by mouth daily.     metFORMIN 500 MG 24 hr tablet  Commonly known as:  GLUCOPHAGE-XR  Take 3 tablets (1,500 mg total) by mouth daily with supper.     metoprolol tartrate 12.5 mg Tabs tablet  Commonly known as:  LOPRESSOR  Take 0.5 tablets (12.5 mg total) by mouth 2 (two) times daily.     NESINA 25 MG Tabs  Generic drug:  Alogliptin Benzoate    Take 25 mg by mouth.     nitroGLYCERIN 0.4 MG SL tablet  Commonly known as:  NITROSTAT  Place 1 tablet (0.4 mg total) under the tongue every 5 (five) minutes x 3 doses as needed for chest pain.     NOVOLOG FLEXPEN 100 UNIT/ML FlexPen  Generic drug:  insulin aspart  Inject 5 Units into the skin 2 (two) times daily.     ticagrelor 90 MG Tabs tablet  Commonly known as:  BRILINTA  Take 1 tablet (90 mg total) by mouth 2 (two) times daily.     valsartan 160 MG tablet  Commonly known as:  DIOVAN  TAKE 1 TABLET (160 MG TOTAL) BY MOUTH DAILY.  VICKS VAPORUB EX  Apply 1 application topically daily as needed.        Allergies: No Known Allergies  Past Medical History  Diagnosis Date  . Hypertension   . Arthritis   . Dyslipidemia   . Coronary artery disease   . Diabetes mellitus     insulin dependent    Past Surgical History  Procedure Laterality Date  . Open heart surgery  1998  . Coronary artery bypass graft    . Cardiac catheterization    . Left heart catheterization with coronary angiogram N/A 09/15/2013    Procedure: LEFT HEART CATHETERIZATION WITH CORONARY ANGIOGRAM;  Surgeon: Clent Demark, MD;  Location: [facility] CATH LAB;  Service: Cardiovascular;  Laterality: N/A;  . Percutaneous stent intervention  09/15/2013    Procedure: PERCUTANEOUS STENT INTERVENTION;  Surgeon: Clent Demark, MD;  Location: [facility] CATH LAB;  Service: Cardiovascular;;    No family history on file.  Social History:  reports that he has never smoked. He has never used smokeless tobacco. He reports that he does not drink alcohol or use illicit drugs.  Review of Systems - Cardiovascular ROS: positive for - coronary artery disease, cerebrovascular disease   HYPERTENSION:  he has previously been on multiple drugs including labetalol and Diovan but not clear if he is taking any medications Appears to be only on metoprolol   No change in renal function   HYPERLIPIDEMIA:         The lipid  abnormality consists of elevated LDL and this has been generally difficult to control with various drugs in the past probably because of noncompliance with medications and financial reasons.   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   He has had significant anemia previously   Examination:   BP 124/72 mmHg  Pulse 71  Temp(Src) 98.6 F (37 C)  Resp 14  Ht 5\' 8"  (1.727 m)  Wt 173 lb 6.4 oz (78.654 kg)  BMI 26.37 kg/m2  SpO2 96%  Body mass index is 26.37 kg/(m^2).   No pedal edema  Assesment/PLAN:   1. Diabetes type 2, uncontrolled  The patient's diabetes control appears still inadequate with home blood sugar averaging 170 See history of present illness for details of his current management He does appear to need larger doses of mealtime coverage at most of his meals Need better assessment of postprandial readings after breakfast and supper Also since his fasting readings are recently higher will need more basal insulin Discussed blood sugar targets both before and after meals  Recommendations made today:  Start monitoring a few readings after breakfast and lunch  Insulin doses to be as follows:Lantus 46 units am. NOVOLOG 14 AM; 28 LUNCH- 14 at dinner     Encouraged him to start some exercise  Balanced meals with enough protein at each meal   2. HYPERCHOLESTEROLEMIA: He has been on generic Lipitor and lipids are much better now from improve compliance. Liver functions are normal He thinks he is also compliant  with his  WelChol  Hypertension: Currently on no medications and blood pressure is high normal  Consider restarting low dose Diovan because of diabetes diabetes Need to reassess urine microalbumin  Counseling time over  50% of today's 25 minute visit   Inika Bellanger 08/20/2015, 12:44 PM      Patient ID: Calvin Perez, male   DOB: December 18, 1940, 74 y.o.   MRN: [id number]   Reason for Appointment: Diabetes follow-up   History of Present Illness  Diagnosis: Type 2 DIABETES MELITUS, since 1984       Past history:  He has been on insulin regimen since about 2004. He has required large doses of insulin insulin persistently but with inadequate control usually Had been relatively intolerant to metformin and did not want to pay for Actos His hemoglobin A1c in the past has usually been about 8-9%  He usually has problems with  postprandial hyperglycemia despite taking large dose of mealtime insulin  RECENT history: His overall control has been persistently poor and A1c was still over 9% in 12/14.  Because of low sugars in the mornings on his last visit his Lantus was changed to the morning and the dose reduced from 50 down to 40 units He has not monitored his postprandial  readings consistently and only some readings after lunch until Friday of last week Most of his readings in the afternoons are high and he does not know why some fasting readings are as high as 300 His wife thinks that he is fairly compliant with his insulin Previously has been relatively noncompliant with followup, insulin regimen and glucose monitoring also  Oral hypoglycemic drugs: None at present        Side effects from medications: ? GI side effects from metformin Insulin regimen: Lantus 40  units am. NOVOLOG 07-26-09    Monitors blood glucose: Once a day.    Glucometer: One Touch.          Blood Glucose readings:  PREMEAL Breakfast Lunch Dinner Bedtime Overall  Glucose range:  84-302    134-238     Mean/median:  164     221    172    POST-MEAL PC Breakfast PC Lunch PC Dinner  Glucose range:   74-288    Mean/median:   186     Hypoglycemia frequency: Minimal, low normal reading of 73 at 4 AM once            Meals: 3 meals per day.     he is generally eating vegetarian meals but his wife says he is getting an egg for breakfast and some chicken at dinner      Physical activity: exercise: Minimal        Complications: are: Atherosclerotic vascular disease, recent stroke     Lab Results  Component Value Date   HGBA1C 8.2* 07/05/2015   HGBA1C 9.7* 03/29/2015   HGBA1C 9.0* 10/22/2014   Lab Results  Component Value Date   MICROALBUR 1.3 08/13/2015   LDLCALC 74 08/13/2015   CREATININE 1.15 08/13/2015    HYPERTENSION:  he has previously been on multiple drugs including labetalol and Diovan but not clear if he is taking any medications Appears to be only on metoprolol with fairly good blood pressure today No change in renal function     Medication List       This list is accurate as of: 08/19/15 11:59 PM.  Always use your most recent med list.  aspirin 81 MG tablet  Take 81 mg by mouth daily.     atorvastatin 80 MG tablet  Commonly known as:  LIPITOR  Take 1 tablet (80 mg total) by mouth daily.     calcium carbonate 600 MG Tabs tablet  Commonly known as:  OS-CAL  Take 1,200 mg by mouth daily with breakfast.     colesevelam 625 MG tablet  Commonly known as:  WELCHOL  Take 3 tablets twice a day     ferrous sulfate 325 (65 FE) MG tablet  Take 1 tablet (325 mg total) by mouth daily with breakfast.     glucose blood test strip  Commonly known as:  ACCU-CHEK AVIVA PLUS  USE AS DIRECTED 3 TIMES A DAY Dx code E11.65     HUMALOG MIX 50/50 KWIKPEN (50-50) 100 UNIT/ML Kwikpen  Generic drug:  Insulin Lispro Prot & Lispro  INJECT 45 UNITS INTO THE SKIN 3 TIMES DAILY BEFORE MEALS     insulin glargine 100 UNIT/ML injection  Commonly known as:  LANTUS  Inject 0.46 mLs (46 Units  total) into the skin daily.     insulin lispro 100 UNIT/ML KiwkPen  Commonly known as:  HUMALOG KWIKPEN  Inject 14 units in the am, 40 units with lunch and 14 units with dinner.     isosorbide mononitrate 30 MG 24 hr tablet  Commonly known as:  IMDUR  Take 1 tablet (30 mg total) by mouth daily.     KLOR-CON 10 10 MEQ tablet  Generic drug:  potassium chloride  Take 10 mEq by mouth daily.     memantine 10 MG tablet  Commonly known as:  NAMENDA  Take 10 mg by mouth daily.     metFORMIN 500 MG 24 hr tablet  Commonly known as:  GLUCOPHAGE-XR  Take 3 tablets (1,500 mg total) by mouth daily with supper.     metoprolol tartrate 12.5 mg Tabs tablet  Commonly known as:  LOPRESSOR  Take 0.5 tablets (12.5 mg total) by mouth 2 (two) times daily.     NESINA 25 MG Tabs  Generic drug:  Alogliptin Benzoate  Take 25 mg by mouth.     nitroGLYCERIN 0.4 MG SL tablet  Commonly known as:  NITROSTAT  Place 1 tablet (0.4 mg total) under the tongue every 5 (five) minutes x 3 doses as needed for chest pain.     NOVOLOG FLEXPEN 100 UNIT/ML FlexPen  Generic drug:  insulin aspart  Inject 5 Units into the skin 2 (two) times daily.     ticagrelor 90 MG Tabs tablet  Commonly known as:  BRILINTA  Take 1 tablet (90 mg total) by mouth 2 (two) times daily.     valsartan 160 MG tablet  Commonly known as:  DIOVAN  TAKE 1 TABLET (160 MG TOTAL) BY MOUTH DAILY.     VICKS VAPORUB EX  Apply 1 application topically daily as needed.        Allergies: No Known Allergies  Past Medical History  Diagnosis Date  . Hypertension   . Arthritis   . Dyslipidemia   . Coronary artery disease   . Diabetes mellitus     insulin dependent    Past Surgical History  Procedure Laterality Date  . Open heart surgery  1998  . Coronary artery bypass graft    . Cardiac catheterization    . Left heart catheterization with coronary angiogram N/A 09/15/2013    Procedure: LEFT HEART CATHETERIZATION WITH CORONARY  ANGIOGRAM;  Surgeon:  Clent Demark, MD;  Location: [facility] CATH LAB;  Service: Cardiovascular;  Laterality: N/A;  . Percutaneous stent intervention  09/15/2013    Procedure: PERCUTANEOUS STENT INTERVENTION;  Surgeon: Clent Demark, MD;  Location: [facility] CATH LAB;  Service: Cardiovascular;;    No family history on file.  Social History:  reports that he has never smoked. He has never used smokeless tobacco. He reports that he does not drink alcohol or use illicit drugs.  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   Still having some memory issues   Diabetic foot exam was normal in 01/2015    Examination:   BP 124/72 mmHg  Pulse 71  Temp(Src) 98.6 F (37 C)  Resp 14  Ht 5\' 8"  (1.727 m)  Wt 173 lb 6.4 oz (78.654 kg)  BMI 26.37 kg/m2  SpO2 96%  Body mass index is 26.37 kg/(m^2).    No ankle edema   Assesment/PLAN:   Diabetes type 2, uncontrolled See history of present illness for detailed discussion of his current management, blood sugar patterns and problems identified  He appears to be significantly better controlled with adding Nesina and also metformin ER His blood sugars can be better still if he is very compliant with his insulin and metformin and he is somewhat irregular with taking this and he thinks metformin makes her sugar drop low Fasting blood sugars are variable as a result  Also  since he does not check his blood sugars after supper not clear if he has good readings after his evening meal Fructosamine is mildly increased at 311 still  Since his fasting readings are better with taking metformin will have him continue metformin consistently twice a day Also to improve his overnight readings and cause less sugars below normal in the afternoon he can change his Lantus to the evening now He will reduce his Humalog by 4 units at lunch More readings after supper Instructions discussed in detail and also reviewed with his daughter  HYPERCHOLESTEROLEMIA: He needs to take WelChol consistently Consider switching to Crestor as this is Generic now  Hypertension: Consistently controlled on present regimen  Counseling time on subjects discussed above is over 50% of today's 25 minute visit   Patient Instructions  Take Metformin 2x daily and not skip  Reduce Lantus to 42 with taking metformin 2x daily and take at dinner with Humalog daily  Humalog 26 at lunch daily  Check blood sugars on waking up 2-3  times a week Also check blood sugars about 2 hours after a meal and do this after different meals by rotation  Recommended blood sugar levels on waking up is 90-130 and about 2 hours after meal is 130-160  Please bring your blood sugar monitor to each visit, thank you       [facility] 08/20/2015, 12:44 PM

## 2015-08-19 NOTE — Patient Instructions (Signed)
Take Metformin 2x daily and not skip  Reduce Lantus to 42 with taking metformin 2x daily and take at dinner with Humalog daily  Humalog 26 at lunch daily  Check blood sugars on waking up 2-3  times a week Also check blood sugars about 2 hours after a meal and do this after different meals by rotation  Recommended blood sugar levels on waking up is 90-130 and about 2 hours after meal is 130-160  Please bring your blood sugar monitor to each visit, thank you

## 2015-10-14 ENCOUNTER — Other Ambulatory Visit: Payer: Self-pay

## 2015-10-19 ENCOUNTER — Encounter: Payer: Self-pay | Admitting: Endocrinology

## 2015-10-19 ENCOUNTER — Ambulatory Visit (INDEPENDENT_AMBULATORY_CARE_PROVIDER_SITE_OTHER): Payer: Medicare HMO | Admitting: Endocrinology

## 2015-10-19 VITALS — BP 126/68 | HR 91 | Temp 98.2°F | Resp 14 | Ht 68.0 in | Wt 172.6 lb

## 2015-10-19 DIAGNOSIS — E1165 Type 2 diabetes mellitus with hyperglycemia: Secondary | ICD-10-CM | POA: Diagnosis not present

## 2015-10-19 DIAGNOSIS — Z794 Long term (current) use of insulin: Secondary | ICD-10-CM

## 2015-10-19 DIAGNOSIS — E1169 Type 2 diabetes mellitus with other specified complication: Secondary | ICD-10-CM

## 2015-10-19 LAB — COMPREHENSIVE METABOLIC PANEL
ALT: 19 U/L (ref 0–53)
AST: 17 U/L (ref 0–37)
Albumin: 4.1 g/dL (ref 3.5–5.2)
Alkaline Phosphatase: 74 U/L (ref 39–117)
BILIRUBIN TOTAL: 0.5 mg/dL (ref 0.2–1.2)
BUN: 16 mg/dL (ref 6–23)
CALCIUM: 9.5 mg/dL (ref 8.4–10.5)
CO2: 28 meq/L (ref 19–32)
CREATININE: 1.18 mg/dL (ref 0.40–1.50)
Chloride: 101 mEq/L (ref 96–112)
GFR: 63.78 mL/min (ref >60.00)
Glucose, Bld: 180 mg/dL — ABNORMAL HIGH (ref 70–99)
Potassium: 4.4 mEq/L (ref 3.5–5.1)
Sodium: 136 mEq/L (ref 135–145)
TOTAL PROTEIN: 7.6 g/dL (ref 6.0–8.3)

## 2015-10-19 LAB — POCT GLYCOSYLATED HEMOGLOBIN (HGB A1C): Hemoglobin A1C: 8

## 2015-10-19 NOTE — Patient Instructions (Signed)
Check blood sugars on waking up 2 times a week Also check blood sugars about 2 hours after a meal and do this after different meals by rotation  Recommended blood sugar levels on waking up is 90-130 and about 2 hours after meal is 130-160  Please bring your blood sugar monitor to each visit, thank you  Increase Humalog to 35 at lunch and 20 at dinner   Take metformin 2x daily

## 2015-10-19 NOTE — Progress Notes (Signed)
Patient ID: Calvin Perez, male   DOB: 01/27/1941, 75 y.o.   MRN: [id number]   Reason for Appointment: Diabetes follow-up   History of Present Illness  Diagnosis: Type 2 DIABETES MELITUS, since 1984       Past history:  He has been on insulin regimen since about 2004. He has required large doses of insulin insulin persistently but with inadequate control usually Had been relatively intolerant to metformin and did not want to pay for Actos His hemoglobin A1c in the past has usually been about 8-9%  He usually has problems with  postprandial hyperglycemia despite taking large dose of mealtime insulin  RECENT history:  Insulin regimen: Lantus 45 units am.  Humalog 15-30-15 before meals  His overall control has been improving last year However recent blood sugars appear to be higher again and A1c is still high at 8%  He has apparently been taking his metformin and Nesina according to his wife as directed which appeared to be helping his control previously   Current blood sugar patterns and problems:  His fasting readings are variable and relatively higher in the last few days and averaging about 115  Despite instructions he is only checking some readings after meals and mostly after lunch and these appear to be significantly high at times  He is not motivated to exercise and does not go to the [facility] now as of transportation difficulties  Previously was getting more protein at breakfast with eggs but is not doing so now  Still having of the high carbohydrate meals at lunch and dinner  Not clear if his sugars are higher after dinner and getting over to the next day as he does not check readings after supper despite repeated reminders Meals: 3 meals per day. Lunch 1-2 pm, dinner 7-8 PM  Oral hypoglycemic drugs: Nesina, metformin 500 mg twice a day        Side effects from medications: ? GI side effects from metformin  Monitors blood glucose:  1-2 times a day.     Glucometer:  Accu-Chek         Blood Glucose readings recently:   Mean values apply above for all meters except median for One Touch  PRE-MEAL Fasting Lunch Dinner Bedtime Overall  Glucose range:  85-102    150-239     Mean/median:  155    220    174    He is generally eating vegetarian meals, he is getting lentils for breakfast and some chicken  at lunch       Physical activity: exercise:  recently irregular, not going to the [facility]           Complications: are: Atherosclerotic vascular disease, stroke     Wt Readings from Last 3 Encounters:  10/19/15 172 lb 9.6 oz (78.291 kg)  08/19/15 173 lb 6.4 oz (78.654 kg)  07/08/15 176 lb 6.4 oz (80.015 kg)   Lab Results  Component Value Date   HGBA1C 8.0 10/19/2015   HGBA1C 8.2* 07/05/2015   HGBA1C 9.7* 03/29/2015   Lab Results  Component Value Date   MICROALBUR 1.3 08/13/2015   LDLCALC 74 08/13/2015   CREATININE 1.15 08/13/2015   Office Visit on 10/19/2015  Component Date Value Ref Range Status  . Hemoglobin A1C 10/19/2015 8.0   Final           Medication List       This list is accurate as of: 10/19/15 10:55 AM.  Always use your  most recent med list.               aspirin 81 MG tablet  Take 81 mg by mouth daily.     atorvastatin 80 MG tablet  Commonly known as:  LIPITOR  Take 1 tablet (80 mg total) by mouth daily.     calcium carbonate 600 MG Tabs tablet  Commonly known as:  OS-CAL  Take 1,200 mg by mouth daily with breakfast.     colesevelam 625 MG tablet  Commonly known as:  WELCHOL  Take 3 tablets twice a day     glucose blood test strip  Commonly known as:  ACCU-CHEK AVIVA PLUS  USE AS DIRECTED 3 TIMES A DAY Dx code E11.65     insulin glargine 100 UNIT/ML injection  Commonly known as:  LANTUS  Inject 0.46 mLs (46 Units total) into the skin daily.     isosorbide mononitrate 30 MG 24 hr tablet  Commonly known as:  IMDUR  Take 1 tablet (30 mg total) by mouth daily.     KLOR-CON 10 10 MEQ tablet    Generic drug:  potassium chloride  Take 10 mEq by mouth daily.     memantine 10 MG tablet  Commonly known as:  NAMENDA  Take 10 mg by mouth daily.     metFORMIN 500 MG 24 hr tablet  Commonly known as:  GLUCOPHAGE-XR  Take 3 tablets (1,500 mg total) by mouth daily with supper.     metoprolol tartrate 12.5 mg Tabs tablet  Commonly known as:  LOPRESSOR  Take 0.5 tablets (12.5 mg total) by mouth 2 (two) times daily.     NESINA 25 MG Tabs  Generic drug:  Alogliptin Benzoate  Take 25 mg by mouth.     nitroGLYCERIN 0.4 MG SL tablet  Commonly known as:  NITROSTAT  Place 1 tablet (0.4 mg total) under the tongue every 5 (five) minutes x 3 doses as needed for chest pain.     NOVOLOG FLEXPEN 100 UNIT/ML FlexPen  Generic drug:  insulin aspart  Inject 5 Units into the skin 2 (two) times daily.     ticagrelor 90 MG Tabs tablet  Commonly known as:  BRILINTA  Take 1 tablet (90 mg total) by mouth 2 (two) times daily.     valsartan 160 MG tablet  Commonly known as:  DIOVAN  TAKE 1 TABLET (160 MG TOTAL) BY MOUTH DAILY.     VICKS VAPORUB EX  Apply 1 application topically daily as needed.        Allergies: No Known Allergies  Past Medical History  Diagnosis Date  . Hypertension   . Arthritis   . Dyslipidemia   . Coronary artery disease   . Diabetes mellitus     insulin dependent    Past Surgical History  Procedure Laterality Date  . Open heart surgery  1998  . Coronary artery bypass graft    . Cardiac catheterization    . Left heart catheterization with coronary angiogram N/A 09/15/2013    Procedure: LEFT HEART CATHETERIZATION WITH CORONARY ANGIOGRAM;  Surgeon: Clent Demark, MD;  Location: [facility] CATH LAB;  Service: Cardiovascular;  Laterality: N/A;  . Percutaneous stent intervention  09/15/2013    Procedure: PERCUTANEOUS STENT INTERVENTION;  Surgeon: Clent Demark, MD;  Location: [place] CATH LAB;  Service: Cardiovascular;;    No family history on file.  Social History:   reports that he has never smoked. He has never used smokeless tobacco. He reports  that he does not drink alcohol or use illicit drugs.  Review of Systems - Cardiovascular ROS: positive for - coronary artery disease, cerebrovascular disease   HYPERTENSION:  he has previously been on multiple drugs including labetalol and Diovan but not clear if he is taking any medications Appears to be only on metoprolol   No change in renal function   HYPERLIPIDEMIA:         The lipid abnormality consists of elevated LDL and this has been generally difficult to control with various drugs in the past probably because of noncompliance with medications and financial reasons.   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   He has had significant anemia previously   Examination:   BP 126/68 mmHg  Pulse 91  Temp(Src) 98.2 F (36.8 C)  Resp 14  Ht 5\' 8"  (1.727 m)  Wt 172 lb 9.6 oz (78.291 kg)  BMI 26.25 kg/m2  SpO2 97%  Body mass index is 26.25 kg/(m^2).   No pedal edema  Assesment/PLAN:   1. Diabetes type 2, uncontrolled  The patient's diabetes control appears still inadequate with home blood sugar averaging 170 See history of present illness for details of his current management He does appear to need larger doses of mealtime coverage at most of his meals Need better assessment of postprandial readings after breakfast and supper Also since his fasting readings are recently higher will need more basal insulin Discussed blood sugar targets both before and after  meals  Recommendations made today:  Start monitoring a few readings after breakfast and lunch  Insulin doses to be as follows:Lantus 46 units am. NOVOLOG 14 AM; 28 LUNCH- 14 at dinner     Encouraged him to start some exercise  Balanced meals with enough protein at each meal   2. HYPERCHOLESTEROLEMIA: He has been on generic Lipitor and lipids are much better now from improve compliance. Liver functions are normal He thinks he is also compliant with his WelChol  Hypertension: Currently on no medications and blood pressure is high normal  Consider restarting low dose Diovan because of diabetes diabetes Need to reassess urine microalbumin  Counseling time over  50% of today's 25 minute visit   Detria Cummings 10/19/2015, 10:55 AM      Patient ID: Calvin Perez, male   DOB: 10-17-1940, 75 y.o.   MRN: [id number]   Reason for Appointment: Diabetes follow-up   History of Present Illness  Diagnosis: Type 2 DIABETES MELITUS, since 1984       Past history:  He has been on insulin regimen since about 2004. He has required large doses of insulin insulin persistently but with inadequate control usually Had been relatively intolerant to metformin and did not want to pay for Actos His hemoglobin A1c in the past has usually been about 8-9%  He usually has problems with  postprandial hyperglycemia despite taking large dose of mealtime insulin  RECENT history: His overall control has been persistently poor and A1c was still over 9% in  12/14.  Because of low sugars in the mornings on his last visit his Lantus was changed to the morning and the dose reduced from 50 down to 40 units He has not monitored his postprandial  readings consistently and only some readings after lunch until Friday of last week Most of his readings in the afternoons are high and he does not know why some fasting readings are as high as 300 His wife thinks that he is fairly compliant with his insulin Previously has been  relatively noncompliant with followup, insulin regimen and glucose monitoring also  Oral hypoglycemic drugs: None at present        Side effects from medications: ? GI side effects from metformin Insulin regimen: Lantus 40  units am. NOVOLOG 07-26-09    Monitors blood glucose: Once a day.    Glucometer: One Touch.          Blood Glucose readings:  PREMEAL Breakfast Lunch Dinner Bedtime Overall  Glucose range:  84-302    134-238     Mean/median:  164    221    172    POST-MEAL PC Breakfast PC Lunch PC Dinner  Glucose range:   74-288    Mean/median:   186     Hypoglycemia frequency: Minimal, low normal reading of 73 at 4 AM once            Meals: 3 meals per day.     he is generally eating vegetarian meals but his wife says he is getting an egg for breakfast and some chicken at dinner      Physical activity: exercise: Minimal        Complications: are: Atherosclerotic vascular disease, recent stroke     Lab Results  Component Value Date   HGBA1C 8.0 10/19/2015   HGBA1C 8.2* 07/05/2015   HGBA1C 9.7* 03/29/2015   Lab Results  Component Value Date   MICROALBUR 1.3 08/13/2015   LDLCALC 74 08/13/2015   CREATININE 1.15 08/13/2015    HYPERTENSION:  he has previously been on multiple drugs including labetalol and Diovan but not clear if he is taking any medications Appears to be only on metoprolol with fairly good blood pressure today No change in renal function     Medication List       This list is accurate as of: 10/19/15 10:55 AM.  Always use your most recent med list.               aspirin 81 MG tablet  Take 81 mg by mouth daily.     atorvastatin 80 MG tablet  Commonly known as:  LIPITOR  Take 1 tablet (80 mg total) by mouth daily.     calcium carbonate 600 MG Tabs tablet  Commonly known as:  OS-CAL  Take 1,200 mg by mouth daily with breakfast.     colesevelam 625 MG tablet  Commonly known as:  WELCHOL  Take 3 tablets twice a day     glucose blood test strip   Commonly known as:  ACCU-CHEK AVIVA PLUS  USE AS DIRECTED 3 TIMES A DAY Dx code E11.65     insulin glargine 100 UNIT/ML injection  Commonly known as:  LANTUS  Inject 0.46 mLs (46 Units total) into the skin daily.     isosorbide mononitrate 30 MG 24 hr tablet  Commonly known as:  IMDUR  Take 1 tablet (30 mg total) by mouth daily.     KLOR-CON 10 10 MEQ tablet  Generic drug:  potassium chloride  Take 10 mEq by mouth daily.     memantine 10 MG tablet  Commonly known as:  NAMENDA  Take 10 mg by mouth daily.     metFORMIN 500 MG 24 hr tablet  Commonly known as:  GLUCOPHAGE-XR  Take 3 tablets (1,500 mg total) by mouth daily with supper.     metoprolol tartrate 12.5 mg Tabs tablet  Commonly known as:  LOPRESSOR  Take 0.5 tablets (12.5 mg total) by mouth 2 (two) times daily.     NESINA 25 MG Tabs  Generic drug:  Alogliptin Benzoate  Take 25 mg by mouth.     nitroGLYCERIN 0.4 MG SL tablet  Commonly known as:  NITROSTAT  Place 1 tablet (0.4 mg total) under the tongue every 5 (five) minutes x 3 doses as needed for chest pain.     NOVOLOG FLEXPEN 100 UNIT/ML FlexPen  Generic drug:  insulin aspart  Inject 5 Units into the skin 2 (two) times daily.     ticagrelor 90 MG Tabs tablet  Commonly known as:  BRILINTA  Take 1 tablet (90 mg total) by mouth 2 (two) times daily.     valsartan 160 MG tablet  Commonly known as:  DIOVAN  TAKE 1 TABLET (160 MG TOTAL) BY MOUTH DAILY.     VICKS VAPORUB EX  Apply 1 application topically daily as needed.        Allergies: No Known Allergies  Past Medical History  Diagnosis Date  . Hypertension   . Arthritis   . Dyslipidemia   . Coronary artery disease   . Diabetes mellitus     insulin dependent    Past Surgical History  Procedure Laterality Date  . Open heart surgery  1998  . Coronary artery bypass graft    . Cardiac catheterization    . Left heart catheterization with coronary angiogram N/A 09/15/2013    Procedure: LEFT HEART  CATHETERIZATION WITH CORONARY ANGIOGRAM;  Surgeon: Clent Demark, MD;  Location: [facility] CATH LAB;  Service: Cardiovascular;  Laterality: N/A;  . Percutaneous stent intervention  09/15/2013    Procedure: PERCUTANEOUS STENT INTERVENTION;  Surgeon: Clent Demark, MD;  Location: [place] CATH LAB;  Service: Cardiovascular;;    No family history on file.  Social History:  reports that he has never smoked. He has never used smokeless tobacco. He reports that he does not drink alcohol or use illicit drugs.  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  May have mild difficulty walking at times   Still having some memory issues and is on Namenda  Currently not taking iron, previously had history of anemia and not clear if this has been followed by PCP  Diabetic foot exam was normal in 01/2015    Examination:   BP 126/68 mmHg  Pulse 91  Temp(Src) 98.2 F (36.8 C)  Resp 14  Ht 5\' 8"  (1.727 m)  Wt 172 lb 9.6 oz (78.291 kg)  BMI 26.25 kg/m2  SpO2 97%  Body mass index is 26.25 kg/(m^2).     Assesment/PLAN:   Diabetes type 2, uncontrolled See history of present illness for detailed discussion of his current management, blood sugar patterns and problems identified His blood sugars are starting to go up now and most likely related to not exercising and somewhat inconsistent protein intake at meals Highest blood sugars are after lunch when he monitors them Not clear if blood sugars are high after breakfast or dinner A1c is still high at  8%  Recommendations: Discussed timing and targets of blood sugars at various times Will empirically increase his Humalog by 5 units at lunch and dinner for now Add protein to each meal consistently Start exercising again No change in oral hypoglycemics which he is tolerating  HYPERCHOLESTEROLEMIA: Will recheck labs on the next visit, emphasized compliance with both Lipitor and WelChol  Hypertension: Consistently controlled on present regimen  Counseling time on subjects discussed above is over 50% of today's 25 minute visit   Patient Instructions  Check blood sugars on waking up 2 times a week Also check blood sugars about 2 hours after a meal and do this after different meals by rotation  Recommended blood sugar levels on waking up is 90-130 and about 2 hours after meal is 130-160  Please bring your blood sugar monitor to each visit, thank you  Increase Humalog to 35 at lunch and 20 at dinner   Take metformin 2x daily        Aanchal Cope 10/19/2015, 10:55 AM

## 2015-11-09 ENCOUNTER — Other Ambulatory Visit: Payer: Self-pay | Admitting: Endocrinology

## 2015-12-01 ENCOUNTER — Other Ambulatory Visit: Payer: Self-pay | Admitting: Endocrinology

## 2016-01-25 ENCOUNTER — Other Ambulatory Visit: Payer: Self-pay | Admitting: Endocrinology

## 2016-02-04 ENCOUNTER — Other Ambulatory Visit (INDEPENDENT_AMBULATORY_CARE_PROVIDER_SITE_OTHER): Payer: Medicare HMO

## 2016-02-04 DIAGNOSIS — Z794 Long term (current) use of insulin: Secondary | ICD-10-CM | POA: Diagnosis not present

## 2016-02-04 DIAGNOSIS — E1165 Type 2 diabetes mellitus with hyperglycemia: Secondary | ICD-10-CM | POA: Diagnosis not present

## 2016-02-04 LAB — LIPID PANEL
CHOL/HDL RATIO: 4
CHOLESTEROL: 119 mg/dL (ref 0–200)
HDL: 30.8 mg/dL — ABNORMAL LOW (ref >39.00)
LDL CALC: 69 mg/dL (ref 0–99)
NonHDL: 88.63
Triglycerides: 96 mg/dL (ref 0.0–149.0)
VLDL: 19.2 mg/dL (ref 0.0–40.0)

## 2016-02-04 LAB — COMPREHENSIVE METABOLIC PANEL
ALBUMIN: 4.2 g/dL (ref 3.5–5.2)
ALT: 22 U/L (ref 0–53)
AST: 22 U/L (ref 0–37)
Alkaline Phosphatase: 62 U/L (ref 39–117)
BUN: 13 mg/dL (ref 6–23)
CHLORIDE: 104 meq/L (ref 96–112)
CO2: 27 meq/L (ref 19–32)
Calcium: 10 mg/dL (ref 8.4–10.5)
Creatinine, Ser: 1.04 mg/dL (ref 0.40–1.50)
GFR: 73.73 mL/min (ref >60.00)
Glucose, Bld: 128 mg/dL — ABNORMAL HIGH (ref 70–99)
POTASSIUM: 5 meq/L (ref 3.5–5.1)
SODIUM: 137 meq/L (ref 135–145)
Total Bilirubin: 0.6 mg/dL (ref 0.2–1.2)
Total Protein: 7.6 g/dL (ref 6.0–8.3)

## 2016-02-04 LAB — HEMOGLOBIN A1C: HEMOGLOBIN A1C: 8.2 % — AB (ref 4.6–6.5)

## 2016-02-09 ENCOUNTER — Encounter: Payer: Self-pay | Admitting: Endocrinology

## 2016-02-09 ENCOUNTER — Ambulatory Visit (INDEPENDENT_AMBULATORY_CARE_PROVIDER_SITE_OTHER): Payer: Medicare HMO | Admitting: Endocrinology

## 2016-02-09 VITALS — BP 136/82 | HR 84 | Temp 97.9°F | Resp 14 | Ht 68.0 in | Wt 172.0 lb

## 2016-02-09 DIAGNOSIS — E1165 Type 2 diabetes mellitus with hyperglycemia: Secondary | ICD-10-CM | POA: Diagnosis not present

## 2016-02-09 DIAGNOSIS — I10 Essential (primary) hypertension: Secondary | ICD-10-CM

## 2016-02-09 DIAGNOSIS — E7800 Pure hypercholesterolemia, unspecified: Secondary | ICD-10-CM

## 2016-02-09 DIAGNOSIS — Z794 Long term (current) use of insulin: Secondary | ICD-10-CM

## 2016-02-09 NOTE — Progress Notes (Signed)
Patient ID: Calvin Perez, male   DOB: May 17, 1941, 75 y.o.   MRN: [id number]   Reason for Appointment: Diabetes follow-up   History of Present Illness  Diagnosis: Type 2 DIABETES MELITUS, since 1984       Past history:  He has been on insulin regimen since about 2004. He has required large doses of insulin insulin persistently but with inadequate control usually Had been relatively intolerant to metformin and did not want to pay for Actos His hemoglobin A1c in the past has usually been about 8-9%  He usually has problems with  postprandial hyperglycemia despite taking large dose of mealtime insulin  RECENT history:  Insulin regimen: Lantus 45 units am.  Humalog 0-30-25 before meals  His A1c is still high at 8.2, previously 8%  He has has not had any issues with affording his insulin now   Current blood sugar patterns and problems:  His fasting readings are generally fairly good but somewhat variable  Since he is checking his blood sugars mostly in the morning and only sometimes in the afternoons between lunch and supper time this is not indicative of his overall control  HUMALOG is not being taken in the morning as he thinks he is taking Lantus and this is adequate  Does not check sugars after breakfast or supper  He tries to exercise fairly regularly more recently up to one hour  This may be helping his afternoon readings which are better than before   He still takes the seeing the [place] which apparently had been helping his control previously  He claims that if he takes 1500 mg of metformin he will start getting low sugars at various times but he does not adjust his insulin accordingly  Hypoglycemia as documented only once around 5 PM with glucose 63  Occasionally has significantly high readings either morning or afternoon over 200  He still has a relatively low protein diet with only bread and vegetables and some lentils  Meals: 3 meals per day. Lunch  1-2 pm, dinner 7-8 PM  Oral hypoglycemic drugs: Nesina, metformin 500 mg twice a day         Side effects from medications: ? GI side effects from metformin  Monitors blood glucose:  1-2 times a day.    Glucometer:  Accu-Chek         Blood Glucose readings recently:   Mean values apply above for all meters except median for One Touch  PRE-MEAL Fasting Lunch Dinner Bedtime Overall  Glucose range: 95-184     63-311   Mean/median: 148   135   149    POST-MEAL PC Breakfast PC Lunch PC Dinner  Glucose range:     Mean/median:  165      He is generally eating vegetarian meals, he is getting lentils for breakfast and some chicken  at lunch       Physical activity: exercise:  recently irregular, not going to the [facility]           Complications: are: Atherosclerotic vascular disease, stroke     Wt Readings from Last 3 Encounters:  02/09/16 172 lb (78.019 kg)  10/19/15 172 lb 9.6 oz (78.291 kg)  08/19/15 173 lb 6.4 oz (78.654 kg)   Lab Results  Component Value Date   HGBA1C 8.2* 02/04/2016   HGBA1C 8.0 10/19/2015   HGBA1C 8.2* 07/05/2015   Lab Results  Component Value Date   MICROALBUR 1.3 08/13/2015   [place] 69 02/04/2016  CREATININE 1.04 02/04/2016   No visits with results within 1 Day(s) from this visit. Latest known visit with results is:  Lab on 02/04/2016  Component Date Value Ref Range Status  . Hgb A1c MFr Bld 02/04/2016 8.2* 4.6 - 6.5 % Final   Glycemic Control Guidelines for People with Diabetes:Non Diabetic:  <6%Goal of Therapy: <7%Additional Action Suggested:  >8%   . Sodium 02/04/2016 137  135 - 145 mEq/L Final  . Potassium 02/04/2016 5.0  3.5 - 5.1 mEq/L Final  . Chloride 02/04/2016 104  96 - 112 mEq/L Final  . CO2 02/04/2016 27  19 - 32 mEq/L Final  . Glucose, Bld 02/04/2016 128* 70 - 99 mg/dL Final  . BUN 02/04/2016 13  6 - 23 mg/dL Final  . Creatinine, Ser 02/04/2016 1.04  0.40 - 1.50 mg/dL Final  . Total Bilirubin 02/04/2016 0.6  0.2 - 1.2 mg/dL Final  .  Alkaline Phosphatase 02/04/2016 62  39 - 117 U/L Final  . AST 02/04/2016 22  0 - 37 U/L Final  . ALT 02/04/2016 22  0 - 53 U/L Final  . Total Protein 02/04/2016 7.6  6.0 - 8.3 g/dL Final  . Albumin 02/04/2016 4.2  3.5 - 5.2 g/dL Final  . Calcium 02/04/2016 10.0  8.4 - 10.5 mg/dL Final  . GFR 02/04/2016 73.73  >60.00 mL/min Final  . Cholesterol 02/04/2016 119  0 - 200 mg/dL Final   ATP III Classification       Desirable:  < 200 mg/dL               Borderline High:  200 - 239 mg/dL          High:  > = 240 mg/dL  . Triglycerides 02/04/2016 96.0  0.0 - 149.0 mg/dL Final   Normal:  <150 mg/dLBorderline High:  150 - 199 mg/dL  . HDL 02/04/2016 30.80* >39.00 mg/dL Final  . VLDL 02/04/2016 19.2  0.0 - 40.0 mg/dL Final  . LDL Cholesterol 02/04/2016 69  0 - 99 mg/dL Final  . Total CHOL/HDL Ratio 02/04/2016 4   Final                  Men          Women1/2 Average Risk     3.4          3.3Average Risk          5.0          4.42X Average Risk          9.6          7.13X Average Risk          15.0          11.0                      . NonHDL 02/04/2016 88.63   Final   NOTE:  Non-HDL goal should be 30 mg/dL higher than patient's LDL goal (i.e. LDL goal of < 70 mg/dL, would have non-HDL goal of < 100 mg/dL)           Medication List       This list is accurate as of: 02/09/16 11:59 PM.  Always use your most recent med list.               aspirin 81 MG tablet  Take 81 mg by mouth daily.     atorvastatin 80 MG tablet  Commonly known as:  LIPITOR  Take 1 tablet (80 mg total) by mouth daily.     calcium carbonate 600 MG Tabs tablet  Commonly known as:  OS-CAL  Take 1,200 mg by mouth daily with breakfast.     colesevelam 625 MG tablet  Commonly known as:  WELCHOL  Take 3 tablets twice a day     glucose blood test strip  Commonly known as:  ACCU-CHEK AVIVA PLUS  USE AS DIRECTED 3 TIMES A DAY Dx code E11.65     insulin glargine 100 UNIT/ML injection  Commonly known as:  LANTUS  Inject  0.46 mLs (46 Units total) into the skin daily.     isosorbide mononitrate 30 MG 24 hr tablet  Commonly known as:  IMDUR  Take 1 tablet (30 mg total) by mouth daily.     KLOR-CON 10 10 MEQ tablet  Generic drug:  potassium chloride  Take 10 mEq by mouth daily.     memantine 10 MG tablet  Commonly known as:  NAMENDA  Take 10 mg by mouth daily.     metFORMIN 500 MG 24 hr tablet  Commonly known as:  GLUCOPHAGE-XR  TAKE 3 TABLETS BY MOUTH ONCE DAILY WITH SUPPER     metoprolol tartrate 12.5 mg Tabs tablet  Commonly known as:  LOPRESSOR  Take 0.5 tablets (12.5 mg total) by mouth 2 (two) times daily.     NESINA 25 MG Tabs  Generic drug:  Alogliptin Benzoate  Take 25 mg by mouth.     nitroGLYCERIN 0.4 MG SL tablet  Commonly known as:  NITROSTAT  Place 1 tablet (0.4 mg total) under the tongue every 5 (five) minutes x 3 doses as needed for chest pain.     NOVOLOG FLEXPEN 100 UNIT/ML FlexPen  Generic drug:  insulin aspart  Inject 5 Units into the skin 2 (two) times daily.     ticagrelor 90 MG Tabs tablet  Commonly known as:  BRILINTA  Take 1 tablet (90 mg total) by mouth 2 (two) times daily.     valsartan 160 MG tablet  Commonly known as:  DIOVAN  TAKE 1 TABLET (160 MG TOTAL) BY MOUTH DAILY.     VICKS VAPORUB EX  Apply 1 application topically daily as needed.        Allergies: No Known Allergies  Past Medical History  Diagnosis Date  . Hypertension   . Arthritis   . Dyslipidemia   . Coronary artery disease   . Diabetes mellitus     insulin dependent    Past Surgical History  Procedure Laterality Date  . Open heart surgery  1998  . Coronary artery bypass graft    . Cardiac catheterization    . Left heart catheterization with coronary angiogram N/A 09/15/2013    Procedure: LEFT HEART CATHETERIZATION WITH CORONARY ANGIOGRAM;  Surgeon: Clent Demark, MD;  Location: [facility] CATH LAB;  Service: Cardiovascular;  Laterality: N/A;  . Percutaneous stent intervention   09/15/2013    Procedure: PERCUTANEOUS STENT INTERVENTION;  Surgeon: Clent Demark, MD;  Location: [place] CATH LAB;  Service: Cardiovascular;;    No family history on file.  Social History:  reports that he has never smoked. He has never used smokeless tobacco. He reports that he does not drink alcohol or use illicit drugs.  Review of Systems - Cardiovascular ROS: positive for - coronary artery disease, cerebrovascular disease   HYPERTENSION:  he has previously been on multiple drugs including labetalol and Diovan but not clear if he  is taking any medications Appears to be only on metoprolol   No change in renal function   HYPERLIPIDEMIA:         The lipid abnormality consists of elevated LDL and this has been generally difficult to control with various drugs in the past probably because of noncompliance with medications and financial reasons.   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   He has had significant anemia previously   Examination:   BP 136/82 mmHg  Pulse 84  Temp(Src) 97.9 F (36.6 C)  Resp 14  Ht 5\' 8"  (1.727 m)  Wt 172 lb (78.019 kg)  BMI 26.16 kg/m2  SpO2 98%  Body mass index is 26.16 kg/(m^2).   No pedal edema  Assesment/PLAN:   1. Diabetes type 2, uncontrolled  The patient's diabetes control appears still inadequate with home blood sugar averaging 170 See history of present illness for details of his current management He does appear to need larger doses of mealtime coverage at most of his meals Need better assessment  of postprandial readings after breakfast and supper Also since his fasting readings are recently higher will need more basal insulin Discussed blood sugar targets both before and after meals  Recommendations made today:  Start monitoring a few readings after breakfast and lunch  Insulin doses to be as follows:Lantus 46 units am. NOVOLOG 14 AM; 28 LUNCH- 14 at dinner     Encouraged him to start some exercise  Balanced meals with enough protein at each meal   2. HYPERCHOLESTEROLEMIA: He has been on generic Lipitor and lipids are much better now from improve compliance. Liver functions are normal He thinks he is also compliant with his WelChol  Hypertension: Currently on no medications and blood pressure is high normal  Consider restarting low dose Diovan because of diabetes diabetes Need to reassess urine microalbumin  Counseling time over  50% of today's 25 minute visit   Mahlet Jergens 02/10/2016, 9:20 AM      Patient ID: Calvin Perez, male   DOB: [birth year], 75 y.o.   MRN: [id number]   Reason for Appointment: Diabetes follow-up   History of Present Illness  Diagnosis: Type 2 DIABETES MELITUS, since 1984       Past history:  He has been on insulin regimen since about 2004. He has required large doses of insulin insulin persistently but with inadequate control usually Had been relatively intolerant to metformin and did not want to pay for Actos His hemoglobin A1c in the past has usually been about 8-9%  He usually has problems with  postprandial hyperglycemia despite taking large dose of mealtime insulin  RECENT history: His overall control has been persistently poor and A1c was still over 9% in 12/14.  Because of low sugars in the mornings on his last visit his Lantus was changed to the morning and the dose reduced from 50 down to 40 units He has not monitored his postprandial  readings consistently and only some readings after lunch until Friday  of last week Most of his  readings in the afternoons are high and he does not know why some fasting readings are as high as 300 His wife thinks that he is fairly compliant with his insulin Previously has been relatively noncompliant with followup, insulin regimen and glucose monitoring also  Oral hypoglycemic drugs: None at present        Side effects from medications: ? GI side effects from metformin Insulin regimen: Lantus 40  units am. NOVOLOG 07-26-09    Monitors blood glucose: Once a day.    Glucometer: One Touch.          Blood Glucose readings:  PREMEAL Breakfast Lunch Dinner Bedtime Overall  Glucose range:  84-302    134-238     Mean/median:  164    221    172    POST-MEAL PC Breakfast PC Lunch PC Dinner  Glucose range:   74-288    Mean/median:   186     Hypoglycemia frequency: Minimal, low normal reading of 73 at 4 AM once            Meals: 3 meals per day.     he is generally eating vegetarian meals but his wife says he is getting an egg for breakfast and some chicken at dinner      Physical activity: exercise: Minimal        Complications: are: Atherosclerotic vascular disease, recent stroke     Lab Results  Component Value Date   HGBA1C 8.2* 02/04/2016   HGBA1C 8.0 10/19/2015   HGBA1C 8.2* 07/05/2015   Lab Results  Component Value Date   MICROALBUR 1.3 08/13/2015   LDLCALC 69 02/04/2016   CREATININE 1.04 02/04/2016    HYPERTENSION:  he has previously been on multiple drugs including labetalol and Diovan but not clear if he is taking any medications Appears to be only on metoprolol with fairly good blood pressure today No change in renal function     Medication List       This list is accurate as of: 02/09/16 11:59 PM.  Always use your most recent med list.               aspirin 81 MG tablet  Take 81 mg by mouth daily.     atorvastatin 80 MG tablet  Commonly known as:  LIPITOR  Take 1 tablet (80 mg total) by mouth daily.     calcium carbonate 600 MG Tabs tablet  Commonly  known as:  OS-CAL  Take 1,200 mg by mouth daily with breakfast.     colesevelam 625 MG tablet  Commonly known as:  WELCHOL  Take 3 tablets twice a day     glucose blood test strip  Commonly known as:  ACCU-CHEK AVIVA PLUS  USE AS DIRECTED 3 TIMES A DAY Dx code E11.65     insulin glargine 100 UNIT/ML injection  Commonly known as:  LANTUS  Inject 0.46 mLs (46 Units total) into the skin daily.     isosorbide mononitrate 30 MG 24 hr tablet  Commonly known as:  IMDUR  Take 1 tablet (30 mg total) by mouth daily.     KLOR-CON 10 10 MEQ tablet  Generic drug:  potassium chloride  Take 10 mEq by mouth daily.     memantine 10 MG tablet  Commonly known as:  NAMENDA  Take 10 mg by mouth daily.     metFORMIN 500 MG 24 hr tablet  Commonly known as:  GLUCOPHAGE-XR  TAKE 3 TABLETS BY MOUTH ONCE DAILY WITH SUPPER     metoprolol tartrate 12.5 mg Tabs tablet  Commonly known as:  LOPRESSOR  Take 0.5 tablets (12.5 mg total) by mouth 2 (two) times daily.     NESINA 25 MG Tabs  Generic drug:  Alogliptin Benzoate  Take 25 mg by mouth.     nitroGLYCERIN 0.4 MG SL tablet  Commonly known as:  NITROSTAT  Place 1 tablet (0.4 mg total) under the tongue every 5 (five) minutes x 3 doses as needed for chest pain.     NOVOLOG FLEXPEN 100 UNIT/ML FlexPen  Generic drug:  insulin aspart  Inject 5 Units into the skin 2 (two) times daily.     ticagrelor 90 MG Tabs tablet  Commonly known as:  BRILINTA  Take 1 tablet (90 mg total) by mouth 2 (two) times daily.     valsartan 160 MG tablet  Commonly known as:  DIOVAN  TAKE 1 TABLET (160 MG TOTAL) BY MOUTH DAILY.     VICKS VAPORUB EX  Apply 1 application topically daily as needed.        Allergies: No Known Allergies  Past Medical History  Diagnosis Date  . Hypertension   . Arthritis   . Dyslipidemia   . Coronary artery disease   . Diabetes mellitus     insulin dependent    Past Surgical History  Procedure Laterality Date  . Open heart  surgery  1998  . Coronary artery bypass graft    . Cardiac catheterization    . Left heart catheterization with coronary angiogram N/A 09/15/2013    Procedure: LEFT HEART CATHETERIZATION WITH CORONARY ANGIOGRAM;  Surgeon: Clent Demark, MD;  Location: [facility] CATH LAB;  Service: Cardiovascular;  Laterality: N/A;  . Percutaneous stent intervention  09/15/2013    Procedure: PERCUTANEOUS STENT INTERVENTION;  Surgeon: Clent Demark, MD;  Location: [place] CATH LAB;  Service: Cardiovascular;;    No family history on file.  Social History:  reports that he has never smoked. He has never used smokeless tobacco. He reports that he does not drink alcohol or use illicit drugs.  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   May have mild difficulty walking at times   Still having some memory issues and is on Namenda   Diabetic foot exam was normal in 01/2015    Examination:   BP 136/82 mmHg  Pulse 84  Temp(Src) 97.9 F (36.6 C)  Resp 14  Ht 5\' 8"  (1.727 m)  Wt 172 lb (78.019 kg)  BMI 26.16 kg/m2  SpO2 98%  Body mass index is 26.16 kg/(m^2).     Assesment/PLAN:   Diabetes type 2, uncontrolled See history of present illness for detailed discussion of his current management, blood sugar patterns and problems identified His blood sugars are Still difficult to control with A1c over 8% He is not taking any Humalog at breakfast time  and he does have carbohydrate in this meal His diet is relatively high in  carbohydrate and less in protein Unable to adjust his doses of Humalog at suppertime since he does not monitor postprandially  Recommendations: Discussed timing and targets of blood sugars at various times He does need to check sugars after breakfast and supper and less in the morning Start taking Humalog at breakfast consistently Add protein to each meal consistently, discussed sources of protein Given him instructions in his own language No change in oral hypoglycemics which he is tolerating, we will continue metformin at 1000 mg  HYPERCHOLESTEROLEMIA: Better controlled now  Hypertension: Consistently controlled on present regimen  Counseling time on subjects discussed above is over 50% of today's 25 minute visit   Patient Instructions  15 Novolog at breakfast daily  Check blood sugars on waking up  3-4 times a week  Also check blood sugars about 2 hours after a meal and do this after different meals by rotation including dinner and Breakfast  Recommended blood sugar levels on waking up is 90-130 and about 2 hours after meal is 130-160  Please bring your blood sugar monitor to each visit, thank you  Have beans or dairy protein at each meal    Tiffony Kite 02/10/2016, 9:20 AM

## 2016-02-09 NOTE — Patient Instructions (Addendum)
15 Novolog at breakfast daily  Check blood sugars on waking up  3-4 times a week  Also check blood sugars about 2 hours after a meal and do this after different meals by rotation including dinner and Breakfast  Recommended blood sugar levels on waking up is 90-130 and about 2 hours after meal is 130-160  Please bring your blood sugar monitor to each visit, thank you  Have beans or dairy protein at each meal

## 2016-02-10 ENCOUNTER — Other Ambulatory Visit: Payer: Self-pay | Admitting: Endocrinology

## 2016-02-11 ENCOUNTER — Telehealth: Payer: Self-pay | Admitting: Endocrinology

## 2016-02-11 MED ORDER — INSULIN ASPART 100 UNIT/ML ~~LOC~~ SOLN
SUBCUTANEOUS | Status: DC
Start: 2016-02-11 — End: 2017-04-14

## 2016-02-11 NOTE — Telephone Encounter (Signed)
Only NovoLog.  Need to confirm what insulin he is taking

## 2016-02-11 NOTE — Telephone Encounter (Signed)
Please see below, is he suppose to be on both?

## 2016-02-11 NOTE — Telephone Encounter (Signed)
Patient need refill of humalog and Novalog send to  [id number] - [place], [place] - [address] AT [address] [phone] (Phone) [phone] (Fax)

## 2016-02-11 NOTE — Telephone Encounter (Signed)
I'm unable to reach him at this time, I called the pharmacy and she said Humalog is not covered under his insurance so he's been taking Novolog, last year he was taking Humalog.

## 2016-04-07 ENCOUNTER — Other Ambulatory Visit: Payer: Self-pay | Admitting: Endocrinology

## 2016-05-01 ENCOUNTER — Encounter: Payer: Self-pay | Admitting: Endocrinology

## 2016-05-08 ENCOUNTER — Other Ambulatory Visit: Payer: Self-pay

## 2016-05-11 ENCOUNTER — Ambulatory Visit: Payer: Self-pay | Admitting: Endocrinology

## 2016-06-01 ENCOUNTER — Encounter: Payer: Self-pay | Admitting: Endocrinology

## 2016-06-01 ENCOUNTER — Ambulatory Visit (INDEPENDENT_AMBULATORY_CARE_PROVIDER_SITE_OTHER): Payer: Medicare HMO | Admitting: Endocrinology

## 2016-06-01 VITALS — BP 130/70 | HR 68 | Ht 68.0 in | Wt 172.0 lb

## 2016-06-01 DIAGNOSIS — E1165 Type 2 diabetes mellitus with hyperglycemia: Secondary | ICD-10-CM | POA: Diagnosis not present

## 2016-06-01 DIAGNOSIS — Z794 Long term (current) use of insulin: Secondary | ICD-10-CM

## 2016-06-01 DIAGNOSIS — I10 Essential (primary) hypertension: Secondary | ICD-10-CM

## 2016-06-01 LAB — POCT GLYCOSYLATED HEMOGLOBIN (HGB A1C): HEMOGLOBIN A1C: 8

## 2016-06-01 MED ORDER — INSULIN REGULAR HUMAN 100 UNIT/ML IJ SOLN: INTRAMUSCULAR | 2 refills | Status: DC

## 2016-06-01 MED ORDER — INSULIN NPH (HUMAN) (ISOPHANE) 100 UNIT/ML ~~LOC~~ SUSP: SUBCUTANEOUS | 2 refills | Status: DC

## 2016-06-01 NOTE — Patient Instructions (Signed)
Novolin N insulin am 25 and bedtime 25 units   Novolin R, breakfast 10 units, lunch time 25 and dinner 25, 15 min before  Check blood sugars on waking up    Also check blood sugars about 2 hours after a meal and do this after different meals by rotation  Recommended blood sugar levels on waking up is 90-130 and about 2 hours after meal is 130-160  Please bring your blood sugar monitor to each visit, thank you

## 2016-06-01 NOTE — Progress Notes (Signed)
Patient ID: Calvin Perez, male   DOB: [birth year], 75 y.o.   MRN: [id number]   Reason for Appointment: Diabetes follow-up   History of Present Illness  Diagnosis: Type 2 DIABETES MELITUS, since 1984       Past history:  He has been on insulin regimen since about 2004. He has required large doses of insulin insulin persistently but with inadequate control usually Had been relatively intolerant to metformin and did not want to pay for Actos His hemoglobin A1c in the past has usually been about 8-9%  He usually has problems with  postprandial hyperglycemia despite taking large dose of mealtime insulin  RECENT history:  Insulin regimen: Lantus 50 units am.  Humalog 0-35-25 before meals  His A1c is still high at  8%    Current blood sugar patterns and problems:  His fasting readings are generally fairly good but somewhat variable  Despite reminders he is checking blood sugars only fasting and sometime in the afternoons  He still does not cover his breakfast with Humalog even though he is eating carbohydrates  Sometimes has marked increase in blood sugar after lunch, not clear if this is related to carbohydrate intake or inconsistent compliance with insulin  He gives variable responses about what insulin dose he is taking at suppertime and has no readings after supper  No hypoglycemia  He still has a relatively low protein diet with only bread and vegetables and some lentils  He now says that he cannot afford his Humalog and Lantus and has not taken Humalog 1 week  Meals: 3 meals per day. Lunch 1-2 pm, dinner 7-8 PM  Oral hypoglycemic drugs: Nesina, metformin 500 mg twice a day         Side effects from medications: ? GI side effects from metformin  Monitors blood glucose:  1-2 times a day.    Glucometer:  Accu-Chek         Blood Glucose readings recently:   Mean values apply above for all meters except median for One Touch  PRE-MEAL Fasting Lunch Afternoon   Bedtime Overall  Glucose range:   99-295    Mean/median: 155   207   176    He is generally eating vegetarian meals, he is getting lentils for breakfast and some chicken  at lunch       Physical activity: exercise:  recently going to the [facility]           Complications: are: Atherosclerotic vascular disease, stroke     Wt Readings from Last 3 Encounters:  06/01/16 172 lb (78 kg)  02/09/16 172 lb (78 kg)  10/19/15 172 lb 9.6 oz (78.3 kg)   Lab Results  Component Value Date   HGBA1C 8.0 06/01/2016   HGBA1C 8.2 (H) 02/04/2016   HGBA1C 8.0 10/19/2015   Lab Results  Component Value Date   MICROALBUR 1.3 08/13/2015   LDLCALC 69 02/04/2016   CREATININE 1.04 02/04/2016   Office Visit on 06/01/2016  Component Date Value Ref Range Status  . Hemoglobin A1C 06/01/2016 8.0   Final       Medication List       Accurate as of 06/01/16  4:29 PM. Always use your most recent med list.          ACCU-CHEK AVIVA PLUS test strip Generic drug:  glucose blood USE AS DIRECTED 3 TIMES A DAY   aspirin 81 MG tablet Take 81 mg by mouth daily.   atorvastatin 80 MG  tablet Commonly known as:  LIPITOR Take 1 tablet (80 mg total) by mouth daily.   calcium carbonate 600 MG Tabs tablet Commonly known as:  OS-CAL Take 1,200 mg by mouth daily with breakfast.   colesevelam 625 MG tablet Commonly known as:  WELCHOL Take 3 tablets twice a day   insulin aspart 100 UNIT/ML injection Commonly known as:  novoLOG Inject 15-30-25 with meals,   insulin glargine 100 UNIT/ML injection Commonly known as:  LANTUS Inject 0.46 mLs (46 Units total) into the skin daily.   insulin NPH Human 100 UNIT/ML injection Commonly known as:  NOVOLIN N In AM 25 units and bedtime 25 units.   insulin regular 100 units/mL injection Commonly known as:  NOVOLIN R 10 units at breakfast, 25 units at lunch, 25 units at dinner. 15 minutes before meals.   isosorbide mononitrate 30 MG 24 hr tablet Commonly known as:   IMDUR Take 1 tablet (30 mg total) by mouth daily.   KLOR-CON 10 10 MEQ tablet Generic drug:  potassium chloride Take 10 mEq by mouth daily.   memantine 10 MG tablet Commonly known as:  NAMENDA Take 10 mg by mouth daily.   metFORMIN 500 MG 24 hr tablet Commonly known as:  GLUCOPHAGE-XR TAKE 3 TABLETS BY MOUTH ONCE DAILY WITH SUPPER   metoprolol tartrate 12.5 mg Tabs tablet Commonly known as:  LOPRESSOR Take 0.5 tablets (12.5 mg total) by mouth 2 (two) times daily.   NESINA 25 MG Tabs Generic drug:  Alogliptin Benzoate Take 25 mg by mouth.   nitroGLYCERIN 0.4 MG SL tablet Commonly known as:  NITROSTAT Place 1 tablet (0.4 mg total) under the tongue every 5 (five) minutes x 3 doses as needed for chest pain.   ticagrelor 90 MG Tabs tablet Commonly known as:  BRILINTA Take 1 tablet (90 mg total) by mouth 2 (two) times daily.   valsartan 160 MG tablet Commonly known as:  DIOVAN TAKE 1 TABLET (160 MG TOTAL) BY MOUTH DAILY.   VICKS VAPORUB EX Apply 1 application topically daily as needed.       Allergies: No Known Allergies  Past Medical History:  Diagnosis Date  . Arthritis   . Coronary artery disease   . Diabetes mellitus    insulin dependent  . Dyslipidemia   . Hypertension     Past Surgical History:  Procedure Laterality Date  . CARDIAC CATHETERIZATION    . CORONARY ARTERY BYPASS GRAFT    . LEFT HEART CATHETERIZATION WITH CORONARY ANGIOGRAM N/A 09/15/2013   Procedure: LEFT HEART CATHETERIZATION WITH CORONARY ANGIOGRAM;  Surgeon: Robynn Pane, MD;  Location: [facility] CATH [facility];  Service: Cardiovascular;  Laterality: N/A;  . open heart surgery  1998  . PERCUTANEOUS STENT INTERVENTION  09/15/2013   Procedure: PERCUTANEOUS STENT INTERVENTION;  Surgeon: Robynn Pane, MD;  Location: [facility]ATH [facility];  Service: Cardiovascular;;    No family history on file.  Social History:  reports that he has never smoked. He has never used smokeless tobacco. He reports that he does  not drink alcohol or use drugs.  Review of Systems - Cardiovascular ROS: positive for - coronary artery disease, cerebrovascular disease   HYPERTENSION:  he has previously been on multiple drugs including labetalol and Diovan but not clear if he is taking any medications Appears to be only on metoprolol   No change in renal function   HYPERLIPIDEMIA:         The lipid abnormality consists of elevated LDL and this has  been generally difficult to control with various drugs in the past probably because of noncompliance with medications and financial reasons.   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   He has had significant anemia previously   Examination:   BP 130/70 (BP Location: Left Arm, Patient Position: Sitting, Cuff Size: Normal)   Pulse 68   Ht 5\' 8"  (1.727 m)   Wt 172 lb (78 kg)   SpO2 98%   BMI 26.15 kg/m   Body mass index is 26.15 kg/m.   No pedal edema  Assesment/PLAN:   1. Diabetes type 2, uncontrolled  The patient's diabetes control appears still inadequate with home blood sugar averaging 170 See history of present illness for details of his current management He does appear to need larger doses of mealtime coverage at most of his meals Need better assessment of postprandial readings after breakfast and supper Also since his fasting readings are recently higher will need more basal insulin Discussed blood sugar targets both before and after meals  Recommendations made today:  Start monitoring a few readings after  breakfast and lunch  Insulin doses to be as follows:Lantus 46 units am. NOVOLOG 14 AM; 28 LUNCH- 14 at dinner     Encouraged him to start some exercise  Balanced meals with enough protein at each meal   2. HYPERCHOLESTEROLEMIA: He has been on generic Lipitor and lipids are much better now from improve compliance. Liver functions are normal He thinks he is also compliant with his WelChol  Hypertension: Currently on no medications and blood pressure is high normal  Consider restarting low dose Diovan because of diabetes diabetes Need to reassess urine microalbumin  Counseling time over  50% of today's 25 minute visit   Kealey Kemmer 06/01/2016, 4:29 PM      Patient ID: LEDARIUS TORBECK, male   DOB: 06-15-1941, 75 y.o.   MRN: [id number]   Reason for Appointment: Diabetes follow-up   History of Present Illness  Diagnosis: Type 2 DIABETES MELITUS, since 1984       Past history:  He has been on insulin regimen since about 2004. He has required large doses of insulin insulin persistently but with inadequate control usually Had been relatively intolerant to metformin and did not want to pay for Actos His hemoglobin A1c in the past has usually been about 8-9%  He usually has problems with  postprandial hyperglycemia despite taking large dose of mealtime insulin  RECENT history: His overall control has been persistently poor and A1c was still over 9% in 12/14.  Because of low sugars in the mornings on his last visit his Lantus was changed to the morning and the dose reduced from 50 down to 40 units He has not monitored his postprandial  readings consistently and only some readings after lunch until Friday of last week Most of his readings in the afternoons are high and he does not know why some fasting readings are as high as 300 His wife thinks that he  is fairly compliant with his insulin Previously has been relatively noncompliant with followup, insulin regimen and glucose monitoring  also  Oral hypoglycemic drugs: None at present        Side effects from medications: ? GI side effects from metformin Insulin regimen: Lantus 40  units am. NOVOLOG 07-26-09    Monitors blood glucose: Once a day.    Glucometer: One Touch.          Blood Glucose readings:  PREMEAL Breakfast Lunch Dinner Bedtime Overall  Glucose range:  84-302    134-238     Mean/median:  164    221    172    POST-MEAL PC Breakfast PC Lunch PC Dinner  Glucose range:   74-288    Mean/median:   186     Hypoglycemia frequency: Minimal, low normal reading of 73 at 4 AM once            Meals: 3 meals per day.     he is generally eating vegetarian meals but his wife says he is getting an egg for breakfast and some chicken at dinner      Physical activity: exercise: Minimal        Complications: are: Atherosclerotic vascular disease, recent stroke     Lab Results  Component Value Date   HGBA1C 8.0 06/01/2016   HGBA1C 8.2 (H) 02/04/2016   HGBA1C 8.0 10/19/2015   Lab Results  Component Value Date   MICROALBUR 1.3 08/13/2015   LDLCALC 69 02/04/2016   CREATININE 1.04 02/04/2016    HYPERTENSION:  he has previously been on multiple drugs including labetalol and Diovan but not clear if he is taking any medications Appears to be only on metoprolol with fairly good blood pressure today No change in renal function     Medication List       Accurate as of 06/01/16  4:29 PM. Always use your most recent med list.          ACCU-CHEK AVIVA PLUS test strip Generic drug:  glucose blood USE AS DIRECTED 3 TIMES A DAY   aspirin 81 MG tablet Take 81 mg by mouth daily.   atorvastatin 80 MG tablet Commonly known as:  LIPITOR Take 1 tablet (80 mg total) by mouth daily.   calcium carbonate 600 MG Tabs tablet Commonly known as:  OS-CAL Take 1,200 mg by mouth daily with breakfast.   colesevelam 625 MG tablet Commonly known as:  WELCHOL Take 3 tablets twice a day   insulin aspart 100 UNIT/ML  injection Commonly known as:  novoLOG Inject 15-30-25 with meals,   insulin glargine 100 UNIT/ML injection Commonly known as:  LANTUS Inject 0.46 mLs (46 Units total) into the skin daily.   insulin NPH Human 100 UNIT/ML injection Commonly known as:  NOVOLIN N In AM 25 units and bedtime 25 units.   insulin regular 100 units/mL injection Commonly known as:  NOVOLIN R 10 units at breakfast, 25 units at lunch, 25 units at dinner. 15 minutes before meals.   isosorbide mononitrate 30 MG 24 hr tablet Commonly known as:  IMDUR Take 1 tablet (30 mg total) by mouth daily.   KLOR-CON 10 10 MEQ tablet Generic drug:  potassium chloride Take 10 mEq by mouth daily.   memantine 10 MG tablet Commonly known as:  NAMENDA Take 10 mg by mouth daily.   metFORMIN 500 MG 24 hr tablet Commonly known as:  GLUCOPHAGE-XR TAKE 3 TABLETS BY MOUTH ONCE DAILY WITH SUPPER  metoprolol tartrate 12.5 mg Tabs tablet Commonly known as:  LOPRESSOR Take 0.5 tablets (12.5 mg total) by mouth 2 (two) times daily.   NESINA 25 MG Tabs Generic drug:  Alogliptin Benzoate Take 25 mg by mouth.   nitroGLYCERIN 0.4 MG SL tablet Commonly known as:  NITROSTAT Place 1 tablet (0.4 mg total) under the tongue every 5 (five) minutes x 3 doses as needed for chest pain.   ticagrelor 90 MG Tabs tablet Commonly known as:  BRILINTA Take 1 tablet (90 mg total) by mouth 2 (two) times daily.   valsartan 160 MG tablet Commonly known as:  DIOVAN TAKE 1 TABLET (160 MG TOTAL) BY MOUTH DAILY.   VICKS VAPORUB EX Apply 1 application topically daily as needed.       Allergies: No Known Allergies  Past Medical History:  Diagnosis Date  . Arthritis   . Coronary artery disease   . Diabetes mellitus    insulin dependent  . Dyslipidemia   . Hypertension     Past Surgical History:  Procedure Laterality Date  . CARDIAC CATHETERIZATION    . CORONARY ARTERY BYPASS GRAFT    . LEFT HEART CATHETERIZATION WITH CORONARY  ANGIOGRAM N/A 09/15/2013   Procedure: LEFT HEART CATHETERIZATION WITH CORONARY ANGIOGRAM;  Surgeon: Clent Demark, MD;  Location: [facility];  Service: Cardiovascular;  Laterality: N/A;  . open heart surgery  1998  . PERCUTANEOUS STENT INTERVENTION  09/15/2013   Procedure: PERCUTANEOUS STENT INTERVENTION;  Surgeon: Clent Demark, MD;  Location: [facility];  Service: Cardiovascular;;    No family history on file.  Social History:  reports that he has never smoked. He has never used smokeless tobacco. He reports that he does not drink alcohol or use drugs.  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   May have mild difficulty walking at times   Still having some memory issues and is on Namenda   Diabetic foot exam was normal in 01/2015    Examination:   BP 130/70 (BP Location: Left Arm, Patient Position: Sitting, Cuff Size: Normal)   Pulse 68   Ht 5\' 8"  (1.727 m)   Wt 172 lb (78 kg)   SpO2 98%   BMI 26.15 kg/m   Body mass index is 26.15 kg/m.     Assesment/PLAN:   Diabetes type 2, uncontrolled See history of present illness for detailed discussion of his current management, blood sugar patterns and problems identified His blood sugars are Still difficult to control with A1c 8%  Tends to have high postprandial readings again Even though he is taking a high dose of Lantus than before still does not have overall better control Despite repeated instructions he does not check his blood  sugars after breakfast and supper and only in the late afternoon during the day Most of his afternoon readings are high Currently is not able to afford brand name insulin and  is asking for generics Diet is still relatively high in carbohydrate especially breakfast when he has no protein  Recommendations: Discussed timing and targets of blood sugars at various times.  Discussed that he cannot adjust her suppertime dose of insulin without postprandial readings Discussed changes in his insulin regimen to NPH and regular in detail Described the actions of both insulins and timing of injection as well as duration of action, instructions reviewed on his discharge information He will need to follow-up in a month to further adjust the doses He will call if he had any hypoglycemia with this  No change in oral hypoglycemics which he is tolerating   HYPERCHOLESTEROLEMIA: Needs follow-up on next visit  Hypertension: Consistently controlled on present regimen  Counseling time on subjects discussed above is over 50% of today's 25 minute visit   Patient Instructions  Novolin N insulin am 25 and bedtime 25 units   Novolin R, breakfast 10 units, lunch time 25 and dinner 25, 15 min before  Check blood sugars on waking up    Also check blood sugars about 2 hours after a meal and do this after different meals by rotation  Recommended blood sugar levels on waking up is 90-130 and about 2 hours after meal is 130-160  Please bring your blood sugar monitor to each visit, thank you      [facility] 06/01/2016, 4:29 PM

## 2016-06-29 ENCOUNTER — Other Ambulatory Visit: Payer: Self-pay | Admitting: Endocrinology

## 2016-07-19 ENCOUNTER — Other Ambulatory Visit: Payer: Self-pay

## 2016-07-21 ENCOUNTER — Other Ambulatory Visit: Payer: Self-pay | Admitting: *Deleted

## 2016-07-21 MED ORDER — INSULIN REGULAR HUMAN 100 UNIT/ML IJ SOLN: INTRAMUSCULAR | 2 refills | Status: DC

## 2016-07-21 MED ORDER — INSULIN NPH (HUMAN) (ISOPHANE) 100 UNIT/ML ~~LOC~~ SUSP: SUBCUTANEOUS | 3 refills | Status: DC

## 2016-07-24 ENCOUNTER — Ambulatory Visit: Payer: Self-pay | Admitting: Endocrinology

## 2016-07-25 ENCOUNTER — Encounter: Payer: Self-pay | Admitting: *Deleted

## 2016-07-28 ENCOUNTER — Other Ambulatory Visit: Payer: Self-pay | Admitting: Endocrinology

## 2016-07-29 ENCOUNTER — Other Ambulatory Visit: Payer: Self-pay | Admitting: Endocrinology

## 2016-08-01 ENCOUNTER — Other Ambulatory Visit: Payer: Self-pay

## 2016-08-01 MED ORDER — INSULIN GLARGINE 100 UNIT/ML ~~LOC~~ SOLN: 46.0000 [IU] | Freq: Every day | SUBCUTANEOUS | 1 refills | Status: DC

## 2016-08-03 ENCOUNTER — Encounter: Payer: Self-pay | Admitting: Endocrinology

## 2016-08-03 ENCOUNTER — Ambulatory Visit (INDEPENDENT_AMBULATORY_CARE_PROVIDER_SITE_OTHER): Payer: Medicare HMO | Admitting: Endocrinology

## 2016-08-03 VITALS — BP 120/70 | HR 81 | Wt 169.0 lb

## 2016-08-03 DIAGNOSIS — E782 Mixed hyperlipidemia: Secondary | ICD-10-CM

## 2016-08-03 DIAGNOSIS — Z794 Long term (current) use of insulin: Secondary | ICD-10-CM | POA: Diagnosis not present

## 2016-08-03 DIAGNOSIS — E1165 Type 2 diabetes mellitus with hyperglycemia: Secondary | ICD-10-CM | POA: Diagnosis not present

## 2016-08-03 DIAGNOSIS — I10 Essential (primary) hypertension: Secondary | ICD-10-CM | POA: Diagnosis not present

## 2016-08-03 NOTE — Patient Instructions (Addendum)
  Novolin N insulin am 35 and bedtime 25 units   Humalog, breakfast 10 units, lunch time 25 and dinner 25, 15 min before eating

## 2016-08-03 NOTE — Progress Notes (Signed)
Patient ID: Calvin Perez, male   DOB: 1940-11-05, 75 y.o.   MRN: [id number]   Reason for Appointment: Diabetes follow-up   History of Present Illness  Diagnosis: Type 2 DIABETES MELITUS, since 1984       Past history:  He has been on insulin regimen since about 2004. He has required large doses of insulin insulin persistently but with inadequate control usually Had been relatively intolerant to metformin and did not want to pay for Actos His hemoglobin A1c in the past has usually been about 8-9%  He usually has problems with  postprandial hyperglycemia despite taking large dose of mealtime insulin  RECENT history:  Insulin regimen: Lantus 45 units am.  Humalog  before meals: 25 at lunch and 30 at supper Oral hypoglycemic drugs: Nesina, metformin 500 mg twice a day  His A1c is still high at  8% as of 8/17   Current blood sugar patterns and problems:  He had complained about the cost of Lantus and was supposed to switch to NPH and regular insulin but apparently has not done so.  He ran out of Lantus 3 days ago but not clear what dose he was taking  He is quite confused and not able to give accurate answers about his insulin doses are timing of his glucose monitoring.  He does appear to have NPH at home but has not used it.  His wife has also not been involved in his care and it took some time to figure out what he is doing and what insulin he has available  He thinks he is checking his blood sugar fasting and 6 PM but his monitor indicates he is taking it around 8 AM and about 10 PM  His fasting readings are generally fairly good but were significantly high about 2-3 weeks ago and not clear why  His blood sugars at bedtime are recently overall fairly good on an average  No labs available to objectively assess his control  He still does not appear to be covering his breakfast with Humalog even though he is eating carbohydrates  No hypoglycemia  He usually has a  relatively low protein diet with only bread and vegetables and some lentils  He now says that he cannot afford his Lantus  Meals: 3 meals per day. Lunch 1-2 pm, dinner 7-8 PM           Side effects from medications: ? GI side effects from high dose metformin  Monitors blood glucose:  1-2 times a day.    Glucometer:  Accu-Chek         Blood Glucose readings from download:   Mean values apply above for all meters except median for One Touch  PRE-MEAL Fasting Lunch Dinner Bedtime Overall  Glucose range: 84-391    91-238    Mean/median: 230    160  186    He is generally eating vegetarian meals, he is getting lentils for breakfast and some chicken  at lunch       Physical activity: exercise:  recently Not going to the [facility]           Complications: are: Atherosclerotic vascular disease, stroke     Wt Readings from Last 3 Encounters:  08/03/16 169 lb (76.7 kg)  06/01/16 172 lb (78 kg)  02/09/16 172 lb (78 kg)   Lab Results  Component Value Date   HGBA1C 8.0 06/01/2016   HGBA1C 8.2 (H) 02/04/2016   HGBA1C 8.0 10/19/2015  Lab Results  Component Value Date   MICROALBUR 1.3 08/13/2015   LDLCALC 69 02/04/2016   CREATININE 1.04 02/04/2016   No visits with results within 1 Day(s) from this visit.  Latest known visit with results is:  Office Visit on 06/01/2016  Component Date Value Ref Range Status  . Hemoglobin A1C 06/01/2016 8.0   Final       Medication List       Accurate as of 08/03/16  9:39 AM. Always use your most recent med list.          ACCU-CHEK AVIVA PLUS test strip Generic drug:  glucose blood USE AS DIRECTED 3 TIMES A DAY   aspirin 81 MG tablet Take 81 mg by mouth daily.   atorvastatin 80 MG tablet Commonly known as:  LIPITOR Take 1 tablet (80 mg total) by mouth daily.   calcium carbonate 600 MG Tabs tablet Commonly known as:  OS-CAL Take 1,200 mg by mouth daily with breakfast.   colesevelam 625 MG tablet Commonly known as:  WELCHOL Take 3  tablets twice a day   insulin aspart 100 UNIT/ML injection Commonly known as:  novoLOG Inject 15-30-25 with meals,   insulin NPH Human 100 UNIT/ML injection Commonly known as:  NOVOLIN N In AM 25 units and bedtime 25 units.   insulin regular 100 units/mL injection Commonly known as:  NOVOLIN R 10 units at breakfast, 25 units at lunch, 25 units at dinner. 15 minutes before meals.   isosorbide mononitrate 30 MG 24 hr tablet Commonly known as:  IMDUR Take 1 tablet (30 mg total) by mouth daily.   KLOR-CON 10 10 MEQ tablet Generic drug:  potassium chloride Take 10 mEq by mouth daily.   memantine 10 MG tablet Commonly known as:  NAMENDA Take 10 mg by mouth daily.   metFORMIN 500 MG 24 hr tablet Commonly known as:  GLUCOPHAGE-XR TAKE 3 TABLETS BY MOUTH ONCE DAILY WITH SUPPER   metoprolol tartrate 12.5 mg Tabs tablet Commonly known as:  LOPRESSOR Take 0.5 tablets (12.5 mg total) by mouth 2 (two) times daily.   NESINA 25 MG Tabs Generic drug:  Alogliptin Benzoate Take 25 mg by mouth.   nitroGLYCERIN 0.4 MG SL tablet Commonly known as:  NITROSTAT Place 1 tablet (0.4 mg total) under the tongue every 5 (five) minutes x 3 doses as needed for chest pain.   ticagrelor 90 MG Tabs tablet Commonly known as:  BRILINTA Take 1 tablet (90 mg total) by mouth 2 (two) times daily.   valsartan 160 MG tablet Commonly known as:  DIOVAN TAKE 1 TABLET (160 MG TOTAL) BY MOUTH DAILY.   VICKS VAPORUB EX Apply 1 application topically daily as needed.       Allergies: No Known Allergies  Past Medical History:  Diagnosis Date  . Arthritis   . Coronary artery disease   . Diabetes mellitus    insulin dependent  . Dyslipidemia   . Hypertension     Past Surgical History:  Procedure Laterality Date  . CARDIAC CATHETERIZATION    . CORONARY ARTERY BYPASS GRAFT    . LEFT HEART CATHETERIZATION WITH CORONARY ANGIOGRAM N/A 09/15/2013   Procedure: LEFT HEART CATHETERIZATION WITH CORONARY  ANGIOGRAM;  Surgeon: Clent Demark, MD;  Location: [place] CATH LAB;  Service: Cardiovascular;  Laterality: N/A;  . open heart surgery  1998  . PERCUTANEOUS STENT INTERVENTION  09/15/2013   Procedure: PERCUTANEOUS STENT INTERVENTION;  Surgeon: Clent Demark, MD;  Location: [place] CATH LAB;  Service: Cardiovascular;;    No family history on file.  Social History:  reports that he has never smoked. He has never used smokeless tobacco. He reports that he does not drink alcohol or use drugs.  Review of Systems - Cardiovascular ROS: positive for - coronary artery disease, cerebrovascular disease   HYPERTENSION:  he has previously been on multiple drugs including labetalol and Diovan but not clear if he is taking any medications Appears to be only on metoprolol   No change in renal function   HYPERLIPIDEMIA:         The lipid abnormality consists of elevated LDL and this has been generally difficult to control with various drugs in the past probably because of noncompliance with medications and financial reasons.   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   He has had significant anemia previously   Examination:   BP 120/70   Pulse 81   Wt 169 lb (76.7 kg)   SpO2 97%   BMI 25.70 kg/m   Body mass index is 25.7 kg/m.   No pedal edema  Assesment/PLAN:   1. Diabetes type 2, uncontrolled  The patient's diabetes control appears still inadequate with home blood sugar averaging 170 See history of present illness for details of his current  management He does appear to need larger doses of mealtime coverage at most of his meals Need better assessment of postprandial readings after breakfast and supper Also since his fasting readings are recently higher will need more basal insulin Discussed blood sugar targets both before and after meals  Recommendations made today:  Start monitoring a few readings after breakfast and lunch  Insulin doses to be as follows:Lantus 46 units am. NOVOLOG 14 AM; 28 LUNCH- 14 at dinner     Encouraged him to start some exercise  Balanced meals with enough protein at each meal   2. HYPERCHOLESTEROLEMIA: He has been on generic Lipitor and lipids are much better now from improve compliance. Liver functions are normal He thinks he is also compliant with his WelChol  Hypertension: Currently on no medications and blood pressure is high normal  Consider restarting low dose Diovan because of diabetes diabetes Need to reassess urine microalbumin  Counseling time over  50% of today's 25 minute visit   Lelaina Oatis 08/03/2016, 9:39 AM      Patient ID: Calvin Perez, male   DOB: 1941/07/28, 75 y.o.   MRN: [id number]   Reason for Appointment: Diabetes follow-up   History of Present Illness  Diagnosis: Type 2 DIABETES MELITUS, since 1984       Past history:  He has been on insulin regimen since about 2004. He has required large doses of insulin insulin persistently but with inadequate control usually Had been relatively intolerant to metformin and did not want to pay for Actos His hemoglobin A1c in the past has usually been about 8-9%  He usually has problems with  postprandial hyperglycemia despite taking large dose of mealtime insulin  RECENT  history: His overall control has been persistently poor and A1c was still over 9% in 12/14.  Because of low sugars in the mornings on his last visit his Lantus was changed to the morning and the dose reduced from 50 down to 40 units He has not monitored  his postprandial  readings consistently and only some readings after lunch until Friday of last week Most of his readings in the afternoons are high and he does not know why some fasting readings are as high as 300 His wife thinks that he is fairly compliant with his insulin Previously has been relatively noncompliant with followup, insulin regimen and glucose monitoring also  Oral hypoglycemic drugs: None at present        Side effects from medications: ? GI side effects from metformin Insulin regimen: Lantus 40  units am. NOVOLOG 07-26-09    Monitors blood glucose: Once a day.    Glucometer: One Touch.          Blood Glucose readings:  PREMEAL Breakfast Lunch Dinner Bedtime Overall  Glucose range:  84-302    134-238     Mean/median:  164    221    172    POST-MEAL PC Breakfast PC Lunch PC Dinner  Glucose range:   74-288    Mean/median:   186     Hypoglycemia frequency: Minimal, low normal reading of 73 at 4 AM once            Meals: 3 meals per day.     he is generally eating vegetarian meals but his wife says he is getting an egg for breakfast and some chicken at dinner      Physical activity: exercise: Minimal        Complications: are: Atherosclerotic vascular disease, recent stroke     Lab Results  Component Value Date   HGBA1C 8.0 06/01/2016   HGBA1C 8.2 (H) 02/04/2016   HGBA1C 8.0 10/19/2015   Lab Results  Component Value Date   MICROALBUR 1.3 08/13/2015   LDLCALC 69 02/04/2016   CREATININE 1.04 02/04/2016    HYPERTENSION:  he has previously been on multiple drugs including labetalol and Diovan but not clear if he is taking any medications Appears to be only on metoprolol with fairly good blood pressure today No change in renal function     Medication List       Accurate as of 08/03/16  9:39 AM. Always use your most recent med list.          ACCU-CHEK AVIVA PLUS test strip Generic drug:  glucose blood USE AS DIRECTED 3 TIMES A DAY   aspirin 81 MG  tablet Take 81 mg by mouth daily.   atorvastatin 80 MG tablet Commonly known as:  LIPITOR Take 1 tablet (80 mg total) by mouth daily.   calcium carbonate 600 MG Tabs tablet Commonly known as:  OS-CAL Take 1,200 mg by mouth daily with breakfast.   colesevelam 625 MG tablet Commonly known as:  WELCHOL Take 3 tablets twice a day   insulin aspart 100 UNIT/ML injection Commonly known as:  novoLOG Inject 15-30-25 with meals,   insulin NPH Human 100 UNIT/ML injection Commonly known as:  NOVOLIN N In AM 25 units and bedtime 25 units.   insulin regular 100 units/mL injection Commonly known as:  NOVOLIN R 10 units at breakfast, 25 units at lunch, 25 units at dinner. 15 minutes before meals.   isosorbide mononitrate 30 MG 24 hr tablet Commonly known  as:  IMDUR Take 1 tablet (30 mg total) by mouth daily.   KLOR-CON 10 10 MEQ tablet Generic drug:  potassium chloride Take 10 mEq by mouth daily.   memantine 10 MG tablet Commonly known as:  NAMENDA Take 10 mg by mouth daily.   metFORMIN 500 MG 24 hr tablet Commonly known as:  GLUCOPHAGE-XR TAKE 3 TABLETS BY MOUTH ONCE DAILY WITH SUPPER   metoprolol tartrate 12.5 mg Tabs tablet Commonly known as:  LOPRESSOR Take 0.5 tablets (12.5 mg total) by mouth 2 (two) times daily.   NESINA 25 MG Tabs Generic drug:  Alogliptin Benzoate Take 25 mg by mouth.   nitroGLYCERIN 0.4 MG SL tablet Commonly known as:  NITROSTAT Place 1 tablet (0.4 mg total) under the tongue every 5 (five) minutes x 3 doses as needed for chest pain.   ticagrelor 90 MG Tabs tablet Commonly known as:  BRILINTA Take 1 tablet (90 mg total) by mouth 2 (two) times daily.   valsartan 160 MG tablet Commonly known as:  DIOVAN TAKE 1 TABLET (160 MG TOTAL) BY MOUTH DAILY.   VICKS VAPORUB EX Apply 1 application topically daily as needed.       Allergies: No Known Allergies  Past Medical History:  Diagnosis Date  . Arthritis   . Coronary artery disease   .  Diabetes mellitus    insulin dependent  . Dyslipidemia   . Hypertension     Past Surgical History:  Procedure Laterality Date  . CARDIAC CATHETERIZATION    . CORONARY ARTERY BYPASS GRAFT    . LEFT HEART CATHETERIZATION WITH CORONARY ANGIOGRAM N/A 09/15/2013   Procedure: LEFT HEART CATHETERIZATION WITH CORONARY ANGIOGRAM;  Surgeon: Clent Demark, MD;  Location: [place] CATH LAB;  Service: Cardiovascular;  Laterality: N/A;  . open heart surgery  1998  . PERCUTANEOUS STENT INTERVENTION  09/15/2013   Procedure: PERCUTANEOUS STENT INTERVENTION;  Surgeon: Clent Demark, MD;  Location: [place] CATH LAB;  Service: Cardiovascular;;    No family history on file.  Social History:  reports that he has never smoked. He has never used smokeless tobacco. He reports that he does not drink alcohol or use drugs.  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   May have mild difficulty walking at times  Appears to be getting more confused and having difficulty with remembering and has not followed up with neurologist     Diabetic foot exam was normal in 01/2015    Examination:   BP 120/70   Pulse 81   Wt 169 lb (76.7 kg)   SpO2 97%   BMI 25.70 kg/m   Body mass index is 25.7 kg/m.     Assesment/PLAN:   Diabetes type 2, uncontrolled See history of present illness for detailed discussion of his current management, blood sugar patterns and problems identified  Appears to be taking Lantus and Humalog currently on the patient is confused about how much  he is taking He is still  probably not taking Humalog at breakfast even though he has been given written instructions on his insulin doses Also he is now out of his Lantus and may not be able to refill it until next year  His fasting blood sugars are inconsistent and mostly high recently although bedtime readings are reasonably well controlled  Recommendations: Discussed checking blood sugars at various times by rotation and discussed blood sugar targets Instead of Lantus he will start taking NPH insulin 35 in the morning and 25 at bedtime to start with He needs to bring his insulin supply and written doses to each visit to evaluate what he is taking since he has significant memory issues His wife will help him with his insulin and ensure compliance He needs to make sure he takes Humalog with every meal starting with 10 units at breakfast and 25 at lunch and supper to keep the doses easier to remember Will need to adjust his insulin based on blood sugar patterns in the next month His meter was set to the proper time Encouraged him to start walking for exercise again  HYPERCHOLESTEROLEMIA: Needs follow-up   Hypertension: Consistently controlled on present regimen  Counseling time on subjects discussed above is over 50% of today's 25 minute visit   Patient Instructions   Novolin N insulin am 35 and bedtime 25 units   Humalog, breakfast 10 units, lunch time 25 and dinner 25, 15 min before eating   Kenyan Karnes 08/03/2016, 9:39 AM

## 2016-08-28 ENCOUNTER — Other Ambulatory Visit: Payer: Self-pay

## 2016-08-31 ENCOUNTER — Other Ambulatory Visit (INDEPENDENT_AMBULATORY_CARE_PROVIDER_SITE_OTHER): Payer: Medicare HMO

## 2016-08-31 ENCOUNTER — Other Ambulatory Visit: Payer: Self-pay

## 2016-08-31 ENCOUNTER — Encounter (INDEPENDENT_AMBULATORY_CARE_PROVIDER_SITE_OTHER): Payer: Medicare HMO | Admitting: Endocrinology

## 2016-08-31 DIAGNOSIS — E119 Type 2 diabetes mellitus without complications: Secondary | ICD-10-CM

## 2016-08-31 LAB — COMPREHENSIVE METABOLIC PANEL
ALBUMIN: 4.2 g/dL (ref 3.5–5.2)
ALK PHOS: 56 U/L (ref 39–117)
ALT: 22 U/L (ref 0–53)
AST: 18 U/L (ref 0–37)
BILIRUBIN TOTAL: 0.4 mg/dL (ref 0.2–1.2)
BUN: 14 mg/dL (ref 6–23)
CALCIUM: 9.7 mg/dL (ref 8.4–10.5)
CO2: 29 mEq/L (ref 19–32)
Chloride: 100 mEq/L (ref 96–112)
Creatinine, Ser: 1.18 mg/dL (ref 0.40–1.50)
GFR: 63.63 mL/min (ref >60.00)
GLUCOSE: 176 mg/dL — AB (ref 70–99)
POTASSIUM: 4.8 meq/L (ref 3.5–5.1)
Sodium: 135 mEq/L (ref 135–145)
TOTAL PROTEIN: 7.6 g/dL (ref 6.0–8.3)

## 2016-08-31 LAB — MICROALBUMIN / CREATININE URINE RATIO
CREATININE, U: 155.7 mg/dL
MICROALB UR: 1.4 mg/dL (ref 0.0–1.9)
MICROALB/CREAT RATIO: 0.9 mg/g (ref 0.0–30.0)

## 2016-08-31 LAB — LIPID PANEL
CHOLESTEROL: 250 mg/dL — AB (ref 0–200)
HDL: 39 mg/dL — AB (ref >39.00)
NONHDL: 210.74
TRIGLYCERIDES: 202 mg/dL — AB (ref 0.0–149.0)
Total CHOL/HDL Ratio: 6
VLDL: 40.4 mg/dL — ABNORMAL HIGH (ref 0.0–40.0)

## 2016-08-31 LAB — LDL CHOLESTEROL, DIRECT: Direct LDL: 176 mg/dL

## 2016-08-31 LAB — HEMOGLOBIN A1C: HEMOGLOBIN A1C: 8.6 % — AB (ref 4.6–6.5)

## 2016-08-31 NOTE — Addendum Note (Signed)
Addended by: Kaylyn Lim I on: 08/31/2016 09:41 AM   Modules accepted: Orders

## 2016-08-31 NOTE — Addendum Note (Signed)
Addended by: Kaylyn Lim I on: 08/31/2016 09:54 AM   Modules accepted: Orders

## 2016-08-31 NOTE — Progress Notes (Signed)
This encounter was created in error - please disregard.

## 2016-09-07 ENCOUNTER — Ambulatory Visit (INDEPENDENT_AMBULATORY_CARE_PROVIDER_SITE_OTHER): Payer: Medicare HMO | Admitting: Endocrinology

## 2016-09-07 ENCOUNTER — Encounter: Payer: Self-pay | Admitting: Endocrinology

## 2016-09-07 VITALS — BP 128/76 | HR 81 | Wt 173.0 lb

## 2016-09-07 DIAGNOSIS — E7800 Pure hypercholesterolemia, unspecified: Secondary | ICD-10-CM

## 2016-09-07 DIAGNOSIS — E1165 Type 2 diabetes mellitus with hyperglycemia: Secondary | ICD-10-CM | POA: Diagnosis not present

## 2016-09-07 DIAGNOSIS — E1142 Type 2 diabetes mellitus with diabetic polyneuropathy: Secondary | ICD-10-CM

## 2016-09-07 DIAGNOSIS — I10 Essential (primary) hypertension: Secondary | ICD-10-CM

## 2016-09-07 DIAGNOSIS — Z794 Long term (current) use of insulin: Secondary | ICD-10-CM

## 2016-09-07 MED ORDER — GABAPENTIN 300 MG PO CAPS: 300.0000 mg | ORAL_CAPSULE | Freq: Three times a day (TID) | ORAL | 3 refills | Status: DC

## 2016-09-07 MED ORDER — INSULIN LISPRO 100 UNIT/ML ~~LOC~~ SOLN: SUBCUTANEOUS | 11 refills | Status: DC

## 2016-09-07 MED ORDER — ATORVASTATIN CALCIUM 80 MG PO TABS: 80.0000 mg | ORAL_TABLET | Freq: Every day | ORAL | 3 refills | Status: DC

## 2016-09-07 MED ORDER — INSULIN NPH (HUMAN) (ISOPHANE) 100 UNIT/ML ~~LOC~~ SUSP: SUBCUTANEOUS | 11 refills | Status: DC

## 2016-09-07 NOTE — Patient Instructions (Addendum)
Stop taking Novolog  While continuing LANTUS insulin take HUMALOG as follows:  HUMALOG 14 units at breakfast, 40 units at lunch and 30 at [facility]  When you finished the LANTUS he will need to take the following injections with a syringe.  Novolin N insulin in the morning will be 35 and bedtime will be 25 units   Humalog: Take before breakfast 10 units, lunch time 25 and dinner 30 units   Take gabapentin 3 times a day as needed for numbness in the feet  Restart ATORVASTATIN for cholesterol

## 2016-09-07 NOTE — Progress Notes (Signed)
Patient ID: Calvin Perez, male   DOB: [birth year], 75 y.o.   MRN: [id number]   Reason for Appointment: Diabetes follow-up   History of Present Illness  Diagnosis: Type 2 DIABETES MELITUS, since 1984       Past history:  He has been on insulin regimen since about 2004. He has required large doses of insulin insulin persistently but with inadequate control usually Had been relatively intolerant to metformin and did not want to pay for Actos His hemoglobin A1c in the past has usually been about 8-9%  He usually has problems with  postprandial hyperglycemia despite taking large dose of mealtime insulin  RECENT history:  Insulin regimen: Lantus 45 units am.  Rapid acting insulin before meals: 35 at lunch and 30 at supper.  He is taking 5 units of NovoLog with the rest Humalog at each meal Oral hypoglycemic drugs: metformin 500 mg twice a day  His A1c is still high at 8.6, previously 8%    Current blood sugar patterns and problems:  He had complained about the cost of Lantus and was supposed to switch to NPH and regular insulin pen for the third time he still has not done so.  He was sent prescription for the NPH and regular but he still continues to take Lantus and Humalog/NovoLog as above  Not clear why he takes 5 units of Novolog along with Humalog, does not understand the functions of the insulin types he is taking.  Also his NovoLog pen is expired in August  His monitor has incorrect time programmed for the last couple of weeks and difficult to know what time he is checking his blood sugars, also difficult to get an idea which readings are post prandial.  Despite instructions he only checks his blood sugars in the morning and afternoon and not at other times by rotation as discussed on each visit  He still does not appear to understand the need to be taking Humalog at breakfast also, he only takes Lantus at that time  No hypoglycemia  He is generally eating vegetarian  meals, he is getting lentils for breakfast and some chicken  at lunch   He usually has a relatively low protein diet with only bread and vegetables and some lentils.  He was told to keep a record of his insulin administration and he has not done so  Meals: 3 meals per day. Lunch 1-2 pm, dinner 7-8 PM           Side effects from medications: ? GI side effects from high dose metformin  Monitors blood glucose:  1-2 times a day.    Glucometer:  Accu-Chek         Blood Glucose readings from download:   Mean values apply above for all meters except median for One Touch  PRE-MEAL Fasting Lunch Dinner Bedtime Overall  Glucose range: 110-197   125-247     Mean/median:     162     Physical activity: exercise: Irregular recently         Complications: are: Atherosclerotic vascular disease, stroke     Wt Readings from Last 3 Encounters:  09/07/16 173 lb (78.5 kg)  08/03/16 169 lb (76.7 kg)  06/01/16 172 lb (78 kg)   Lab Results  Component Value Date   HGBA1C 8.6 (H) 08/31/2016   HGBA1C 8.0 06/01/2016   HGBA1C 8.2 (H) 02/04/2016   Lab Results  Component Value Date   MICROALBUR 1.4 08/31/2016   [place]  69 02/04/2016   CREATININE 1.18 08/31/2016    OTHER active problems: See review of systems    No visits with results within 1 Day(s) from this visit.  Latest known visit with results is:  Lab on 08/31/2016  Component Date Value Ref Range Status  . Cholesterol 08/31/2016 250* 0 - 200 mg/dL Final  . Triglycerides 08/31/2016 202.0* 0.0 - 149.0 mg/dL Final  . HDL 08/31/2016 39.00* >39.00 mg/dL Final  . VLDL 08/31/2016 40.4* 0.0 - 40.0 mg/dL Final  . Total CHOL/HDL Ratio 08/31/2016 6   Final  . NonHDL 08/31/2016 210.74   Final  . Sodium 08/31/2016 135  135 - 145 mEq/L Final  . Potassium 08/31/2016 4.8  3.5 - 5.1 mEq/L Final  . Chloride 08/31/2016 100  96 - 112 mEq/L Final  . CO2 08/31/2016 29  19 - 32 mEq/L Final  . Glucose, Bld 08/31/2016 176* 70 - 99 mg/dL Final  . BUN  08/31/2016 14  6 - 23 mg/dL Final  . Creatinine, Ser 08/31/2016 1.18  0.40 - 1.50 mg/dL Final  . Total Bilirubin 08/31/2016 0.4  0.2 - 1.2 mg/dL Final  . Alkaline Phosphatase 08/31/2016 56  39 - 117 U/L Final  . AST 08/31/2016 18  0 - 37 U/L Final  . ALT 08/31/2016 22  0 - 53 U/L Final  . Total Protein 08/31/2016 7.6  6.0 - 8.3 g/dL Final  . Albumin 08/31/2016 4.2  3.5 - 5.2 g/dL Final  . Calcium 08/31/2016 9.7  8.4 - 10.5 mg/dL Final  . GFR 08/31/2016 63.63  >60.00 mL/min Final  . Hgb A1c MFr Bld 08/31/2016 8.6* 4.6 - 6.5 % Final  . Microalb, Ur 08/31/2016 1.4  0.0 - 1.9 mg/dL Final  . Creatinine,U 08/31/2016 155.7  mg/dL Final  . Microalb Creat Ratio 08/31/2016 0.9  0.0 - 30.0 mg/g Final  . Direct LDL 08/31/2016 176.0  mg/dL Final       Medication List       Accurate as of 09/07/16 11:59 PM. Always use your most recent med list.          ACCU-CHEK AVIVA PLUS test strip Generic drug:  glucose blood USE AS DIRECTED 3 TIMES A DAY   aspirin 81 MG tablet Take 81 mg by mouth daily.   atorvastatin 80 MG tablet Commonly known as:  LIPITOR Take 1 tablet (80 mg total) by mouth daily.   atorvastatin 80 MG tablet Commonly known as:  LIPITOR Take 1 tablet (80 mg total) by mouth daily.   calcium carbonate 600 MG Tabs tablet Commonly known as:  OS-CAL Take 1,200 mg by mouth daily with breakfast.   colesevelam 625 MG tablet Commonly known as:  WELCHOL Take 3 tablets twice a day   gabapentin 300 MG capsule Commonly known as:  NEURONTIN Take 1 capsule (300 mg total) by mouth 3 (three) times daily.   insulin aspart 100 UNIT/ML injection Commonly known as:  novoLOG Inject 15-30-25 with meals,   insulin lispro 100 UNIT/ML injection Commonly known as:  HUMALOG Take before breakfast 10 units, lunch time 25 and dinner 30 units   insulin NPH Human 100 UNIT/ML injection Commonly known as:  NOVOLIN N In AM 25 units and bedtime 25 units.   insulin NPH Human 100 UNIT/ML  injection Commonly known as:  NOVOLIN N RELION morning 35 units and bedtime 25 units   insulin regular 250 units/2.51mL (100 units/mL) injection Commonly known as:  NOVOLIN R 10 units at breakfast, 25 units at  lunch, 25 units at dinner. 15 minutes before meals.   isosorbide mononitrate 30 MG 24 hr tablet Commonly known as:  IMDUR Take 1 tablet (30 mg total) by mouth daily.   KLOR-CON 10 10 MEQ tablet Generic drug:  potassium chloride Take 10 mEq by mouth daily.   memantine 10 MG tablet Commonly known as:  NAMENDA Take 10 mg by mouth daily.   metFORMIN 500 MG 24 hr tablet Commonly known as:  GLUCOPHAGE-XR TAKE 3 TABLETS BY MOUTH ONCE DAILY WITH SUPPER   metoprolol tartrate 12.5 mg Tabs tablet Commonly known as:  LOPRESSOR Take 0.5 tablets (12.5 mg total) by mouth 2 (two) times daily.   NESINA 25 MG Tabs Generic drug:  Alogliptin Benzoate Take 25 mg by mouth.   nitroGLYCERIN 0.4 MG SL tablet Commonly known as:  NITROSTAT Place 1 tablet (0.4 mg total) under the tongue every 5 (five) minutes x 3 doses as needed for chest pain.   ticagrelor 90 MG Tabs tablet Commonly known as:  BRILINTA Take 1 tablet (90 mg total) by mouth 2 (two) times daily.   valsartan 160 MG tablet Commonly known as:  DIOVAN TAKE 1 TABLET (160 MG TOTAL) BY MOUTH DAILY.   VICKS VAPORUB EX Apply 1 application topically daily as needed.       Allergies: No Known Allergies  Past Medical History:  Diagnosis Date  . Arthritis   . Coronary artery disease   . Diabetes mellitus    insulin dependent  . Dyslipidemia   . Hypertension     Past Surgical History:  Procedure Laterality Date  . CARDIAC CATHETERIZATION    . CORONARY ARTERY BYPASS GRAFT    . LEFT HEART CATHETERIZATION WITH CORONARY ANGIOGRAM N/A 09/15/2013   Procedure: LEFT HEART CATHETERIZATION WITH CORONARY ANGIOGRAM;  Surgeon: Clent Demark, MD;  Location: [place] [facility]ATH [facility];  Service: Cardiovascular;  Laterality: N/A;  . open heart  surgery  1998  . PERCUTANEOUS STENT INTERVENTION  09/15/2013   Procedure: PERCUTANEOUS STENT INTERVENTION;  Surgeon: Clent Demark, MD;  Location: [place] CATH [facility];  Service: Cardiovascular;;    No family history on file.  Social History:  reports that he has never smoked. He has never used smokeless tobacco. He reports that he does not drink alcohol or use drugs.    Review of Systems   Cardiovascular ROS: positive for - coronary artery disease, cerebrovascular disease  HYPERTENSION:  he has fairly good control now with adding back Diovan continues to take metoprolol His medications were checked today in the office Blood pressure was initially high when he came in but repeat blood pressure was normal  HYPERLIPIDEMIA:         The lipid abnormality consists of elevated LDL and this has been generally difficult to control with various drugs in the past probably because of noncompliance with medications and financial reasons.   He is not taking his Lipitor, this is not in his medication back today He probably has forgotten to refill this and his lipids are markedly increased again    Lab Results  Component Value Date   CHOL 250 (H) 08/31/2016   HDL 39.00 (L) 08/31/2016   LDLCALC 69 02/04/2016   LDLDIRECT 176.0 08/31/2016   TRIG 202.0 (H) 08/31/2016   CHOLHDL 6 08/31/2016    More recently has had problems with some tingling sensation in his feet which she has difficulty describing.  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   He has had significant anemia previously   Examination:   BP 128/76 (Cuff Size: Normal)   Pulse 81   Wt 173 lb (78.5 kg)   SpO2 98%   BMI 26.30 kg/m    Body mass index is 26.3 kg/m.   No pedal edema  Diabetic Foot Exam - Simple   Simple Foot Form Diabetic Foot exam was performed with the following findings:  Yes   Visual Inspection No deformities, no ulcerations, no other skin breakdown bilaterally:  Yes Sensation Testing Intact to touch and monofilament testing bilaterally:  Yes Pulse Check Posterior Tibialis and Dorsalis pulse intact bilaterally:  Yes Comments      Assesment/PLAN:   1. Diabetes type 2, uncontrolled See history of present illness for detailed discussion of current diabetes management, blood sugar patterns and problems identified  He was seen in short-term follow-up today because of difficulty getting an accurate history of  medications, insulin and compliance with daily regimen Even that he wants to stop taking Lantus because of cost he still did not pick up the prescription of the NPH that was given and again complaining about the cost Blood sugar control is fair, likely has high postprandial readings after breakfast and supper which he is not monitoring Also does not understand which insulin he needs to take, taking both NovoLog and Humalog at lunch and supper  Recommendations made today:  Start monitoring blood sugars by rotation at different times  Only take Humalog, his NovoLog is expired  Been running out of Lantus he can switch to NPH at bedtime and fasting, 25 and 35 units respectively  He knows how to use an insulin syringe so he can get these from [employer] regular exercise  Balanced meals with enough protein at each meal   Given the blood sugar diary and he will keep a record of his insulin doses also Insulin doses discussed below  2. HYPERCHOLESTEROLEMIA: He has been out of his generic Lipitor and lipids are much higher because of noncompliance Previously on WelChol, apparently has not taken it, does not have this in his medication supply at home Use to start back on this since  they on it indefinitely  Hypertension: Currently on Diovan and metoprolol and blood pressure is much better now  NEUROPATHY: He appears to be having some sensory symptoms and he can start taking gabapentin 300 mg as needed.  Discussed side effects and taking this at mealtimes and mostly as needed  Counseling time over  50% of today's 25 minute visit  Patient Instructions  Stop taking Novolog  While continuing LANTUS insulin take HUMALOG as follows:  HUMALOG 14 units at breakfast, 40 units at lunch and 30 at [facility]  When you finished the LANTUS he will need to take the following injections with a syringe.  Novolin N insulin in the morning will be 35 and bedtime will be 25 units   Humalog: Take before breakfast 10 units, lunch time 25 and dinner 30 units   Take gabapentin 3 times a day as needed for numbness in the feet  Restart ATORVASTATIN for cholesterol    Jesusmanuel Erbes 09/08/2016, 9:35 PM

## 2016-10-23 ENCOUNTER — Telehealth: Payer: Self-pay | Admitting: Endocrinology

## 2016-10-23 ENCOUNTER — Ambulatory Visit: Payer: Self-pay | Admitting: Endocrinology

## 2016-10-23 ENCOUNTER — Other Ambulatory Visit: Payer: Self-pay | Admitting: Endocrinology

## 2016-10-23 NOTE — Telephone Encounter (Signed)
Patient no showed today's appt. Please advise on how to follow up. °A. No follow up necessary. °B. Follow up urgent. Contact patient immediately. °C. Follow up necessary. Contact patient and schedule visit in ___ days. °D. Follow up advised. Contact patient and schedule visit in ____weeks. ° °

## 2016-10-24 NOTE — Telephone Encounter (Signed)
Need to be rescheduled as soon as possible.  If he has had more than 3 no shows may consider dismissal

## 2016-10-25 ENCOUNTER — Encounter: Payer: Self-pay | Admitting: Endocrinology

## 2016-10-25 NOTE — Telephone Encounter (Signed)
Mailing a letter to [facility] there was no answer and no voicemail set up

## 2016-12-06 ENCOUNTER — Other Ambulatory Visit: Payer: Self-pay | Admitting: Cardiovascular Disease

## 2016-12-06 ENCOUNTER — Ambulatory Visit
Admission: RE | Admit: 2016-12-06 | Discharge: 2016-12-06 | Disposition: A | Discharge status: Home / Self Care | Payer: Medicare HMO | Source: Ambulatory Visit | Attending: Cardiovascular Disease | Admitting: Cardiovascular Disease

## 2016-12-06 DIAGNOSIS — M542 Cervicalgia: Secondary | ICD-10-CM

## 2016-12-17 ENCOUNTER — Other Ambulatory Visit: Payer: Self-pay | Admitting: Endocrinology

## 2017-01-10 ENCOUNTER — Encounter: Payer: Medicare HMO | Admitting: Endocrinology

## 2017-01-10 ENCOUNTER — Encounter: Payer: Self-pay | Admitting: Endocrinology

## 2017-01-10 VITALS — BP 150/70 | HR 87 | Ht 68.0 in | Wt 167.0 lb

## 2017-01-10 DIAGNOSIS — Z794 Long term (current) use of insulin: Principal | ICD-10-CM

## 2017-01-10 DIAGNOSIS — E782 Mixed hyperlipidemia: Secondary | ICD-10-CM

## 2017-01-10 DIAGNOSIS — E1165 Type 2 diabetes mellitus with hyperglycemia: Secondary | ICD-10-CM

## 2017-01-10 LAB — COMPREHENSIVE METABOLIC PANEL
ALT: 13 U/L (ref 0–53)
AST: 14 U/L (ref 0–37)
Albumin: 4 g/dL (ref 3.5–5.2)
Alkaline Phosphatase: 65 U/L (ref 39–117)
BILIRUBIN TOTAL: 0.8 mg/dL (ref 0.2–1.2)
BUN: 16 mg/dL (ref 6–23)
CO2: 26 mEq/L (ref 19–32)
Calcium: 9.5 mg/dL (ref 8.4–10.5)
Chloride: 100 mEq/L (ref 96–112)
Creatinine, Ser: 1.06 mg/dL (ref 0.40–1.50)
GFR: 71.95 mL/min (ref >60.00)
Glucose, Bld: 200 mg/dL — ABNORMAL HIGH (ref 70–99)
Potassium: 4.4 mEq/L (ref 3.5–5.1)
Sodium: 135 mEq/L (ref 135–145)
Total Protein: 8 g/dL (ref 6.0–8.3)

## 2017-01-10 LAB — LIPID PANEL
CHOL/HDL RATIO: 3
Cholesterol: 114 mg/dL (ref 0–200)
HDL: 36.6 mg/dL — AB (ref >39.00)
LDL CALC: 63 mg/dL (ref 0–99)
NONHDL: 76.93
Triglycerides: 69 mg/dL (ref 0.0–149.0)
VLDL: 13.8 mg/dL (ref 0.0–40.0)

## 2017-01-10 LAB — POCT GLYCOSYLATED HEMOGLOBIN (HGB A1C): Hemoglobin A1C: 7.6

## 2017-01-10 NOTE — Progress Notes (Signed)
This encounter was created in error - please disregard.

## 2017-01-12 ENCOUNTER — Ambulatory Visit
Admission: RE | Admit: 2017-01-12 | Discharge: 2017-01-12 | Disposition: A | Discharge status: Home / Self Care | Payer: Medicare HMO | Source: Ambulatory Visit | Attending: Cardiovascular Disease | Admitting: Cardiovascular Disease

## 2017-01-12 ENCOUNTER — Encounter: Payer: Self-pay | Admitting: Endocrinology

## 2017-01-12 ENCOUNTER — Ambulatory Visit (INDEPENDENT_AMBULATORY_CARE_PROVIDER_SITE_OTHER): Payer: Medicare HMO | Admitting: Endocrinology

## 2017-01-12 ENCOUNTER — Other Ambulatory Visit (INDEPENDENT_AMBULATORY_CARE_PROVIDER_SITE_OTHER): Payer: Medicare HMO

## 2017-01-12 ENCOUNTER — Other Ambulatory Visit: Payer: Self-pay | Admitting: Cardiovascular Disease

## 2017-01-12 VITALS — BP 130/68 | HR 68 | Ht 64.0 in | Wt 169.0 lb

## 2017-01-12 DIAGNOSIS — G63 Polyneuropathy in diseases classified elsewhere: Secondary | ICD-10-CM | POA: Diagnosis not present

## 2017-01-12 DIAGNOSIS — M1000 Idiopathic gout, unspecified site: Secondary | ICD-10-CM

## 2017-01-12 DIAGNOSIS — W19XXXA Unspecified fall, initial encounter: Secondary | ICD-10-CM

## 2017-01-12 DIAGNOSIS — I10 Essential (primary) hypertension: Secondary | ICD-10-CM

## 2017-01-12 DIAGNOSIS — E1165 Type 2 diabetes mellitus with hyperglycemia: Secondary | ICD-10-CM | POA: Diagnosis not present

## 2017-01-12 DIAGNOSIS — R52 Pain, unspecified: Secondary | ICD-10-CM

## 2017-01-12 DIAGNOSIS — Z794 Long term (current) use of insulin: Secondary | ICD-10-CM

## 2017-01-12 DIAGNOSIS — E782 Mixed hyperlipidemia: Secondary | ICD-10-CM

## 2017-01-12 DIAGNOSIS — R609 Edema, unspecified: Secondary | ICD-10-CM

## 2017-01-12 LAB — URIC ACID: URIC ACID, SERUM: 7.6 mg/dL (ref 4.0–7.8)

## 2017-01-12 NOTE — Patient Instructions (Signed)
Check blood sugars on waking up  3x weekly  Also check blood sugars about 2-3 hours after a meal and do this after different meals by rotation  Recommended blood sugar levels on waking up is 90-130 and about 2 hours after meal is 130-160  Please bring your blood sugar monitor to each visit, thank you  NOVOLIN N INSULIN 45 IN AM AND 10 UNITS AT BEDTIME  REGULAR INSULIN 10 units at breakfast, 30 at lujnch nand 25 at dinner  If sugar high at bedtime take 30 units at dinner

## 2017-01-12 NOTE — Progress Notes (Signed)
Patient ID: Calvin Perez, male   DOB: 18-Feb-1941, 76 y.o.   MRN: [id number]   Reason for Appointment: Diabetes follow-up   History of Present Illness  Diagnosis: Type 2 DIABETES MELITUS, since 1984       Past history:  He has been on insulin regimen since about 2004. He has required large doses of insulin insulin persistently but with inadequate control usually Had been relatively intolerant to metformin and did not want to pay for Actos His hemoglobin A1c in the past has usually been about 8-9%  He usually has problems with  postprandial hyperglycemia despite taking large dose of mealtime insulin  RECENT history:  Insulin regimen: N 45 units am.  Novolin R insulin before meals: 30 at lunch and 25 at supper.  Oral hypoglycemic drugs: metformin 500 mg twice a day  His A1c is still high at 8.6, previously 8%    Current blood sugar patterns and problems:  He had been switched from Lantus to NPH insulin and he finally did this after his last visit  However even though he was told to take his NPH twice a day he is taking this inconsistently in the evening  Blood sugars are being monitored only FASTING with only a couple of nonfasting readings  His monitor has incorrect date and time programmed and morning readings are showing up in the evening  His fasting readings are fluctuating significantly with some good readings and several readings in the upper 100 range, not clear if readings are better when he takes NPH at dinner also  He has only one reading around suppertime which was well over 200 and his lab glucose was 200 after breakfast  No exercise recently  He still does not appear to understand the need to be taking rapid acting insulin at breakfast also, he only takes NPH at that time  No hypoglycemia  He is generally eating vegetarian meals, he is getting only a small amount of bread without protein for breakfast and some chicken  at lunch   He is eating rice and  lentils at dinner now  Meals: 3 meals per day. Lunch 1-2 pm, dinner 7-8 PM           Side effects from medications: ? GI side effects from high dose metformin  Monitors blood glucose:  1-2 times a day.    Glucometer:  Accu-Chek         Blood Glucose readings from download:   Mean values apply above for all meters except median for One Touch  PRE-MEAL Fasting Lunch Dinner Bedtime Overall  Glucose range:  79-232       Mean/median: 156     161    Physical activity: exercise: Irregular recently         Complications: are: Atherosclerotic vascular disease, stroke     Wt Readings from Last 3 Encounters:  01/12/17 169 lb (76.7 kg)  01/10/17 167 lb (75.8 kg)  09/07/16 173 lb (78.5 kg)   Lab Results  Component Value Date   HGBA1C 7.6 01/10/2017   HGBA1C 8.6 (H) 08/31/2016   HGBA1C 8.0 06/01/2016   Lab Results  Component Value Date   MICROALBUR 1.4 08/31/2016   LDLCALC 63 01/10/2017   CREATININE 1.06 01/10/2017    OTHER active problems: See review of systems    No visits with results within 1 Day(s) from this visit.  Latest known visit with results is:  Erroneous Encounter on 01/10/2017  Component Date Value Ref  Range Status  . Sodium 01/10/2017 135  135 - 145 mEq/L Final  . Potassium 01/10/2017 4.4  3.5 - 5.1 mEq/L Final  . Chloride 01/10/2017 100  96 - 112 mEq/L Final  . CO2 01/10/2017 26  19 - 32 mEq/L Final  . Glucose, Bld 01/10/2017 200* 70 - 99 mg/dL Final  . BUN 01/10/2017 16  6 - 23 mg/dL Final  . Creatinine, Ser 01/10/2017 1.06  0.40 - 1.50 mg/dL Final  . Total Bilirubin 01/10/2017 0.8  0.2 - 1.2 mg/dL Final  . Alkaline Phosphatase 01/10/2017 65  39 - 117 U/L Final  . AST 01/10/2017 14  0 - 37 U/L Final  . ALT 01/10/2017 13  0 - 53 U/L Final  . Total Protein 01/10/2017 8.0  6.0 - 8.3 g/dL Final  . Albumin 01/10/2017 4.0  3.5 - 5.2 g/dL Final  . Calcium 01/10/2017 9.5  8.4 - 10.5 mg/dL Final  . GFR 01/10/2017 71.95  >60.00 mL/min Final  . Cholesterol  01/10/2017 114  0 - 200 mg/dL Final  . Triglycerides 01/10/2017 69.0  0.0 - 149.0 mg/dL Final  . HDL 01/10/2017 36.60* >39.00 mg/dL Final  . VLDL 01/10/2017 13.8  0.0 - 40.0 mg/dL Final  . LDL Cholesterol 01/10/2017 63  0 - 99 mg/dL Final  . Total CHOL/HDL Ratio 01/10/2017 3   Final  . NonHDL 01/10/2017 76.93   Final  . Hemoglobin A1C 01/10/2017 7.6   Final     Allergies as of 01/12/2017   No Known Allergies     Medication List       Accurate as of 01/12/17 12:11 PM. Always use your most recent med list.          ACCU-CHEK AVIVA PLUS test strip Generic drug:  glucose blood USE AS DIRECTED 3 TIMES A DAY   aspirin 81 MG tablet Take 81 mg by mouth daily.   atorvastatin 80 MG tablet Commonly known as:  LIPITOR Take 1 tablet (80 mg total) by mouth daily.   atorvastatin 80 MG tablet Commonly known as:  LIPITOR Take 1 tablet (80 mg total) by mouth daily.   calcium carbonate 600 MG Tabs tablet Commonly known as:  OS-CAL Take 1,200 mg by mouth daily with breakfast.   colesevelam 625 MG tablet Commonly known as:  WELCHOL Take 3 tablets twice a day   furosemide 40 MG tablet Commonly known as:  LASIX Take 40 mg by mouth daily.   gabapentin 300 MG capsule Commonly known as:  NEURONTIN Take 1 capsule (300 mg total) by mouth 3 (three) times daily.   insulin aspart 100 UNIT/ML injection Commonly known as:  novoLOG Inject 15-30-25 with meals,   insulin lispro 100 UNIT/ML injection Commonly known as:  HUMALOG Take before breakfast 10 units, lunch time 25 and dinner 30 units   insulin NPH Human 100 UNIT/ML injection Commonly known as:  NOVOLIN N In AM 25 units and bedtime 25 units.   insulin NPH Human 100 UNIT/ML injection Commonly known as:  NOVOLIN N RELION morning 35 units and bedtime 25 units   insulin regular 250 units/2.29mL (100 units/mL) injection Commonly known as:  NOVOLIN R 10 units at breakfast, 25 units at lunch, 25 units at dinner. 15 minutes before  meals.   isosorbide mononitrate 30 MG 24 hr tablet Commonly known as:  IMDUR Take 1 tablet (30 mg total) by mouth daily.   KLOR-CON 10 10 MEQ tablet Generic drug:  potassium chloride Take 10 mEq by mouth  daily.   memantine 10 MG tablet Commonly known as:  NAMENDA Take 10 mg by mouth daily.   metFORMIN 500 MG 24 hr tablet Commonly known as:  GLUCOPHAGE-XR TAKE 3 TABLETS BY MOUTH ONCE DAILY WITH SUPPER   metoprolol tartrate 12.5 mg Tabs tablet Commonly known as:  LOPRESSOR Take 0.5 tablets (12.5 mg total) by mouth 2 (two) times daily.   NESINA 25 MG Tabs Generic drug:  Alogliptin Benzoate Take 25 mg by mouth.   nitroGLYCERIN 0.4 MG SL tablet Commonly known as:  NITROSTAT Place 1 tablet (0.4 mg total) under the tongue every 5 (five) minutes x 3 doses as needed for chest pain.   ticagrelor 90 MG Tabs tablet Commonly known as:  BRILINTA Take 1 tablet (90 mg total) by mouth 2 (two) times daily.   valsartan 160 MG tablet Commonly known as:  DIOVAN TAKE 1 TABLET (160 MG TOTAL) BY MOUTH DAILY.   VICKS VAPORUB EX Apply 1 application topically daily as needed.       Allergies: No Known Allergies  Past Medical History:  Diagnosis Date  . Arthritis   . Coronary artery disease   . Diabetes mellitus    insulin dependent  . Dyslipidemia   . Hypertension     Past Surgical History:  Procedure Laterality Date  . CARDIAC CATHETERIZATION    . CORONARY ARTERY BYPASS GRAFT    . LEFT HEART CATHETERIZATION WITH CORONARY ANGIOGRAM N/A 09/15/2013   Procedure: LEFT HEART CATHETERIZATION WITH CORONARY ANGIOGRAM;  Surgeon: Clent Demark, MD;  Location: [place] CATH [facility];  Service: Cardiovascular;  Laterality: N/A;  . open heart surgery  1998  . PERCUTANEOUS STENT INTERVENTION  09/15/2013   Procedure: PERCUTANEOUS STENT INTERVENTION;  Surgeon: Clent Demark, MD;  Location: [place] CATH LAB;  Service: Cardiovascular;;    No family history on file.  Social History:  reports that he has  never smoked. He has never used smokeless tobacco. He reports that he does not drink alcohol or use drugs.    Review of Systems .   Cardiovascular ROS: positive for - coronary artery disease, cerebrovascular disease  HYPERTENSION:    He is on Diovan, continues to take metoprolol  HYPERLIPIDEMIA:         The lipid abnormality consists of elevated LDL and this has been generally difficult to control with various drugs in the past probably because of noncompliance with medications and financial reasons.  With going back on Lipitor his LDL is back below 70    Lab Results  Component Value Date   CHOL 114 01/10/2017   HDL 36.60 (L) 01/10/2017   LDLCALC 63 01/10/2017   LDLDIRECT 176.0 08/31/2016   TRIG 69.0 01/10/2017   CHOLHDL 3 01/10/2017    More recently has had problems with Left foot pain for about a week He thinks he has got but he has symptoms in the proximal part of his foot near the ankle He has scheduled follow-up with PCP  Had been given gabapentin for neuropathic symptoms also   Examination:   BP 130/68   Pulse 68   Ht 5\' 4"  (1.626 m)   Wt 169 lb (76.7 kg)   BMI 29.01 kg/m   Body mass index is 29.01 kg/m.    Assesment/PLAN:   1. Diabetes type 2, uncontrolled See history of present illness for detailed discussion of current diabetes management, blood sugar patterns and problems identified  He was seen in short-term follow-up today because of difficulty getting an accurate history  of  medications, insulin and compliance with daily regimen Even that he wants to stop taking Lantus because of cost he still did not pick up the prescription of the NPH that was given and again complaining about the cost Blood sugar control is fair, likely has high postprandial readings after breakfast and supper which he is not monitoring Also does not understand which insulin he needs to take, taking both NovoLog and Humalog at lunch and supper  Recommendations made today:  Start  monitoring blood sugars by rotation at different times and not just in the morning  He will need to take his NPH consistently at bedtime instead of randomly, to start with 10 units  He does need to adjust his suppertime dose if his blood sugars after supper are more than 180  He needs to cover his breakfast with 10 units of regular insulin  Will need to adjust his insulin doses further based on  patterns on his download on the next visit Currently no changes in insulin doses recommended  2. HYPERCHOLESTEROLEMIA: He has better control with the resuming Lipitor  Hypertension: Currently on Diovan and metoprolol and blood pressure is controlled  Left foot pain: We will check uric acid  Patient Instructions  Check blood sugars on waking up  3x weekly  Also check blood sugars about 2-3 hours after a meal and do this after different meals by rotation  Recommended blood sugar levels on waking up is 90-130 and about 2 hours after meal is 130-160  Please bring your blood sugar monitor to each visit, thank you  NOVOLIN N INSULIN 45 IN AM AND 10 UNITS AT BEDTIME  REGULAR INSULIN 10 units at breakfast, 30 at lujnch nand 25 at dinner  If sugar high at bedtime take 30 units at dinner     Lakela Kuba 01/12/2017, 12:11 PM

## 2017-02-27 ENCOUNTER — Other Ambulatory Visit: Payer: Self-pay | Admitting: Endocrinology

## 2017-03-31 ENCOUNTER — Other Ambulatory Visit: Payer: Self-pay | Admitting: Endocrinology

## 2017-04-07 ENCOUNTER — Emergency Department (HOSPITAL_COMMUNITY): Payer: Medicare HMO

## 2017-04-07 ENCOUNTER — Inpatient Hospital Stay (HOSPITAL_COMMUNITY)
Admission: EM | Admit: 2017-04-07 | Discharge: 2017-04-14 | DRG: 644 | Disposition: A | Discharge status: Home Health | Payer: Medicare HMO | Attending: Cardiology | Admitting: Cardiology

## 2017-04-07 ENCOUNTER — Encounter (HOSPITAL_COMMUNITY): Payer: Self-pay | Admitting: Emergency Medicine

## 2017-04-07 DIAGNOSIS — E785 Hyperlipidemia, unspecified: Secondary | ICD-10-CM | POA: Diagnosis present

## 2017-04-07 DIAGNOSIS — I252 Old myocardial infarction: Secondary | ICD-10-CM

## 2017-04-07 DIAGNOSIS — I25119 Atherosclerotic heart disease of native coronary artery with unspecified angina pectoris: Secondary | ICD-10-CM | POA: Diagnosis present

## 2017-04-07 DIAGNOSIS — R590 Localized enlarged lymph nodes: Secondary | ICD-10-CM | POA: Diagnosis present

## 2017-04-07 DIAGNOSIS — D638 Anemia in other chronic diseases classified elsewhere: Secondary | ICD-10-CM | POA: Diagnosis present

## 2017-04-07 DIAGNOSIS — K648 Other hemorrhoids: Secondary | ICD-10-CM | POA: Diagnosis present

## 2017-04-07 DIAGNOSIS — E871 Hypo-osmolality and hyponatremia: Secondary | ICD-10-CM

## 2017-04-07 DIAGNOSIS — E1165 Type 2 diabetes mellitus with hyperglycemia: Secondary | ICD-10-CM | POA: Diagnosis present

## 2017-04-07 DIAGNOSIS — C679 Malignant neoplasm of bladder, unspecified: Secondary | ICD-10-CM | POA: Diagnosis present

## 2017-04-07 DIAGNOSIS — N329 Bladder disorder, unspecified: Secondary | ICD-10-CM | POA: Diagnosis present

## 2017-04-07 DIAGNOSIS — Z7902 Long term (current) use of antithrombotics/antiplatelets: Secondary | ICD-10-CM

## 2017-04-07 DIAGNOSIS — Z7982 Long term (current) use of aspirin: Secondary | ICD-10-CM | POA: Diagnosis not present

## 2017-04-07 DIAGNOSIS — I110 Hypertensive heart disease with heart failure: Secondary | ICD-10-CM | POA: Diagnosis present

## 2017-04-07 DIAGNOSIS — Z955 Presence of coronary angioplasty implant and graft: Secondary | ICD-10-CM | POA: Diagnosis not present

## 2017-04-07 DIAGNOSIS — I69351 Hemiplegia and hemiparesis following cerebral infarction affecting right dominant side: Secondary | ICD-10-CM

## 2017-04-07 DIAGNOSIS — R1900 Intra-abdominal and pelvic swelling, mass and lump, unspecified site: Secondary | ICD-10-CM | POA: Diagnosis present

## 2017-04-07 DIAGNOSIS — R531 Weakness: Secondary | ICD-10-CM

## 2017-04-07 DIAGNOSIS — M1990 Unspecified osteoarthritis, unspecified site: Secondary | ICD-10-CM | POA: Diagnosis present

## 2017-04-07 DIAGNOSIS — R296 Repeated falls: Secondary | ICD-10-CM | POA: Diagnosis present

## 2017-04-07 DIAGNOSIS — Z87891 Personal history of nicotine dependence: Secondary | ICD-10-CM

## 2017-04-07 DIAGNOSIS — I5022 Chronic systolic (congestive) heart failure: Secondary | ICD-10-CM | POA: Diagnosis present

## 2017-04-07 DIAGNOSIS — Z794 Long term (current) use of insulin: Secondary | ICD-10-CM | POA: Diagnosis not present

## 2017-04-07 DIAGNOSIS — Z951 Presence of aortocoronary bypass graft: Secondary | ICD-10-CM | POA: Diagnosis not present

## 2017-04-07 DIAGNOSIS — R630 Anorexia: Secondary | ICD-10-CM

## 2017-04-07 DIAGNOSIS — E222 Syndrome of inappropriate secretion of antidiuretic hormone: Principal | ICD-10-CM | POA: Diagnosis present

## 2017-04-07 DIAGNOSIS — Z79899 Other long term (current) drug therapy: Secondary | ICD-10-CM

## 2017-04-07 DIAGNOSIS — D649 Anemia, unspecified: Secondary | ICD-10-CM

## 2017-04-07 LAB — CBC
HEMATOCRIT: 31.9 % — AB (ref 39.0–52.0)
HEMOGLOBIN: 10.8 g/dL — AB (ref 13.0–17.0)
MCH: 26.5 pg (ref 26.0–34.0)
MCHC: 33.9 g/dL (ref 30.0–36.0)
MCV: 78.4 fL (ref 78.0–100.0)
Platelets: 433 10*3/uL — ABNORMAL HIGH (ref 150–400)
RBC: 4.07 MIL/uL — ABNORMAL LOW (ref 4.22–5.81)
RDW: 14.9 % (ref 11.5–15.5)
WBC: 8.3 10*3/uL (ref 4.0–10.5)

## 2017-04-07 LAB — DIFFERENTIAL
BASOS ABS: 0 10*3/uL (ref 0.0–0.1)
BASOS PCT: 0 %
Eosinophils Absolute: 0.1 10*3/uL (ref 0.0–0.7)
Eosinophils Relative: 1 %
Lymphocytes Relative: 14 %
Lymphs Abs: 1.2 10*3/uL (ref 0.7–4.0)
MONOS PCT: 11 %
Monocytes Absolute: 0.9 10*3/uL (ref 0.1–1.0)
NEUTROS ABS: 6.1 10*3/uL (ref 1.7–7.7)
NEUTROS PCT: 74 %

## 2017-04-07 LAB — CBG MONITORING, ED
GLUCOSE-CAPILLARY: 280 mg/dL — AB (ref 65–99)
Glucose-Capillary: 309 mg/dL — ABNORMAL HIGH (ref 65–99)

## 2017-04-07 LAB — I-STAT CHEM 8, ED
BUN: 9 mg/dL (ref 6–20)
CHLORIDE: 81 mmol/L — AB (ref 101–111)
Calcium, Ion: 1.1 mmol/L — ABNORMAL LOW (ref 1.15–1.40)
Creatinine, Ser: 0.6 mg/dL — ABNORMAL LOW (ref 0.61–1.24)
Glucose, Bld: 288 mg/dL — ABNORMAL HIGH (ref 65–99)
HEMATOCRIT: 35 % — AB (ref 39.0–52.0)
Hemoglobin: 11.9 g/dL — ABNORMAL LOW (ref 13.0–17.0)
POTASSIUM: 5.3 mmol/L — AB (ref 3.5–5.1)
SODIUM: 114 mmol/L — AB (ref 135–145)
TCO2: 23 mmol/L (ref 0–100)

## 2017-04-07 LAB — COMPREHENSIVE METABOLIC PANEL
ALBUMIN: 2.8 g/dL — AB (ref 3.5–5.0)
ALT: 93 U/L — AB (ref 17–63)
AST: 83 U/L — AB (ref 15–41)
Alkaline Phosphatase: 167 U/L — ABNORMAL HIGH (ref 38–126)
Anion gap: 6 (ref 5–15)
BUN: 7 mg/dL (ref 6–20)
CHLORIDE: 82 mmol/L — AB (ref 101–111)
CO2: 24 mmol/L (ref 22–32)
Calcium: 8.4 mg/dL — ABNORMAL LOW (ref 8.9–10.3)
Creatinine, Ser: 0.79 mg/dL (ref 0.61–1.24)
GFR calc Af Amer: >60 mL/min (ref >60)
GLUCOSE: 282 mg/dL — AB (ref 65–99)
POTASSIUM: 5.3 mmol/L — AB (ref 3.5–5.1)
Sodium: 112 mmol/L — CL (ref 135–145)
Total Bilirubin: 0.8 mg/dL (ref 0.3–1.2)
Total Protein: 7.3 g/dL (ref 6.5–8.1)

## 2017-04-07 LAB — I-STAT TROPONIN, ED: TROPONIN I, POC: 0 ng/mL (ref 0.00–0.08)

## 2017-04-07 LAB — PROTIME-INR
INR: 1.1
Prothrombin Time: 14.3 seconds (ref 11.4–15.2)

## 2017-04-07 LAB — APTT: APTT: 45 s — AB (ref 24–36)

## 2017-04-07 MED ORDER — NITROGLYCERIN 0.4 MG SL SUBL: 0.4000 mg | SUBLINGUAL_TABLET | SUBLINGUAL | Status: DC | PRN

## 2017-04-07 MED ORDER — ENOXAPARIN SODIUM 40 MG/0.4ML ~~LOC~~ SOLN
40.0000 mg | Freq: Every day | SUBCUTANEOUS | Status: DC
  Administered 2017-04-08 – 2017-04-14 (×7): 40 mg via SUBCUTANEOUS
  Filled 2017-04-07 (×7): qty 0.4

## 2017-04-07 MED ORDER — METOPROLOL TARTRATE 12.5 MG HALF TABLET
12.5000 mg | ORAL_TABLET | Freq: Two times a day (BID) | ORAL | Status: DC
  Administered 2017-04-08 (×2): 12.5 mg via ORAL
  Filled 2017-04-07 (×2): qty 1

## 2017-04-07 MED ORDER — INSULIN GLARGINE 100 UNIT/ML ~~LOC~~ SOLN
20.0000 [IU] | Freq: Every day | SUBCUTANEOUS | Status: DC
  Administered 2017-04-08: 20 [IU] via SUBCUTANEOUS
  Filled 2017-04-07: qty 0.2

## 2017-04-07 MED ORDER — ATORVASTATIN CALCIUM 40 MG PO TABS
40.0000 mg | ORAL_TABLET | Freq: Every day | ORAL | Status: DC
  Administered 2017-04-08 – 2017-04-13 (×6): 40 mg via ORAL
  Filled 2017-04-07 (×6): qty 1

## 2017-04-07 MED ORDER — SODIUM CHLORIDE 0.9 % IV SOLN
INTRAVENOUS | Status: DC
  Administered 2017-04-08 – 2017-04-13 (×4): via INTRAVENOUS

## 2017-04-07 MED ORDER — ASPIRIN EC 81 MG PO TBEC
81.0000 mg | DELAYED_RELEASE_TABLET | Freq: Every day | ORAL | Status: DC
  Administered 2017-04-08 – 2017-04-14 (×7): 81 mg via ORAL
  Filled 2017-04-07 (×7): qty 1

## 2017-04-07 MED ORDER — ISOSORBIDE MONONITRATE ER 30 MG PO TB24
30.0000 mg | ORAL_TABLET | Freq: Every day | ORAL | Status: DC
  Administered 2017-04-08 – 2017-04-14 (×7): 30 mg via ORAL
  Filled 2017-04-07 (×7): qty 1

## 2017-04-07 MED ORDER — INSULIN ASPART 100 UNIT/ML ~~LOC~~ SOLN
0.0000 [IU] | Freq: Three times a day (TID) | SUBCUTANEOUS | Status: DC
  Administered 2017-04-08: 5 [IU] via SUBCUTANEOUS
  Administered 2017-04-08: 9 [IU] via SUBCUTANEOUS
  Administered 2017-04-08: 5 [IU] via SUBCUTANEOUS
  Administered 2017-04-09: 2 [IU] via SUBCUTANEOUS
  Administered 2017-04-09 (×2): 3 [IU] via SUBCUTANEOUS
  Administered 2017-04-10: 1 [IU] via SUBCUTANEOUS
  Administered 2017-04-10 – 2017-04-11 (×3): 3 [IU] via SUBCUTANEOUS
  Administered 2017-04-11: 2 [IU] via SUBCUTANEOUS
  Administered 2017-04-12 (×2): 3 [IU] via SUBCUTANEOUS
  Administered 2017-04-13: 7 [IU] via SUBCUTANEOUS
  Administered 2017-04-13 – 2017-04-14 (×2): 2 [IU] via SUBCUTANEOUS

## 2017-04-07 MED ORDER — TICAGRELOR 90 MG PO TABS
90.0000 mg | ORAL_TABLET | Freq: Two times a day (BID) | ORAL | Status: DC
  Administered 2017-04-08 – 2017-04-11 (×8): 90 mg via ORAL
  Filled 2017-04-07 (×9): qty 1

## 2017-04-07 NOTE — H&P (Signed)
Calvin Perez is an 76 y.o. male.   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 Workup in the ED showed severe hyponatremia. Patient denies any diarrhea. States his appetite has been very poor for last few days. Denies any chest pain or shortness of breath. Denies palpitation lightheadedness or syncope.   Past Medical History:  Diagnosis Date  . Arthritis   . Coronary artery disease   . Diabetes mellitus    insulin dependent  . Dyslipidemia   . Hypertension     Past Surgical History:  Procedure Laterality Date  . CARDIAC CATHETERIZATION    . CORONARY ARTERY BYPASS GRAFT    . LEFT HEART CATHETERIZATION WITH CORONARY ANGIOGRAM N/A 09/15/2013   Procedure: LEFT HEART CATHETERIZATION WITH CORONARY ANGIOGRAM;  Surgeon: Robynn Pane, MD;  Location: [facility]ATH [facility];  Service: Cardiovascular;  Laterality: N/A;  . open heart surgery  1998  . PERCUTANEOUS STENT INTERVENTION  09/15/2013   Procedure: PERCUTANEOUS STENT INTERVENTION;  Surgeon: Robynn Pane, MD;  Location: [facility]ATH [facility];  Service: Cardiovascular;;    No family history on file. Social History:  reports that he has never smoked. He has never used smokeless tobacco. He reports that he does not drink alcohol or use drugs.  Allergies: No Known Allergies   (Not in a hospital admission)  Results for orders placed or performed during  the hospital encounter of 04/07/17 (from the past 48 hour(s))  Protime-INR     Status: None   Collection Time: 04/07/17  5:32 PM  Result Value Ref Range   Prothrombin Time 14.3 11.4 - 15.2 seconds   INR 1.10   APTT     Status: Abnormal   Collection Time: 04/07/17  5:32 PM  Result Value Ref Range   aPTT 45 (H) 24 - 36 seconds    Comment:        IF BASELINE aPTT IS ELEVATED, SUGGEST PATIENT RISK ASSESSMENT BE USED TO DETERMINE APPROPRIATE ANTICOAGULANT THERAPY.   CBC     Status: Abnormal   Collection Time: 04/07/17  5:32 PM  Result Value Ref Range   WBC 8.3 4.0 - 10.5 K/uL   RBC 4.07 (L) 4.22 - 5.81 MIL/uL   Hemoglobin 10.8 (L) 13.0 - 17.0 g/dL   HCT 14.6 (L) 19.6 - 89.1 %   MCV 78.4 78.0 - 100.0 fL   MCH 26.5 26.0 - 34.0 pg   MCHC 33.9 30.0 - 36.0 g/dL   RDW 01.9 19.9 - 18.1 %   Platelets 433 (H) 150 - 400 K/uL  Differential     Status: None   Collection Time: 04/07/17  5:32 PM  Result Value Ref Range   Neutrophils Relative % 74 %   Neutro Abs 6.1 1.7 - 7.7 K/uL   Lymphocytes Relative 14 %   Lymphs Abs 1.2 0.7 - 4.0 K/uL   Monocytes Relative 11 %   Monocytes Absolute 0.9 0.1 - 1.0 K/uL  Eosinophils Relative 1 %   Eosinophils Absolute 0.1 0.0 - 0.7 K/uL   Basophils Relative 0 %   Basophils Absolute 0.0 0.0 - 0.1 K/uL  Comprehensive metabolic panel     Status: Abnormal   Collection Time: 04/07/17  5:32 PM  Result Value Ref Range   Sodium 112 (LL) 135 - 145 mmol/L    Comment: CRITICAL RESULT CALLED TO, READ BACK BY AND VERIFIED WITH: [facility] [id number] MCCAULEG    Potassium 5.3 (H) 3.5 - 5.1 mmol/L   Chloride 82 (L) 101 - 111 mmol/L   CO2 24 22 - 32 mmol/L   Glucose, Bld 282 (H) 65 - 99 mg/dL   BUN 7 6 - 20 mg/dL   Creatinine, Ser 0.79 0.61 - 1.24 mg/dL   Calcium 8.4 (L) 8.9 - 10.3 mg/dL   Total Protein 7.3 6.5 - 8.1 g/dL   Albumin 2.8 (L) 3.5 - 5.0 g/dL   AST 83 (H) 15 - 41 U/L   ALT 93 (H) 17 - 63 U/L   Alkaline Phosphatase 167 (H) 38 - 126 U/L   Total  Bilirubin 0.8 0.3 - 1.2 mg/dL   GFR calc non Af Amer >60 >60 mL/min   GFR calc Af Amer >60 >60 mL/min    Comment: (NOTE) The eGFR has been calculated using the CKD EPI equation. This calculation has not been validated in all clinical situations. eGFR's persistently <60 mL/min signify possible Chronic Kidney Disease.    Anion gap 6 5 - 15  CBG monitoring, ED     Status: Abnormal   Collection Time: 04/07/17  6:20 PM  Result Value Ref Range   Glucose-Capillary 309 (H) 65 - 99 mg/dL  I-stat troponin, ED     Status: None   Collection Time: 04/07/17  6:57 PM  Result Value Ref Range   Troponin i, poc 0.00 0.00 - 0.08 ng/mL   Comment 3            Comment: Due to the release kinetics of cTnI, a negative result within the first hours of the onset of symptoms does not rule out myocardial infarction with certainty. If myocardial infarction is still suspected, repeat the test at appropriate intervals.   I-Stat Chem 8, ED     Status: Abnormal   Collection Time: 04/07/17  6:58 PM  Result Value Ref Range   Sodium 114 (LL) 135 - 145 mmol/L   Potassium 5.3 (H) 3.5 - 5.1 mmol/L   Chloride 81 (L) 101 - 111 mmol/L   BUN 9 6 - 20 mg/dL   Creatinine, Ser 0.60 (L) 0.61 - 1.24 mg/dL   Glucose, Bld 288 (H) 65 - 99 mg/dL   Calcium, Ion 1.10 (L) 1.15 - 1.40 mmol/L   TCO2 23 0 - 100 mmol/L   Hemoglobin 11.9 (L) 13.0 - 17.0 g/dL   HCT 35.0 (L) 39.0 - 52.0 %   Comment NOTIFIED PHYSICIAN   CBG monitoring, ED     Status: Abnormal   Collection Time: 04/07/17  9:25 PM  Result Value Ref Range   Glucose-Capillary 280 (H) 65 - 99 mg/dL   Ct Head Wo Contrast  Result Date: 04/07/2017 CLINICAL DATA:  Right lower extremity numbness and tingling EXAM: CT HEAD WITHOUT CONTRAST TECHNIQUE: Contiguous axial images were obtained from the base of the skull through the vertex without intravenous contrast. COMPARISON:  Brain MRI Feb 01, 2012; brain MRI January 14, 2009; head CT January 13, 2009 FINDINGS: Brain: There is  moderate diffuse atrophy.  There is no apparent intracranial mass, hemorrhage, extra-axial fluid collection, or midline shift. There is evidence of what appears to be prior infarct in the left medial occipital lobe, not present on most recent available studies. There is evidence of a prior infarct in the right thalamus. A smaller infarct is noted in the left thalamus. Elsewhere, there is small vessel disease throughout the centra semiovale bilaterally. There is no appreciable acute infarct on the current examination. Vascular: There is no appreciable hyperdense vessel. There is calcification in each carotid siphon. There is also calcification in each distal vertebral artery. Skull: The bony calvarium appears intact. Sinuses/Orbits: There is mucosal thickening in multiple ethmoid air cells bilaterally. Visualized paranasal sinuses elsewhere clear. Orbits appear symmetric bilaterally. Other: There is chronic mastoid air cell thickening bilaterally. IMPRESSION: Atrophy with periventricular small vessel disease. Old appearing infarct in the medial left occipital lobe, not present on most recent prior studies. Prior appearing small infarcts in each thalamus. No acute infarct is demonstrable by CT. No mass, hemorrhage, or extra-axial fluid collection. There are multiple foci of arterial vascular calcification. There is ethmoid sinus disease bilaterally. There is chronic mastoid air cell thickening bilaterally. Electronically Signed   By: Lowella Grip III M.D.   On: 04/07/2017 18:52   Dg Knee Complete 4 Views Right  Result Date: 04/07/2017 CLINICAL DATA:  Pain with recent history of falls. EXAM: RIGHT KNEE - COMPLETE 4+ VIEW COMPARISON:  None. FINDINGS: Vascular calcifications are identified. No fracture or joint effusion. No dislocation. IMPRESSION: Negative. Electronically Signed   By: Dorise Bullion III M.D   On: 04/07/2017 18:20    Review of Systems  Constitutional: Positive for malaise/fatigue.   Respiratory: Negative for cough and shortness of breath.   Cardiovascular: Negative for chest pain, palpitations, orthopnea and leg swelling.  Gastrointestinal: Positive for nausea. Negative for diarrhea.  Genitourinary: Negative for dysuria.  Neurological: Positive for weakness. Negative for seizures.    Blood pressure (!) 156/76, pulse 81, temperature 98.4 F (36.9 C), resp. rate (!) 21, SpO2 99 %. Physical Exam  Eyes: Conjunctivae are normal. Pupils are equal, round, and reactive to light.  Neck: Normal range of motion. Neck supple. No tracheal deviation present. No thyromegaly present.  Cardiovascular: Normal rate and regular rhythm.   Murmur (Soft systolic murmur noted no S3 gallop) heard. Respiratory: Effort normal and breath sounds normal. No respiratory distress. He has no wheezes. He has no rales.  GI: Soft. Bowel sounds are normal. He exhibits no distension. There is no tenderness. There is no rebound.  Musculoskeletal: He exhibits no edema, tenderness or deformity.  Neurological: He is alert.     Assessment/Plan Symptomatic hyponatremia Generalized weakness and malaise and cramps secondary to above Coronary artery disease status post CABG in the past History of inferior wall myocardial infarction status post PCI to RCA in the past Hypertension Uncontrolled diabetes mellitus Hyperlipidemia History of CVA Anemia of chronic disease Degenerative joint disease Plan As per orders   Charolette Forward, MD 04/07/2017, 10:21 PM

## 2017-04-07 NOTE — ED Notes (Signed)
Fall risk bracelet and socks placed on patient, fall risk magnet hanging at door.

## 2017-04-07 NOTE — ED Notes (Signed)
EDP states okay for patient to eat/drink. Passed SSS - family going to get him food at this time.

## 2017-04-07 NOTE — ED Notes (Signed)
Patient's daughter, [name], may be reached at [phone].

## 2017-04-07 NOTE — ED Notes (Signed)
CBG 309; RN notified

## 2017-04-07 NOTE — ED Triage Notes (Signed)
Per [facility]: Pt to ED from home c/o new numbness/tingling in R leg and R knee pain. Patient hx CVA with R sided deficits - per EMS, family stated that pt's weakness in legs has gradually increased, resulting in multiple falls at home. Pt reports pain in his R knee and back, ambulates with walker. EMS VS: 112/78, P 64 regular, R 18, CBG 364 - has not been eating much but takes insulin regularly. Pt currently A&O x 4. No focal neuro symptoms noted, no facial droop, grip strength equal and strong, no drift noted in extremities, but pt states R leg is heavy.

## 2017-04-08 ENCOUNTER — Encounter (HOSPITAL_COMMUNITY): Payer: Self-pay | Admitting: General Practice

## 2017-04-08 LAB — BASIC METABOLIC PANEL
ANION GAP: 6 (ref 5–15)
Anion gap: 8 (ref 5–15)
BUN: 9 mg/dL (ref 6–20)
BUN: 9 mg/dL (ref 6–20)
CHLORIDE: 81 mmol/L — AB (ref 101–111)
CHLORIDE: 85 mmol/L — AB (ref 101–111)
CO2: 22 mmol/L (ref 22–32)
CO2: 24 mmol/L (ref 22–32)
CREATININE: 0.91 mg/dL (ref 0.61–1.24)
CREATININE: 0.81 mg/dL (ref 0.61–1.24)
Calcium: 8.4 mg/dL — ABNORMAL LOW (ref 8.9–10.3)
Calcium: 8.2 mg/dL — ABNORMAL LOW (ref 8.9–10.3)
GFR calc Af Amer: >60 mL/min (ref >60)
GFR calc non Af Amer: >60 mL/min (ref >60)
GFR calc non Af Amer: >60 mL/min (ref >60)
GLUCOSE: 338 mg/dL — AB (ref 65–99)
Glucose, Bld: 333 mg/dL — ABNORMAL HIGH (ref 65–99)
POTASSIUM: 4.9 mmol/L (ref 3.5–5.1)
Potassium: 4.7 mmol/L (ref 3.5–5.1)
SODIUM: 115 mmol/L — AB (ref 135–145)
Sodium: 111 mmol/L — CL (ref 135–145)

## 2017-04-08 LAB — COMPREHENSIVE METABOLIC PANEL
ALT: 86 U/L — AB (ref 17–63)
AST: 77 U/L — ABNORMAL HIGH (ref 15–41)
Albumin: 2.7 g/dL — ABNORMAL LOW (ref 3.5–5.0)
Alkaline Phosphatase: 155 U/L — ABNORMAL HIGH (ref 38–126)
Anion gap: 10 (ref 5–15)
BUN: 7 mg/dL (ref 6–20)
CHLORIDE: 81 mmol/L — AB (ref 101–111)
CO2: 21 mmol/L — AB (ref 22–32)
Calcium: 8.3 mg/dL — ABNORMAL LOW (ref 8.9–10.3)
Creatinine, Ser: 0.91 mg/dL (ref 0.61–1.24)
Glucose, Bld: 332 mg/dL — ABNORMAL HIGH (ref 65–99)
POTASSIUM: 4.8 mmol/L (ref 3.5–5.1)
SODIUM: 112 mmol/L — AB (ref 135–145)
Total Bilirubin: 0.5 mg/dL (ref 0.3–1.2)
Total Protein: 7 g/dL (ref 6.5–8.1)

## 2017-04-08 LAB — LIPID PANEL
CHOL/HDL RATIO: 4.1 ratio
CHOLESTEROL: 102 mg/dL (ref 0–200)
HDL: 25 mg/dL — AB (ref >40)
LDL Cholesterol: 61 mg/dL (ref 0–99)
Triglycerides: 80 mg/dL (ref <150)
VLDL: 16 mg/dL (ref 0–40)

## 2017-04-08 LAB — GLUCOSE, CAPILLARY
GLUCOSE-CAPILLARY: 253 mg/dL — AB (ref 65–99)
GLUCOSE-CAPILLARY: 278 mg/dL — AB (ref 65–99)
GLUCOSE-CAPILLARY: 280 mg/dL — AB (ref 65–99)
GLUCOSE-CAPILLARY: 292 mg/dL — AB (ref 65–99)
GLUCOSE-CAPILLARY: 363 mg/dL — AB (ref 65–99)

## 2017-04-08 LAB — CBC
HCT: 31.2 % — ABNORMAL LOW (ref 39.0–52.0)
HEMOGLOBIN: 10.3 g/dL — AB (ref 13.0–17.0)
MCH: 25.9 pg — AB (ref 26.0–34.0)
MCHC: 33 g/dL (ref 30.0–36.0)
MCV: 78.6 fL (ref 78.0–100.0)
Platelets: 407 10*3/uL — ABNORMAL HIGH (ref 150–400)
RBC: 3.97 MIL/uL — ABNORMAL LOW (ref 4.22–5.81)
RDW: 14.8 % (ref 11.5–15.5)
WBC: 7.9 10*3/uL (ref 4.0–10.5)

## 2017-04-08 LAB — OSMOLALITY: OSMOLALITY: 258 mosm/kg — AB (ref 275–295)

## 2017-04-08 LAB — NA AND K (SODIUM & POTASSIUM), RAND UR
Potassium Urine: 27 mmol/L
SODIUM UR: 62 mmol/L

## 2017-04-08 LAB — OSMOLALITY, URINE: OSMOLALITY UR: 402 mosm/kg (ref 300–900)

## 2017-04-08 LAB — MAGNESIUM: Magnesium: 1.8 mg/dL (ref 1.7–2.4)

## 2017-04-08 LAB — TSH: TSH: 1.02 u[IU]/mL (ref 0.350–4.500)

## 2017-04-08 MED ORDER — LOSARTAN POTASSIUM 50 MG PO TABS
25.0000 mg | ORAL_TABLET | Freq: Every day | ORAL | Status: DC
  Administered 2017-04-08 – 2017-04-11 (×4): 25 mg via ORAL
  Filled 2017-04-08 (×4): qty 1

## 2017-04-08 MED ORDER — METOPROLOL TARTRATE 25 MG PO TABS
25.0000 mg | ORAL_TABLET | Freq: Two times a day (BID) | ORAL | Status: DC
  Administered 2017-04-08 – 2017-04-14 (×12): 25 mg via ORAL
  Filled 2017-04-08 (×12): qty 1

## 2017-04-08 MED ORDER — INSULIN GLARGINE 100 UNIT/ML ~~LOC~~ SOLN
30.0000 [IU] | Freq: Every day | SUBCUTANEOUS | Status: DC
  Administered 2017-04-08 – 2017-04-13 (×6): 30 [IU] via SUBCUTANEOUS
  Filled 2017-04-08 (×6): qty 0.3

## 2017-04-08 NOTE — Progress Notes (Signed)
Subjective:  Patient denies any chest pain. Gives history of exertional dyspnea. Activity Limited. States overall feeling better. Sodium slowly improving.  Objective:  Vital Signs in the last 24 hours: Temp:  [97.9 F (36.6 C)-98.4 F (36.9 C)] 97.9 F (36.6 C) (07/08 0547) Pulse Rate:  [73-96] 78 (07/08 0825) Resp:  [15-23] 20 (07/08 0547) BP: (145-178)/(65-85) 160/74 (07/08 0547) SpO2:  [95 %-100 %] 100 % (07/08 0547)  Intake/Output from previous day: 07/07 0701 - 07/08 0700 In: 239.2 [I.V.:239.2] Out: 225 [Urine:225] Intake/Output from this shift: Total I/O In: -  Out: 150 [Urine:150]  Physical Exam: Neck: no adenopathy, no carotid bruit, no JVD and supple, symmetrical, trachea midline Lungs: clear to auscultation bilaterally Heart: regular rate and rhythm, S1, S2 normal and 2/6 systolic murmur noted Abdomen: soft, non-tender; bowel sounds normal; no masses,  no organomegaly Extremities: extremities normal, atraumatic, no cyanosis or edema  Lab Results:  Recent Labs  04/07/17 1732 04/07/17 1858 04/08/17 0124  WBC 8.3  --  7.9  HGB 10.8* 11.9* 10.3*  PLT 433*  --  407*    Recent Labs  04/08/17 0124 04/08/17 1045  NA 111* 115*  K 4.7 4.9  CL 81* 85*  CO2 22 24  GLUCOSE 338* 333*  BUN 9 9  CREATININE 0.91 0.81   No results for input(s): TROPONINI in the last 72 hours.  Invalid input(s): CK, MB Hepatic Function Panel  Recent Labs  04/08/17 0000  PROT 7.0  ALBUMIN 2.7*  AST 77*  ALT 86*  ALKPHOS 155*  BILITOT 0.5    Recent Labs  04/08/17 0124  CHOL 102   No results for input(s): PROTIME in the last 72 hours.  Imaging: Imaging results have been reviewed and Ct Head Wo Contrast  Result Date: 04/07/2017 CLINICAL DATA:  Right lower extremity numbness and tingling EXAM: CT HEAD WITHOUT CONTRAST TECHNIQUE: Contiguous axial images were obtained from the base of the skull through the vertex without intravenous contrast. COMPARISON:  Brain MRI Feb 01, 2012; brain MRI January 14, 2009; head CT January 13, 2009 FINDINGS: Brain: There is moderate diffuse atrophy. There is no apparent intracranial mass, hemorrhage, extra-axial fluid collection, or midline shift. There is evidence of what appears to be prior infarct in the left medial occipital lobe, not present on most recent available studies. There is evidence of a prior infarct in the right thalamus. A smaller infarct is noted in the left thalamus. Elsewhere, there is small vessel disease throughout the centra semiovale bilaterally. There is no appreciable acute infarct on the current examination. Vascular: There is no appreciable hyperdense vessel. There is calcification in each carotid siphon. There is also calcification in each distal vertebral artery. Skull: The bony calvarium appears intact. Sinuses/Orbits: There is mucosal thickening in multiple ethmoid air cells bilaterally. Visualized paranasal sinuses elsewhere clear. Orbits appear symmetric bilaterally. Other: There is chronic mastoid air cell thickening bilaterally. IMPRESSION: Atrophy with periventricular small vessel disease. Old appearing infarct in the medial left occipital lobe, not present on most recent prior studies. Prior appearing small infarcts in each thalamus. No acute infarct is demonstrable by CT. No mass, hemorrhage, or extra-axial fluid collection. There are multiple foci of arterial vascular calcification. There is ethmoid sinus disease bilaterally. There is chronic mastoid air cell thickening bilaterally. Electronically Signed   By: Lowella Grip III M.D.   On: 04/07/2017 18:52   Dg Knee Complete 4 Views Right  Result Date: 04/07/2017 CLINICAL DATA:  Pain with recent history of  falls. EXAM: RIGHT KNEE - COMPLETE 4+ VIEW COMPARISON:  None. FINDINGS: Vascular calcifications are identified. No fracture or joint effusion. No dislocation. IMPRESSION: Negative. Electronically Signed   By: Dorise Bullion III M.D   On: 04/07/2017 18:20     Cardiac Studies:  Assessment/Plan:  Symptomatic hypoosmolar hyponatremia Generalized weakness and malaise and cramps secondary to above Exertional dyspnea probably angina equalant Coronary artery disease status post CABG in the past History of inferior wall myocardial infarction status post PCI to RCA in the past Hypertension Uncontrolled diabetes mellitus Hyperlipidemia History of CVA Anemia of chronic disease Degenerative joint disease Plan Increase beta blockers as per orders Add low-dose ARB Check labs in a.m.  LOS: 1 day    Charolette Forward 04/08/2017, 1:57 PM

## 2017-04-08 NOTE — ED Provider Notes (Signed)
[facility] DEPT Provider Note   CSN: [id number] Arrival date & time: 04/07/17  1716     History   Chief Complaint Chief Complaint  Patient presents with  . Numbness  . Knee Pain    HPI Calvin Perez is a 76 y.o. male.  HPI Patient presents to the emergency department with weakness and several falls over the last few weeks.  The patient has also had some tingling in his extremities.  The patient was seen by his primary doctor this past week following one of the falls, states that he is having left knee pain as a result.  Patient is also complaining of some lower back discomfort as well. The patient denies chest pain, shortness of breath, headache,blurred vision, neck pain, fever, cough,  dizziness, anorexia, edema, abdominal pain, nausea, vomiting, diarrhea, rash, back pain, dysuria, hematemesis, bloody stool, near syncope, or syncope. Past Medical History:  Diagnosis Date  . Arthritis   . Coronary artery disease   . Diabetes mellitus    insulin dependent  . Dyslipidemia   . Hypertension     Patient Active Problem List   Diagnosis Date Noted  . Hyponatremia 04/07/2017  . Pure hypercholesterolemia 11/11/2013  . CAD (coronary atherosclerotic disease) 10/09/2013  . 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 PBA (pseudobulbar affect) 07/01/2013  . Type II or unspecified type diabetes mellitus without mention of complication, uncontrolled 04/21/2013  . Other and unspecified hyperlipidemia 04/21/2013  . CVA (cerebral vascular accident) ([place]) 04/21/2013  . Essential hypertension, benign 04/21/2013  . Dizziness 02/01/2012  . Gait disturbance 02/01/2012    Past Surgical History:  Procedure Laterality Date  . CARDIAC CATHETERIZATION    . CORONARY ARTERY BYPASS GRAFT    . LEFT HEART CATHETERIZATION WITH CORONARY ANGIOGRAM N/A 09/15/2013   Procedure: LEFT HEART CATHETERIZATION WITH CORONARY ANGIOGRAM;  Surgeon: Clent Demark, MD;  Location: [facility] CATH LAB;   Service: Cardiovascular;  Laterality: N/A;  . open heart surgery  1998  . PERCUTANEOUS STENT INTERVENTION  09/15/2013   Procedure: PERCUTANEOUS STENT INTERVENTION;  Surgeon: Clent Demark, MD;  Location: [facility] CATH LAB;  Service: Cardiovascular;;       Home Medications    Prior to Admission medications   Medication Sig Start Date End Date Taking? Authorizing Provider  aspirin 81 MG tablet Take 81 mg by mouth daily.   Yes [provider]  colesevelam (WELCHOL) 625 MG tablet Take 3 tablets twice a day Patient taking differently: Take 625 mg by mouth 3 (three) times daily. Take 3 tablets twice a day 12/15/13  Yes Elayne Snare, MD  furosemide (LASIX) 40 MG tablet Take 40 mg by mouth daily.   Yes [provider]  ACCU-CHEK AVIVA PLUS test strip USE AS DIRECTED 3 TIMES A DAY 04/10/16   Elayne Snare, MD  atorvastatin (LIPITOR) 80 MG tablet Take 1 tablet (80 mg total) by mouth daily. Patient not taking: Reported on 04/07/2017 11/05/14   Elayne Snare, MD  atorvastatin (LIPITOR) 80 MG tablet Take 1 tablet (80 mg total) by mouth daily. Patient not taking: Reported on 04/07/2017 09/07/16   Elayne Snare, MD  calcium carbonate (OS-CAL) 600 MG TABS tablet Take 1,200 mg by mouth daily with breakfast.    [provider]  Camphor-Eucalyptus-Menthol (VICKS VAPORUB EX) Apply 1 application topically daily as needed.    [provider]  gabapentin (NEURONTIN) 300 MG capsule Take 1 capsule (300 mg total) by mouth 3 (three) times daily. 09/07/16   Dwyane Dee,  Vicenta Aly, MD  insulin aspart (NOVOLOG) 100 UNIT/ML injection Inject 15-30-25 with meals, 02/11/16   Elayne Snare, MD  insulin lispro (HUMALOG) 100 UNIT/ML injection Take before breakfast 10 units, lunch time 25 and dinner 30 units  09/07/16   Elayne Snare, MD  insulin NPH Human (NOVOLIN N RELION) 100 UNIT/ML injection morning 35 units and bedtime 25 units Patient taking differently: morning 45 units 09/07/16   Elayne Snare, MD  insulin NPH Human  (NOVOLIN N) 100 UNIT/ML injection In AM 25 units and bedtime 25 units. 07/21/16   Elayne Snare, MD  insulin regular (NOVOLIN R) 100 units/mL injection 10 units at breakfast, 25 units at lunch, 25 units at dinner. 15 minutes before meals. Patient taking differently: 30 units at lunch, 25 units at dinner. 15 minutes before meals. 07/21/16   Elayne Snare, MD  isosorbide mononitrate (IMDUR) 30 MG 24 hr tablet Take 1 tablet (30 mg total) by mouth daily. 09/18/13   Dixie Dials, MD  KLOR-CON 10 10 MEQ tablet Take 10 mEq by mouth daily. 10/29/14   [provider]  memantine (NAMENDA) 10 MG tablet Take 10 mg by mouth daily. 02/15/15   [provider]  metFORMIN (GLUCOPHAGE-XR) 500 MG 24 hr tablet TAKE 3 TABLETS BY MOUTH ONCE DAILY WITH SUPPER 02/28/17   Elayne Snare, MD  metoprolol tartrate (LOPRESSOR) 12.5 mg TABS tablet Take 0.5 tablets (12.5 mg total) by mouth 2 (two) times daily. 09/18/13   Dixie Dials, MD  nitroGLYCERIN (NITROSTAT) 0.4 MG SL tablet Place 1 tablet (0.4 mg total) under the tongue every 5 (five) minutes x 3 doses as needed for chest pain. 09/18/13   Dixie Dials, MD  Ticagrelor (BRILINTA) 90 MG TABS tablet Take 1 tablet (90 mg total) by mouth 2 (two) times daily. 09/18/13   Dixie Dials, MD  valsartan (DIOVAN) 160 MG tablet TAKE 1 TABLET (160 MG TOTAL) BY MOUTH DAILY. 04/02/17   Elayne Snare, MD    Family History History reviewed. No pertinent family history.  Social History Social History  Substance Use Topics  . Smoking status: Never Smoker  . Smokeless tobacco: Never Used  . Alcohol use No     Allergies   Patient has no known allergies.   Review of Systems Review of Systems All other systems negative except as documented in the HPI. All pertinent positives and negatives as reviewed in the HPI.  Physical Exam Updated Vital Signs BP (!) 178/74 (BP Location: Right Arm)   Pulse 89   Temp 98.2 F (36.8 C) (Oral)   Resp 18   SpO2 100%   Physical Exam    Constitutional: He is oriented to person, place, and time. He appears well-developed and well-nourished. No distress.  HENT:  Head: Normocephalic and atraumatic.  Mouth/Throat: Oropharynx is clear and moist.  Eyes: Pupils are equal, round, and reactive to light.  Neck: Normal range of motion. Neck supple.  Cardiovascular: Normal rate, regular rhythm and normal heart sounds.  Exam reveals no gallop and no friction rub.   No murmur heard. Pulmonary/Chest: Effort normal and breath sounds normal. No respiratory distress. He has no wheezes.  Abdominal: Soft. Bowel sounds are normal. He exhibits no distension. There is no tenderness.  Neurological: He is alert and oriented to person, place, and time. He has normal strength. No sensory deficit. He exhibits normal muscle tone. Coordination normal.  Skin: Skin is warm and dry. Capillary refill takes less than 2 seconds. No rash noted. No erythema.  Psychiatric: He has a  normal mood and affect. His behavior is normal.  Nursing note and vitals reviewed.    ED Treatments / Results  Labs (all labs ordered are listed, but only abnormal results are displayed) Labs Reviewed  APTT - Abnormal; Notable for the following:       Result Value   aPTT 45 (*)    All other components within normal limits  CBC - Abnormal; Notable for the following:    RBC 4.07 (*)    Hemoglobin 10.8 (*)    HCT 31.9 (*)    Platelets 433 (*)    All other components within normal limits  COMPREHENSIVE METABOLIC PANEL - Abnormal; Notable for the following:    Sodium 112 (*)    Potassium 5.3 (*)    Chloride 82 (*)    Glucose, Bld 282 (*)    Calcium 8.4 (*)    Albumin 2.8 (*)    AST 83 (*)    ALT 93 (*)    Alkaline Phosphatase 167 (*)    All other components within normal limits  GLUCOSE, CAPILLARY - Abnormal; Notable for the following:    Glucose-Capillary 278 (*)    All other components within normal limits  CBG MONITORING, ED - Abnormal; Notable for the following:     Glucose-Capillary 309 (*)    All other components within normal limits  I-STAT CHEM 8, ED - Abnormal; Notable for the following:    Sodium 114 (*)    Potassium 5.3 (*)    Chloride 81 (*)    Creatinine, Ser 0.60 (*)    Glucose, Bld 288 (*)    Calcium, Ion 1.10 (*)    Hemoglobin 11.9 (*)    HCT 35.0 (*)    All other components within normal limits  CBG MONITORING, ED - Abnormal; Notable for the following:    Glucose-Capillary 280 (*)    All other components within normal limits  PROTIME-INR  DIFFERENTIAL  CBC  COMPREHENSIVE METABOLIC PANEL  MAGNESIUM  TSH  HEMOGLOBIN W1U  BASIC METABOLIC PANEL  CBC  LIPID PANEL  OSMOLALITY, URINE  NA AND K (SODIUM & POTASSIUM), RAND UR  OSMOLALITY  I-STAT TROPOININ, ED    EKG  EKG Interpretation None       Radiology Ct Head Wo Contrast  Result Date: 04/07/2017 CLINICAL DATA:  Right lower extremity numbness and tingling EXAM: CT HEAD WITHOUT CONTRAST TECHNIQUE: Contiguous axial images were obtained from the base of the skull through the vertex without intravenous contrast. COMPARISON:  Brain MRI Feb 01, 2012; brain MRI January 14, 2009; head CT January 13, 2009 FINDINGS: Brain: There is moderate diffuse atrophy. There is no apparent intracranial mass, hemorrhage, extra-axial fluid collection, or midline shift. There is evidence of what appears to be prior infarct in the left medial occipital lobe, not present on most recent available studies. There is evidence of a prior infarct in the right thalamus. A smaller infarct is noted in the left thalamus. Elsewhere, there is small vessel disease throughout the centra semiovale bilaterally. There is no appreciable acute infarct on the current examination. Vascular: There is no appreciable hyperdense vessel. There is calcification in each carotid siphon. There is also calcification in each distal vertebral artery. Skull: The bony calvarium appears intact. Sinuses/Orbits: There is mucosal thickening in  multiple ethmoid air cells bilaterally. Visualized paranasal sinuses elsewhere clear. Orbits appear symmetric bilaterally. Other: There is chronic mastoid air cell thickening bilaterally. IMPRESSION: Atrophy with periventricular small vessel disease. Old appearing infarct in the medial  left occipital lobe, not present on most recent prior studies. Prior appearing small infarcts in each thalamus. No acute infarct is demonstrable by CT. No mass, hemorrhage, or extra-axial fluid collection. There are multiple foci of arterial vascular calcification. There is ethmoid sinus disease bilaterally. There is chronic mastoid air cell thickening bilaterally. Electronically Signed   By: Lowella Grip III M.D.   On: 04/07/2017 18:52   Dg Knee Complete 4 Views Right  Result Date: 04/07/2017 CLINICAL DATA:  Pain with recent history of falls. EXAM: RIGHT KNEE - COMPLETE 4+ VIEW COMPARISON:  None. FINDINGS: Vascular calcifications are identified. No fracture or joint effusion. No dislocation. IMPRESSION: Negative. Electronically Signed   By: Dorise Bullion III M.D   On: 04/07/2017 18:20    Procedures Procedures (including critical care time)  Medications Ordered in ED Medications  isosorbide mononitrate (IMDUR) 24 hr tablet 30 mg (not administered)  metoprolol tartrate (LOPRESSOR) tablet 12.5 mg (not administered)  nitroGLYCERIN (NITROSTAT) SL tablet 0.4 mg (not administered)  ticagrelor (BRILINTA) tablet 90 mg (not administered)  aspirin EC tablet 81 mg (not administered)  enoxaparin (LOVENOX) injection 40 mg (not administered)  0.9 %  sodium chloride infusion ( Intravenous New Bag/Given 04/08/17 0013)  insulin glargine (LANTUS) injection 20 Units (not administered)  insulin aspart (novoLOG) injection 0-9 Units (not administered)  atorvastatin (LIPITOR) tablet 40 mg (not administered)     Initial Impression / Assessment and Plan / ED Course  I have reviewed the triage vital signs and the nursing  notes.  Pertinent labs & imaging results that were available during my care of the patient were reviewed by me and considered in my medical decision making (see chart for details).     CRITICAL CARE Performed by: Brent General Total critical care time: 30 minutes Critical care time was exclusive of separately billable procedures and treating other patients. Critical care was necessary to treat or prevent imminent or life-threatening deterioration. Critical care was time spent personally by me on the following activities: development of treatment plan with patient and/or surrogate as well as nursing, discussions with consultants, evaluation of patient's response to treatment, examination of patient, obtaining history from patient or surrogate, ordering and performing treatments and interventions, ordering and review of laboratory studies, ordering and review of radiographic studies, pulse oximetry and re-evaluation of patient's condition.  Patient has severe hyponatremia.  Patient will need admission to the hospital for further evaluation and care of his hyponatremia Final Clinical Impressions(s) / ED Diagnoses   Final diagnoses:  Hyponatremia    New Prescriptions Current Discharge Medication List       Dalia Heading, Hershal Coria 04/08/17 Francisca December, MD 04/12/17 1555

## 2017-04-08 NOTE — Progress Notes (Signed)
CRITICAL VALUE ALERT  Critical Value:  Na 115 Date & Time Notied:  04/08/17 1221 Provider Notified: Dr. Terrence Dupont 1222 Orders Received/Actions taken: MD aware continue NS @50ml /hr

## 2017-04-08 NOTE — Progress Notes (Signed)
Call returned from Dr. Terrence Dupont in response to page regarding Na+ of 111, order received to recheck BMET @ 1100 today.  P.J. Linus Mako, RN

## 2017-04-08 NOTE — Progress Notes (Signed)
Lab notified RN of critical Na+ level of 112, this is up from 111, Dr. Terrence Dupont is aware of low Na+, order already placed to have BMET rechecked at 1100 today.  P.J. Linus Mako, RN

## 2017-04-08 NOTE — Progress Notes (Signed)
CRITICAL VALUE ALERT  Critical Value:  Na+  111  Date & Time Notied:  04/08/2017 0231  Provider Notified: Dr. Terrence Dupont  Orders Received/Actions taken: awaiting response

## 2017-04-08 NOTE — Progress Notes (Signed)
RN contacted Dr. Terrence Dupont, s/w answering service, to make him aware of patient's critical Na+ of 111, awaiting response from MD.  P.J. Linus Mako, RN

## 2017-04-09 LAB — BASIC METABOLIC PANEL
Anion gap: 7 (ref 5–15)
BUN: 7 mg/dL (ref 6–20)
CO2: 24 mmol/L (ref 22–32)
Calcium: 8.3 mg/dL — ABNORMAL LOW (ref 8.9–10.3)
Chloride: 88 mmol/L — ABNORMAL LOW (ref 101–111)
Creatinine, Ser: 0.71 mg/dL (ref 0.61–1.24)
Glucose, Bld: 181 mg/dL — ABNORMAL HIGH (ref 65–99)
POTASSIUM: 4.7 mmol/L (ref 3.5–5.1)
SODIUM: 119 mmol/L — AB (ref 135–145)

## 2017-04-09 LAB — IRON AND TIBC
Iron: 61 ug/dL (ref 45–182)
Saturation Ratios: 23 % (ref 17.9–39.5)
TIBC: 263 ug/dL (ref 250–450)
UIBC: 202 ug/dL

## 2017-04-09 LAB — GLUCOSE, CAPILLARY
GLUCOSE-CAPILLARY: 172 mg/dL — AB (ref 65–99)
GLUCOSE-CAPILLARY: 247 mg/dL — AB (ref 65–99)
GLUCOSE-CAPILLARY: 237 mg/dL — AB (ref 65–99)
Glucose-Capillary: 200 mg/dL — ABNORMAL HIGH (ref 65–99)

## 2017-04-09 LAB — CBC
HCT: 26.3 % — ABNORMAL LOW (ref 39.0–52.0)
HEMOGLOBIN: 8.9 g/dL — AB (ref 13.0–17.0)
MCH: 26.6 pg (ref 26.0–34.0)
MCHC: 33.8 g/dL (ref 30.0–36.0)
MCV: 78.5 fL (ref 78.0–100.0)
PLATELETS: 369 10*3/uL (ref 150–400)
RBC: 3.35 MIL/uL — AB (ref 4.22–5.81)
RDW: 15.1 % (ref 11.5–15.5)
WBC: 8.1 10*3/uL (ref 4.0–10.5)

## 2017-04-09 LAB — HEMOGLOBIN A1C
HEMOGLOBIN A1C: 8.5 % — AB (ref 4.8–5.6)
Mean Plasma Glucose: 197 mg/dL

## 2017-04-09 LAB — FERRITIN: Ferritin: 429 ng/mL — ABNORMAL HIGH (ref 24–336)

## 2017-04-09 MED ORDER — ACETAMINOPHEN 325 MG PO TABS
650.0000 mg | ORAL_TABLET | Freq: Four times a day (QID) | ORAL | Status: DC | PRN
  Administered 2017-04-09: 650 mg via ORAL
  Filled 2017-04-09: qty 2

## 2017-04-09 NOTE — Progress Notes (Signed)
[facility]  Patient Status: Active (receiving services up to time of hospitalization)  [facility] is providing the following services: PT and HHA  Referred to [facility] from Physician office but admitted to Hospital prior to start of services.  If patient discharges after hours, please call [phone].   Janae Sauce 04/09/2017, 3:35 PM

## 2017-04-09 NOTE — Progress Notes (Signed)
Ref: Dixie Dials, MD   Subjective:  Feeling better. Afebrile.  Objective:  Vital Signs in the last 24 hours: Temp:  [97.9 F (36.6 C)-98.5 F (36.9 C)] 97.9 F (36.6 C) (07/09 0455) Pulse Rate:  [72-86] 75 (07/09 0834) Cardiac Rhythm: Normal sinus rhythm;Heart block (07/09 0700) Resp:  [18-20] 18 (07/09 0455) BP: (130-163)/(55-73) 163/73 (07/09 0834) SpO2:  [99 %-100 %] 100 % (07/08 2225)  Physical Exam: BP Readings from Last 1 Encounters:  04/09/17 (!) 163/73    Wt Readings from Last 1 Encounters:  01/12/17 76.7 kg (169 lb)    Weight change:  There is no height or weight on file to calculate BMI. HEENT: [place]/AT, Eyes-Brown, PERL, EOMI, Conjunctiva-Pink, Sclera-Non-icteric Neck: No JVD, No bruit, Trachea midline. Lungs:  Clear, Bilateral. Cardiac:  Regular rhythm, normal S1 and S2, no S3. II/VI systolic murmur. Abdomen:  Soft, non-tender. BS present. Extremities:  No edema present. No cyanosis. No clubbing. CNS: AxOx3, Cranial nerves grossly intact, moves all 4 extremities. Right sided weakness. Skin: Warm and dry.   Intake/Output from previous day: 07/08 0701 - 07/09 0700 In: 508.3 [I.V.:508.3] Out: 850 [Urine:850]    Lab Results: BMET    Component Value Date/Time   NA 119 (LL) 04/09/2017 0343   NA 115 (LL) 04/08/2017 1045   NA 111 (LL) 04/08/2017 0124   K 4.7 04/09/2017 0343   K 4.9 04/08/2017 1045   K 4.7 04/08/2017 0124   CL 88 (L) 04/09/2017 0343   CL 85 (L) 04/08/2017 1045   CL 81 (L) 04/08/2017 0124   CO2 24 04/09/2017 0343   CO2 24 04/08/2017 1045   CO2 22 04/08/2017 0124   GLUCOSE 181 (H) 04/09/2017 0343   GLUCOSE 333 (H) 04/08/2017 1045   GLUCOSE 338 (H) 04/08/2017 0124   BUN 7 04/09/2017 0343   BUN 9 04/08/2017 1045   BUN 9 04/08/2017 0124   CREATININE 0.71 04/09/2017 0343   CREATININE 0.81 04/08/2017 1045   CREATININE 0.91 04/08/2017 0124   CALCIUM 8.3 (L) 04/09/2017 0343   CALCIUM 8.2 (L) 04/08/2017 1045   CALCIUM 8.4 (L) 04/08/2017  0124   GFRNONAA >60 04/09/2017 0343   GFRNONAA >60 04/08/2017 1045   GFRNONAA >60 04/08/2017 0124   GFRAA >60 04/09/2017 0343   GFRAA >60 04/08/2017 1045   GFRAA >60 04/08/2017 0124   CBC    Component Value Date/Time   WBC 8.1 04/09/2017 0343   RBC 3.35 (L) 04/09/2017 0343   HGB 8.9 (L) 04/09/2017 0343   HCT 26.3 (L) 04/09/2017 0343   PLT 369 04/09/2017 0343   MCV 78.5 04/09/2017 0343   MCH 26.6 04/09/2017 0343   MCHC 33.8 04/09/2017 0343   RDW 15.1 04/09/2017 0343   LYMPHSABS 1.2 04/07/2017 1732   MONOABS 0.9 04/07/2017 1732   EOSABS 0.1 04/07/2017 1732   BASOSABS 0.0 04/07/2017 1732   HEPATIC Function Panel  Recent Labs  01/10/17 0849 04/07/17 1732 04/08/17 0000  PROT 8.0 7.3 7.0   HEMOGLOBIN A1C No components found for: HGA1C,  MPG CARDIAC ENZYMES Lab Results  Component Value Date   TROPONINI 4.14 ([place]) 09/17/2013   BNP No results for input(s): PROBNP in the last 8760 hours. TSH  Recent Labs  04/08/17 0124  TSH 1.020   CHOLESTEROL  Recent Labs  08/31/16 1012 01/10/17 0849 04/08/17 0124  CHOL 250* 114 102    Scheduled Meds: . aspirin EC  81 mg Oral Daily  . atorvastatin  40 mg Oral q1800  .  enoxaparin (LOVENOX) injection  40 mg Subcutaneous Daily  . insulin aspart  0-9 Units Subcutaneous TID WC  . insulin glargine  30 Units Subcutaneous QHS  . isosorbide mononitrate  30 mg Oral Daily  . losartan  25 mg Oral Daily  . metoprolol tartrate  25 mg Oral BID  . ticagrelor  90 mg Oral BID   Continuous Infusions: . sodium chloride 50 mL/hr at 04/08/17 2304   PRN Meds:.nitroGLYCERIN  Assessment/Plan: Hyponatremia-improving [place] Hypertension Hyperlipidemia Anemia of chronic disease DM, II CAD CABG S/P stroke with right sided weakness  Change diet to carb modified, regular salt diet PT consult   LOS: 2 days    Dixie Dials  MD  04/09/2017, 8:36 AM

## 2017-04-09 NOTE — Progress Notes (Signed)
CRITICAL VALUE ALERT  Critical Value:  Na+ 119  Date & Time Notied:  04/09/17 0500  Provider Notified: Dr. Terrence Dupont  Orders Received/Actions taken: Awaiting response

## 2017-04-10 ENCOUNTER — Other Ambulatory Visit: Payer: Medicare HMO

## 2017-04-10 ENCOUNTER — Encounter (HOSPITAL_COMMUNITY): Payer: Self-pay | Admitting: Radiology

## 2017-04-10 ENCOUNTER — Inpatient Hospital Stay (HOSPITAL_COMMUNITY): Payer: Medicare HMO

## 2017-04-10 DIAGNOSIS — E871 Hypo-osmolality and hyponatremia: Secondary | ICD-10-CM

## 2017-04-10 LAB — BASIC METABOLIC PANEL
Anion gap: 5 (ref 5–15)
BUN: 9 mg/dL (ref 6–20)
CALCIUM: 8.2 mg/dL — AB (ref 8.9–10.3)
CO2: 22 mmol/L (ref 22–32)
CREATININE: 0.83 mg/dL (ref 0.61–1.24)
Chloride: 89 mmol/L — ABNORMAL LOW (ref 101–111)
GLUCOSE: 116 mg/dL — AB (ref 65–99)
Potassium: 4.5 mmol/L (ref 3.5–5.1)
Sodium: 116 mmol/L — CL (ref 135–145)

## 2017-04-10 LAB — GLUCOSE, CAPILLARY
GLUCOSE-CAPILLARY: 139 mg/dL — AB (ref 65–99)
Glucose-Capillary: 147 mg/dL — ABNORMAL HIGH (ref 65–99)
Glucose-Capillary: 215 mg/dL — ABNORMAL HIGH (ref 65–99)
Glucose-Capillary: 229 mg/dL — ABNORMAL HIGH (ref 65–99)

## 2017-04-10 LAB — OSMOLALITY, URINE: Osmolality, Ur: 229 mOsm/kg — ABNORMAL LOW (ref 300–900)

## 2017-04-10 LAB — URIC ACID: Uric Acid, Serum: 3.6 mg/dL — ABNORMAL LOW (ref 4.4–7.6)

## 2017-04-10 MED ORDER — TOLVAPTAN 15 MG PO TABS
15.0000 mg | ORAL_TABLET | ORAL | Status: AC
  Administered 2017-04-10: 15 mg via ORAL
  Filled 2017-04-10: qty 1

## 2017-04-10 MED ORDER — IOPAMIDOL (ISOVUE-300) INJECTION 61%
INTRAVENOUS | Status: AC
  Administered 2017-04-10: 100 mL
  Filled 2017-04-10: qty 100

## 2017-04-10 NOTE — Evaluation (Signed)
Physical Therapy Evaluation Patient Details Name: Calvin Perez MRN: [id number] DOB: 04-Nov-1940 Today's Date: 04/10/2017   History of Present Illness  Patient is a 76 y/o male admitted with weakness, fatigue and cramps.  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  PMH positive for CABG 2006, cardiac stenting, DM, HTN.  Clinical Impression  Patient presents with decreased independence with mobility due to weakness, limited activity tolerance, decreased balance due to medical issues and bedrest.  Feel he will benefit from skilled PT in the acute setting to allow return home with family support and follow up HHPT.     Follow Up Recommendations Home health PT    Equipment Recommendations  None recommended by PT    Recommendations for Other Services       Precautions / Restrictions Precautions Precautions: Fall Precaution Comments: fell about 2 months ago on stairs in the home      Mobility  Bed Mobility Overal bed mobility: Needs Assistance Bed Mobility: Supine to Sit;Sit to Supine     Supine to sit: Min assist Sit to supine: Min assist   General bed mobility comments: assist to lift trunk, scooting EOB With increased time and effort, assist to prevent posterior LOB; to supine, assist for lines, safety  Transfers Overall transfer level: Needs assistance Equipment used: Rolling walker (2 wheeled) Transfers: Sit to/from Stand Sit to Stand: Min guard         General transfer comment: patient perseverates on frequent urination, but relates does not need to go currently, stood with assist for balance  Ambulation/Gait Ambulation/Gait assistance: Min assist Ambulation Distance (Feet): 130 Feet Assistive device: Rolling walker (2 wheeled) Gait Pattern/deviations: Step-through pattern;Decreased stride length;Shuffle     General Gait Details: cues for stride length, assist to turn walker, assist for balance initially; HR WNL with ambulation, SpO2 96%, dyspnea  3/4 after ambulation  Stairs            Wheelchair Mobility    Modified Rankin (Stroke Patients Only)       Balance Overall balance assessment: Needs assistance   Sitting balance-Leahy Scale: Good     Standing balance support: During functional activity Standing balance-Leahy Scale: Fair Standing balance comment: stood to urinate and wash hands with S/minguard without UE support                             Pertinent Vitals/Pain Pain Assessment: No/denies pain    Home Living Family/patient expects to be discharged to:: Private residence Living Arrangements: Spouse/significant other;Children Available Help at Discharge: Family Type of Home: House Home Access: Stairs to enter     Home Layout: Two level;Bed/bath upstairs Home Equipment: Environmental consultant - 2 wheels;Cane - single point Additional Comments: wife reports daughter lives with them as well    Prior Function Level of Independence: Independent with assistive device(s)         Comments: reports used cane PTA     Hand Dominance        Extremity/Trunk Assessment   Upper Extremity Assessment Upper Extremity Assessment: Defer to OT evaluation    Lower Extremity Assessment Lower Extremity Assessment: Generalized weakness       Communication   Communication: Prefers language other than English;HOH (communicates in broken [place])  Cognition Arousal/Alertness: Awake/alert Behavior During Therapy: WFL for tasks assessed/performed Overall Cognitive Status: Impaired/Different from baseline Area of Impairment: Following commands;Problem solving  Following Commands: Follows one step commands inconsistently     Problem Solving: Slow processing;Decreased initiation;Difficulty sequencing;Requires verbal cues General Comments: difficult to assess due to language barrier      General Comments General comments (skin integrity, edema, etc.): wife present throughout and  assist with communications    Exercises     Assessment/Plan    PT Assessment Patient needs continued PT services  PT Problem List Decreased strength;Decreased activity tolerance;Decreased balance;Decreased mobility;Decreased knowledge of use of DME;Decreased cognition;Decreased safety awareness       PT Treatment Interventions DME instruction;Gait training;Therapeutic exercise;Patient/family education;Therapeutic activities;Stair training;Balance training;Functional mobility training    PT Goals (Current goals can be found in the Care Plan section)  Acute Rehab PT Goals Patient Stated Goal: To walk better PT Goal Formulation: With patient/family Time For Goal Achievement: 04/17/17 Potential to Achieve Goals: Good    Frequency Min 3X/week   Barriers to discharge        Co-evaluation               AM-PAC PT "6 Clicks" Daily Activity  Outcome Measure Difficulty turning over in bed (including adjusting bedclothes, sheets and blankets)?: Total Difficulty moving from lying on back to sitting on the side of the bed? : Total Difficulty sitting down on and standing up from a chair with arms (e.g., wheelchair, bedside commode, etc,.)?: Total Help needed moving to and from a bed to chair (including a wheelchair)?: A Little Help needed walking in hospital room?: A Little Help needed climbing 3-5 steps with a railing? : A Lot 6 Click Score: 11    End of Session Equipment Utilized During Treatment: Gait belt Activity Tolerance: Patient tolerated treatment well Patient left: in bed;with call bell/phone within reach;with bed alarm set;with family/visitor present   PT Visit Diagnosis: Other abnormalities of gait and mobility (R26.89);Muscle weakness (generalized) (M62.81)    Time: 8110-3159 PT Time Calculation (min) (ACUTE ONLY): 21 min   Charges:   PT Evaluation $PT Eval Moderate Complexity: 1 Procedure PT Treatments $Gait Training: 8-22 mins   PT G CodesMagda Kiel, [place] [phone] 04/10/2017   Reginia Naas 04/10/2017, 3:20 PM

## 2017-04-10 NOTE — Consult Note (Signed)
Reason for Consult: Hyponatremia Referring Physician: [name] is an 76 y.o. male.   HPI:   Patient was admitted for weakness, lethargy, recurrent falls and decreased appetite and found to have low sodium of 112 He has not had any new medications in the last few months and his previous sodium has been normal Patient is not able to give a good history and his wife says that he has not been drinking excessive amounts of fluids at home and overall has been having decreased appetite No history of nausea, vomiting or diarrhea Unclear whether the patient has lost weight recently, his previous weight in April was 169  Current evaluation has shown a high urine osmolality of 452 Other labs include a normal TSH, chest x-ray and a CT scan of the head   Past Medical History:  Diagnosis Date  . Arthritis   . Coronary artery disease   . Diabetes mellitus    insulin dependent  . Dyslipidemia   . Hypertension     Past Surgical History:  Procedure Laterality Date  . CARDIAC CATHETERIZATION    . CORONARY ARTERY BYPASS GRAFT    . LEFT HEART CATHETERIZATION WITH CORONARY ANGIOGRAM N/A 09/15/2013   Procedure: LEFT HEART CATHETERIZATION WITH CORONARY ANGIOGRAM;  Surgeon: Clent Demark, MD;  Location: [facility];  Service: Cardiovascular;  Laterality: N/A;  . open heart surgery  1998  . PERCUTANEOUS STENT INTERVENTION  09/15/2013   Procedure: PERCUTANEOUS STENT INTERVENTION;  Surgeon: Clent Demark, MD;  Location: [facility];  Service: Cardiovascular;;    History reviewed. No pertinent family history.  Social History:  reports that he has never smoked. He has never used smokeless tobacco. He reports that he does not drink alcohol or use drugs.  Allergies: No Known Allergies  Medications:  I have reviewed the patient's current medications. Scheduled: . aspirin EC  81 mg Oral Daily  . atorvastatin  40 mg Oral q1800  . enoxaparin (LOVENOX) injection  40 mg Subcutaneous  Daily  . insulin aspart  0-9 Units Subcutaneous TID WC  . insulin glargine  30 Units Subcutaneous QHS  . isosorbide mononitrate  30 mg Oral Daily  . losartan  25 mg Oral Daily  . metoprolol tartrate  25 mg Oral BID  . ticagrelor  90 mg Oral BID    Results for orders placed or performed during the hospital encounter of 04/07/17 (from the past 48 hour(s))  Glucose, capillary     Status: Abnormal   Collection Time: 04/08/17 12:39 PM  Result Value Ref Range   Glucose-Capillary 363 (H) 65 - 99 mg/dL  Glucose, capillary     Status: Abnormal   Collection Time: 04/08/17  4:54 PM  Result Value Ref Range   Glucose-Capillary 292 (H) 65 - 99 mg/dL  Glucose, capillary     Status: Abnormal   Collection Time: 04/08/17 11:13 PM  Result Value Ref Range   Glucose-Capillary 280 (H) 65 - 99 mg/dL  CBC     Status: Abnormal   Collection Time: 04/09/17  3:43 AM  Result Value Ref Range   WBC 8.1 4.0 - 10.5 K/uL   RBC 3.35 (L) 4.22 - 5.81 MIL/uL   Hemoglobin 8.9 (L) 13.0 - 17.0 g/dL   HCT 26.3 (L) 39.0 - 52.0 %   MCV 78.5 78.0 - 100.0 fL   MCH 26.6 26.0 - 34.0 pg   MCHC 33.8 30.0 - 36.0 g/dL   RDW 15.1 11.5 - 15.5 %  Platelets 369 150 - 400 K/uL  Basic metabolic panel     Status: Abnormal   Collection Time: 04/09/17  3:43 AM  Result Value Ref Range   Sodium 119 (LL) 135 - 145 mmol/L    Comment: CRITICAL RESULT CALLED TO, READ BACK BY AND VERIFIED WITH: SEXTON T,RN 04/09/17 0514 WAYK    Potassium 4.7 3.5 - 5.1 mmol/L   Chloride 88 (L) 101 - 111 mmol/L   CO2 24 22 - 32 mmol/L   Glucose, Bld 181 (H) 65 - 99 mg/dL   BUN 7 6 - 20 mg/dL   Creatinine, Ser 0.71 0.61 - 1.24 mg/dL   Calcium 8.3 (L) 8.9 - 10.3 mg/dL   GFR calc non Af Amer >60 >60 mL/min   GFR calc Af Amer >60 >60 mL/min    Comment: (NOTE) The eGFR has been calculated using the CKD EPI equation. This calculation has not been validated in all clinical situations. eGFR's persistently <60 mL/min signify possible Chronic  Kidney Disease.    Anion gap 7 5 - 15  Glucose, capillary     Status: Abnormal   Collection Time: 04/09/17  7:52 AM  Result Value Ref Range   Glucose-Capillary 172 (H) 65 - 99 mg/dL  Iron and TIBC     Status: None   Collection Time: 04/09/17  9:02 AM  Result Value Ref Range   Iron 61 45 - 182 ug/dL   TIBC 263 250 - 450 ug/dL   Saturation Ratios 23 17.9 - 39.5 %   UIBC 202 ug/dL  Ferritin     Status: Abnormal   Collection Time: 04/09/17  9:02 AM  Result Value Ref Range   Ferritin 429 (H) 24 - 336 ng/mL  Glucose, capillary     Status: Abnormal   Collection Time: 04/09/17 12:09 PM  Result Value Ref Range   Glucose-Capillary 247 (H) 65 - 99 mg/dL  Glucose, capillary     Status: Abnormal   Collection Time: 04/09/17  4:32 PM  Result Value Ref Range   Glucose-Capillary 237 (H) 65 - 99 mg/dL  Glucose, capillary     Status: Abnormal   Collection Time: 04/09/17  9:30 PM  Result Value Ref Range   Glucose-Capillary 200 (H) 65 - 99 mg/dL  Basic metabolic panel     Status: Abnormal   Collection Time: 04/10/17  4:38 AM  Result Value Ref Range   Sodium 116 (LL) 135 - 145 mmol/L    Comment: CRITICAL RESULT CALLED TO, READ BACK BY AND VERIFIED WITH: POTEAT A,RN 04/10/17 0527 WAYK    Potassium 4.5 3.5 - 5.1 mmol/L   Chloride 89 (L) 101 - 111 mmol/L   CO2 22 22 - 32 mmol/L   Glucose, Bld 116 (H) 65 - 99 mg/dL   BUN 9 6 - 20 mg/dL   Creatinine, Ser 0.83 0.61 - 1.24 mg/dL   Calcium 8.2 (L) 8.9 - 10.3 mg/dL   GFR calc non Af Amer >60 >60 mL/min   GFR calc Af Amer >60 >60 mL/min    Comment: (NOTE) The eGFR has been calculated using the CKD EPI equation. This calculation has not been validated in all clinical situations. eGFR's persistently <60 mL/min signify possible Chronic Kidney Disease.    Anion gap 5 5 - 15  Glucose, capillary     Status: Abnormal   Collection Time: 04/10/17  7:57 AM  Result Value Ref Range   Glucose-Capillary 147 (H) 65 - 99 mg/dL    No results  found.  Review of Systems  Constitutional: Negative for fever.  HENT:       No difficulty swallowing  Eyes: Negative for blurred vision.  Respiratory: Negative for cough and shortness of breath.   Cardiovascular: Negative for chest pain and leg swelling.  Gastrointestinal:       He complains of some vague lower abdominal discomfort  Genitourinary: Negative for hematuria.  Musculoskeletal: Positive for back pain.       He has mild lower back pain on the right side  Skin: Negative for rash.  Neurological: Positive for weakness. Negative for headaches.  Psychiatric/Behavioral: Negative for hallucinations.   Blood pressure 140/65, pulse 67, temperature 97.8 F (36.6 C), temperature source Oral, resp. rate 18, SpO2 100 %. Physical Exam  Constitutional: He is oriented to person, place, and time. He appears well-developed and well-nourished.  HENT:  Mild dryness of the oral mucosa present  Eyes: No scleral icterus.  Neck: No thyromegaly present.  Cardiovascular: Normal rate, regular rhythm and normal heart sounds.  Exam reveals no gallop.   No murmur heard. Respiratory: No respiratory distress. He has no wheezes. He has no rales.  GI: He exhibits no distension and no mass. There is no tenderness.  Musculoskeletal: He exhibits edema. He exhibits no tenderness.  Lymphadenopathy:    He has no cervical adenopathy.  Neurological: He is oriented to person, place, and time. He has normal reflexes. No cranial nerve deficit.  Speech is slightly slurred at times He is slightly confused and unable to remember my name, does not always appropriately answer questions Mentally alert  Skin: No rash noted. He is not diaphoretic. There is pallor.  Psychiatric: He has a normal mood and affect. His behavior is normal.    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 He is not on any thiazide diuretics or SSRI drugs No history of excessive fluid intake Currently not in CHF or having  any edema  Unclear how long he has had hyponatremia but appears to be relatively rapid in onset and symptomatic He appears slightly confused today  His physical exam is unremarkable Current studies do not indicate any obvious underlying malignancy No clinical signs or symptoms suggestive of adrenal insufficiency or hypothyroidism  Since he is symptomatic and not improving with oral fluid restriction and IV saline will have him started on Samsca 15 mg today and increase to 30 mg tomorrow if still having low-sodium He is not a candidate for 3% saline because of history of decreased LV function  Currently is having a CT scan of his abdomen to rule out occult malignancy especially with his anemia of unclear etiology, does not have obvious aren't deficiency   Roselinda Bahena 04/10/2017, 11:08 AM

## 2017-04-10 NOTE — Progress Notes (Signed)
Ref: Dixie Dials, MD   Subjective:  Hyponatremia continues. Denies shortness of breath.   Objective:  Vital Signs in the last 24 hours: Temp:  [97.8 F (36.6 C)-98.3 F (36.8 C)] 97.8 F (36.6 C) (07/10 0528) Pulse Rate:  [67-76] 67 (07/10 0528) Cardiac Rhythm: Heart block (07/10 0700) Resp:  [18-20] 18 (07/10 0528) BP: (136-153)/(62-65) 140/65 (07/10 0528) SpO2:  [100 %] 100 % (07/10 0528)  Physical Exam: BP Readings from Last 1 Encounters:  04/10/17 140/65    Wt Readings from Last 1 Encounters:  01/12/17 76.7 kg (169 lb)    Weight change:  There is no height or weight on file to calculate BMI. HEENT: [place]/AT, Eyes-Brown, PERL, EOMI, Conjunctiva-Pale pink, Sclera-Non-icteric Neck: No JVD, No bruit, Trachea midline. Lungs:  Clear, Bilateral. Cardiac:  Regular rhythm, normal S1 and S2, no S3. II/VI systolic murmur. Abdomen:  Soft, non-tender. BS present. Extremities:  No edema present. No cyanosis. No clubbing. CNS: AxOx3, Cranial nerves grossly intact, moves all 4 extremities. Right sided weakness. Skin: Warm and dry.   Intake/Output from previous day: 07/09 0701 - 07/10 0700 In: 240 [P.O.:240] Out: 550 [Urine:550]    Lab Results: BMET    Component Value Date/Time   NA 116 (LL) 04/10/2017 0438   NA 119 (LL) 04/09/2017 0343   NA 115 (LL) 04/08/2017 1045   K 4.5 04/10/2017 0438   K 4.7 04/09/2017 0343   K 4.9 04/08/2017 1045   CL 89 (L) 04/10/2017 0438   CL 88 (L) 04/09/2017 0343   CL 85 (L) 04/08/2017 1045   CO2 22 04/10/2017 0438   CO2 24 04/09/2017 0343   CO2 24 04/08/2017 1045   GLUCOSE 116 (H) 04/10/2017 0438   GLUCOSE 181 (H) 04/09/2017 0343   GLUCOSE 333 (H) 04/08/2017 1045   BUN 9 04/10/2017 0438   BUN 7 04/09/2017 0343   BUN 9 04/08/2017 1045   CREATININE 0.83 04/10/2017 0438   CREATININE 0.71 04/09/2017 0343   CREATININE 0.81 04/08/2017 1045   CALCIUM 8.2 (L) 04/10/2017 0438   CALCIUM 8.3 (L) 04/09/2017 0343   CALCIUM 8.2 (L) 04/08/2017 1045    GFRNONAA >60 04/10/2017 0438   GFRNONAA >60 04/09/2017 0343   GFRNONAA >60 04/08/2017 1045   GFRAA >60 04/10/2017 0438   GFRAA >60 04/09/2017 0343   GFRAA >60 04/08/2017 1045   CBC    Component Value Date/Time   WBC 8.1 04/09/2017 0343   RBC 3.35 (L) 04/09/2017 0343   HGB 8.9 (L) 04/09/2017 0343   HCT 26.3 (L) 04/09/2017 0343   PLT 369 04/09/2017 0343   MCV 78.5 04/09/2017 0343   MCH 26.6 04/09/2017 0343   MCHC 33.8 04/09/2017 0343   RDW 15.1 04/09/2017 0343   LYMPHSABS 1.2 04/07/2017 1732   MONOABS 0.9 04/07/2017 1732   EOSABS 0.1 04/07/2017 1732   BASOSABS 0.0 04/07/2017 1732   HEPATIC Function Panel  Recent Labs  01/10/17 0849 04/07/17 1732 04/08/17 0000  PROT 8.0 7.3 7.0   HEMOGLOBIN A1C No components found for: HGA1C,  MPG CARDIAC ENZYMES Lab Results  Component Value Date   TROPONINI 4.14 ([place]) 09/17/2013   BNP No results for input(s): PROBNP in the last 8760 hours. TSH  Recent Labs  04/08/17 0124  TSH 1.020   CHOLESTEROL  Recent Labs  08/31/16 1012 01/10/17 0849 04/08/17 0124  CHOL 250* 114 102    Scheduled Meds: . aspirin EC  81 mg Oral Daily  . atorvastatin  40 mg Oral q1800  .  enoxaparin (LOVENOX) injection  40 mg Subcutaneous Daily  . insulin aspart  0-9 Units Subcutaneous TID WC  . insulin glargine  30 Units Subcutaneous QHS  . isosorbide mononitrate  30 mg Oral Daily  . losartan  25 mg Oral Daily  . metoprolol tartrate  25 mg Oral BID  . ticagrelor  90 mg Oral BID   Continuous Infusions: . sodium chloride 50 mL/hr at 04/08/17 2304   PRN Meds:.acetaminophen, nitroGLYCERIN  Assessment/Plan: Hyponatremia Weakness Hypertension Hyperlipidemia Anemia of chronic disease DM, II CAD CABG S/P stroke with right sided weakness   Increase IV saline. Check serum. Uric acid level. Chest x-ray and CT abdomen/Pelvis Discuss care with D. Elayne Snare.   LOS: 3 days    Dixie Dials  MD  04/10/2017, 9:57 AM

## 2017-04-10 NOTE — Progress Notes (Signed)
CRITICAL VALUE ALERT  Critical Value:  Sodium 116  Date & Time Notied:  04/10/17 0610  Provider Notified:  Will notify oncoming RN  Orders Received/Actions taken:

## 2017-04-11 LAB — COMPREHENSIVE METABOLIC PANEL
ALT: 98 U/L — AB (ref 17–63)
AST: 88 U/L — AB (ref 15–41)
Albumin: 3 g/dL — ABNORMAL LOW (ref 3.5–5.0)
Alkaline Phosphatase: 180 U/L — ABNORMAL HIGH (ref 38–126)
Anion gap: 8 (ref 5–15)
BUN: 10 mg/dL (ref 6–20)
CHLORIDE: 106 mmol/L (ref 101–111)
CO2: 22 mmol/L (ref 22–32)
CREATININE: 0.85 mg/dL (ref 0.61–1.24)
Calcium: 9.2 mg/dL (ref 8.9–10.3)
GFR calc Af Amer: >60 mL/min (ref >60)
GFR calc non Af Amer: >60 mL/min (ref >60)
Glucose, Bld: 116 mg/dL — ABNORMAL HIGH (ref 65–99)
POTASSIUM: 4.3 mmol/L (ref 3.5–5.1)
SODIUM: 136 mmol/L (ref 135–145)
Total Bilirubin: 0.7 mg/dL (ref 0.3–1.2)
Total Protein: 7.5 g/dL (ref 6.5–8.1)

## 2017-04-11 LAB — T4, FREE: Free T4: 1.5 ng/dL — ABNORMAL HIGH (ref 0.61–1.12)

## 2017-04-11 LAB — GLUCOSE, CAPILLARY
GLUCOSE-CAPILLARY: 215 mg/dL — AB (ref 65–99)
GLUCOSE-CAPILLARY: 196 mg/dL — AB (ref 65–99)
GLUCOSE-CAPILLARY: 169 mg/dL — AB (ref 65–99)
Glucose-Capillary: 112 mg/dL — ABNORMAL HIGH (ref 65–99)

## 2017-04-11 LAB — CORTISOL: Cortisol, Plasma: 18.2 ug/dL

## 2017-04-11 MED ORDER — DEXTROSE-NACL 5-0.9 % IV SOLN
INTRAVENOUS | Status: DC
  Administered 2017-04-13: 13:00:00 via INTRAVENOUS

## 2017-04-11 MED ORDER — BOOST / RESOURCE BREEZE PO LIQD
1.0000 | Freq: Three times a day (TID) | ORAL | Status: DC
  Administered 2017-04-12: 1 via ORAL

## 2017-04-11 NOTE — Care Management (Signed)
Went to see patient (who was there with his wife).  Neither patient nor wife speaks enough English for [place] to properly communicate.  [place] language phone interpreter used for 3-way conversation.  Utilizing this service, it is clear that the patient, even when using his native language, does not appear to understand and comprehend any of our conversation, and thus I don't feel is competent to make medical decisions.  Patient's wife was then put on phone with interpreter, and wife told interpreter that she doesn't make medical decisions and that her daughter does that.  I told patient/wife to have their daughter here tomorrow and I'll try my best to come by between 1 and 3 pm tomorrow.  In essence, I would be discussing whether or not to pursue sigmoidoscopy.  Patient, with his prior stroke, has trouble with swallowing chronically, and thus I don't think he could tolerate bowel preparation for complete colonoscopy.  Patient is on ticagrelor; would not be able to perform any biopsies until he is off this medication at least 3 days.  Over 30 minutes in today's encounter, and it required assistance of the nurse manager and charge nurse to help complete use of the phone interpreter system.

## 2017-04-11 NOTE — Care Management Note (Addendum)
Case Management Note  Patient Details  Name: Calvin Perez MRN: [id number] Date of Birth: Nov 23, 1940  Subjective/Objective:      Admitted with symptomatic hyponatremia              From home with wife.  Prior to admission [name] provided the following services: PT and HHA.  Sumiti Zwart (Spouse) Sheliah Mends (Daughter)    [phone]      PCP: Dixie Dials   Action/Plan: Plan is to d/c to home when medically stable with the resumption of home health services...Marland KitchenCM  following for d/c needs.  Expected Discharge Date:                  Expected Discharge Plan:  [name]  In-House Referral:     Discharge planning Services  CM Consult  Post Acute Care Choice:  Resumption of Svcs/PTA Provider Choice offered to:  Patient  DME Arranged:    DME Agency:     HH Arranged:  Nurse's Aide, PT [place]  Status of Service:  In process, will continue to follow  If discussed at Long Length of Stay Meetings, dates discussed:    Additional Comments:  Sharin Mons, RN 04/11/2017, 2:07 PM

## 2017-04-11 NOTE — Care Management Important Message (Signed)
Important Message  Patient Details  Name: Calvin Perez MRN: [id number] Date of Birth: Nov 23, 1940   Medicare Important Message Given:  Yes    Ivah Girardot Montine Circle 04/11/2017, 1:47 PM

## 2017-04-11 NOTE — Progress Notes (Signed)
Physical Therapy Treatment Patient Details Name: Calvin Perez MRN: [id number] DOB: September 15, 1941 Today's Date: 04/11/2017    History of Present Illness Patient is a 76 y/o male admitted with weakness, fatigue and cramps.  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  PMH positive for CABG 2006, cardiac stenting, DM, HTN.    PT Comments    Pt is progressing towards goals and is able to ambulate quicker, but requires cues for safety and turning. Language barrier makes it difficult to judge pt cognition levels and understanding. Pt requires mod assist for bed transfers other than scooting to EOB, which required min assist, and min assist for standing transfers and ambulation with RW. Vitals were stable and discharge plan is still appropriate.   Follow Up Recommendations  Home health PT     Equipment Recommendations  Rolling walker with 5" wheels    Recommendations for Other Services       Precautions / Restrictions Precautions Precautions: Fall Precaution Comments: Pt. gait speed is slow, but would have bouts of faster walking speed during ambulation. Restrictions Weight Bearing Restrictions: No    Mobility  Bed Mobility Overal bed mobility: Needs Assistance Bed Mobility: Sit to Supine;Supine to Sit;Sidelying to Sit   Sidelying to sit: Mod assist Supine to sit: Mod assist Sit to supine: Min assist   General bed mobility comments: assist to lift trunk, reach EOB - both required increased time. Pt needed mod assist to sit from supine but min assist to reach EOB. At first required assist to prevent posterior LOB.   Transfers Overall transfer level: Needs assistance Equipment used: Rolling walker (2 wheeled) Transfers: Sit to/from Stand Sit to Stand: Min assist         General transfer comment: Pt. stood with min assist and required vc and tactile cues for handplacement when standing to RW.  Ambulation/Gait Ambulation/Gait assistance: Min assist Ambulation  Distance (Feet): 200 Feet Assistive device: Rolling walker (2 wheeled) Gait Pattern/deviations: Step-through pattern;Decreased stride length;Shuffle Gait velocity: 1.37 ft/sec Gait velocity interpretation: <1.8 ft/sec, indicative of risk for recurrent falls General Gait Details: Assist and cues for walker control, cue for stride length and shuffling. No fatigue reported by pt during ambulation. Did have trouble turning to return to room which could be a language barrier issue. required verbal, tactile and visual cues to turn.   Stairs            Wheelchair Mobility    Modified Rankin (Stroke Patients Only)       Balance Overall balance assessment: Needs assistance Sitting-balance support: Feet supported;Single extremity supported Sitting balance-Leahy Scale: Poor Sitting balance - Comments: Required posterior support at first to prevent posterior LOB. Got better as treatment progressed, but still used one arm to prevent LOB and gain support. Postural control: Posterior lean;Left lateral lean Standing balance support: Single extremity supported;During functional activity Standing balance-Leahy Scale: Fair Standing balance comment: Used RW for support when standing                            Cognition Arousal/Alertness: Awake/alert Behavior During Therapy: WFL for tasks assessed/performed Overall Cognitive Status: Impaired/Different from baseline (Could be a result of language barrier) Area of Impairment: Following commands;Safety/judgement;Awareness;Problem solving                       Following Commands: Follows one step commands inconsistently;Follows one step commands with increased time;Follows multi-step commands inconsistently;Follows multi-step commands  with increased time Safety/Judgement: Decreased awareness of safety Awareness: Anticipatory;Emergent Problem Solving: Slow processing;Decreased initiation;Difficulty sequencing;Requires verbal  cues;Requires tactile cues General Comments: Language barrier could be a result of many of the cognition related issues.      Exercises General Exercises - Lower Extremity Long Arc Quad: AROM;Strengthening;Both;10 reps;Seated Hip Flexion/Marching: AROM;Strengthening;Both;Other reps (comment);Seated (8 reps) Toe Raises: AROM;Strengthening;10 reps;Seated    General Comments        Pertinent Vitals/Pain Pain Assessment: No/denies pain    Home Living                      Prior Function            PT Goals (current goals can now be found in the care plan section) Progress towards PT goals: Progressing toward goals    Frequency    Min 3X/week      PT Plan Current plan remains appropriate    Co-evaluation              AM-PAC PT "6 Clicks" Daily Activity  Outcome Measure                   End of Session Equipment Utilized During Treatment: Gait belt Activity Tolerance: Patient tolerated treatment well Patient left: in bed;with bed alarm set;with call bell/phone within reach;with family/visitor present   PT Visit Diagnosis: Other abnormalities of gait and mobility (R26.89);Muscle weakness (generalized) (M62.81)     Time: 1630-1701 PT Time Calculation (min) (ACUTE ONLY): 31 min  Charges:                       G Codes:       Rosalyn Charters, SPTA Office [phone]   Rosalyn Charters 04/11/2017, 5:08 PM

## 2017-04-11 NOTE — Progress Notes (Signed)
Ref: Dixie Dials, MD   Subjective:  Somewhat confused and weak. Discussed Abnormal CT scan findings with wife and daughter [name]. Patient may have rectal +/- bladder cancer with mets.   Objective:  Vital Signs in the last 24 hours: Temp:  [97.6 F (36.4 C)-98.2 F (36.8 C)] 98.2 F (36.8 C) (07/11 1402) Pulse Rate:  [75-98] 77 (07/11 1535) Cardiac Rhythm: Normal sinus rhythm;Heart block (07/11 0700) Resp:  [16-18] 16 (07/11 1402) BP: (143-154)/(61-81) 152/68 (07/11 1535) SpO2:  [97 %-100 %] 100 % (07/11 1535)  Physical Exam: BP Readings from Last 1 Encounters:  04/11/17 (!) 152/68    Wt Readings from Last 1 Encounters:  01/12/17 76.7 kg (169 lb)    Weight change:  There is no height or weight on file to calculate BMI. HEENT: [place]/AT, Eyes-Brown, PERL, EOMI, Conjunctiva-Pale pink, Sclera-Non-icteric Neck: No JVD, No bruit, Trachea midline. Lungs:  Clear, Bilateral. Cardiac:  Regular rhythm, normal S1 and S2, no S3. II/VI systolic murmur. Abdomen:  Soft, non-tender. BS present. Extremities:  No edema present. No cyanosis. No clubbing. CNS: AxOx3, Cranial nerves grossly intact, moves all 4 extremities.  Skin: Warm and dry.   Intake/Output from previous day: 07/10 0701 - 07/11 0700 In: -  Out: 1900 [Urine:1900]    Lab Results: BMET    Component Value Date/Time   NA 136 04/11/2017 0730   NA 116 (LL) 04/10/2017 0438   NA 119 (LL) 04/09/2017 0343   K 4.3 04/11/2017 0730   K 4.5 04/10/2017 0438   K 4.7 04/09/2017 0343   CL 106 04/11/2017 0730   CL 89 (L) 04/10/2017 0438   CL 88 (L) 04/09/2017 0343   CO2 22 04/11/2017 0730   CO2 22 04/10/2017 0438   CO2 24 04/09/2017 0343   GLUCOSE 116 (H) 04/11/2017 0730   GLUCOSE 116 (H) 04/10/2017 0438   GLUCOSE 181 (H) 04/09/2017 0343   BUN 10 04/11/2017 0730   BUN 9 04/10/2017 0438   BUN 7 04/09/2017 0343   CREATININE 0.85 04/11/2017 0730   CREATININE 0.83 04/10/2017 0438   CREATININE 0.71 04/09/2017 0343   CALCIUM 9.2  04/11/2017 0730   CALCIUM 8.2 (L) 04/10/2017 0438   CALCIUM 8.3 (L) 04/09/2017 0343   GFRNONAA >60 04/11/2017 0730   GFRNONAA >60 04/10/2017 0438   GFRNONAA >60 04/09/2017 0343   GFRAA >60 04/11/2017 0730   GFRAA >60 04/10/2017 0438   GFRAA >60 04/09/2017 0343   CBC    Component Value Date/Time   WBC 8.1 04/09/2017 0343   RBC 3.35 (L) 04/09/2017 0343   HGB 8.9 (L) 04/09/2017 0343   HCT 26.3 (L) 04/09/2017 0343   PLT 369 04/09/2017 0343   MCV 78.5 04/09/2017 0343   MCH 26.6 04/09/2017 0343   MCHC 33.8 04/09/2017 0343   RDW 15.1 04/09/2017 0343   LYMPHSABS 1.2 04/07/2017 1732   MONOABS 0.9 04/07/2017 1732   EOSABS 0.1 04/07/2017 1732   BASOSABS 0.0 04/07/2017 1732   HEPATIC Function Panel  Recent Labs  04/07/17 1732 04/08/17 0000 04/11/17 0730  PROT 7.3 7.0 7.5   HEMOGLOBIN A1C No components found for: HGA1C,  MPG CARDIAC ENZYMES Lab Results  Component Value Date   TROPONINI 4.14 ([place]) 09/17/2013   BNP No results for input(s): PROBNP in the last 8760 hours. TSH  Recent Labs  04/08/17 0124  TSH 1.020   CHOLESTEROL  Recent Labs  08/31/16 1012 01/10/17 0849 04/08/17 0124  CHOL 250* 114 102    Scheduled Meds: .  aspirin EC  81 mg Oral Daily  . atorvastatin  40 mg Oral q1800  . enoxaparin (LOVENOX) injection  40 mg Subcutaneous Daily  . insulin aspart  0-9 Units Subcutaneous TID WC  . insulin glargine  30 Units Subcutaneous QHS  . isosorbide mononitrate  30 mg Oral Daily  . losartan  25 mg Oral Daily  . metoprolol tartrate  25 mg Oral BID   Continuous Infusions: . sodium chloride 40 mL/hr at 04/11/17 0938  . dextrose 5 % and 0.9% NaCl Stopped (04/11/17 0938)   PRN Meds:.acetaminophen, nitroGLYCERIN  Assessment/Plan: Rectal and bladder mass SIADH Generalized weakness Hypertension Hyperlipidemia Anemia of chronic disease Type 2 DM, II Coronary artery disease CABG S/P stroke with right sided weakness  Discussed care with family. Hold  Brilinta.   LOS: 4 days    Dixie Dials  MD  04/11/2017, 5:49 PM

## 2017-04-11 NOTE — Progress Notes (Signed)
Subjective:  The patient has no complaints today  Although his urine osmolality was not significantly high on repeat testing yesterday he has responded to a single dose of [place] 15 mg daily only His wife says that he is eating a little better and trying to drink some water but not much fluid intake recorded poorly At this time has IV fluids appears to be discontinued His wife was not made aware that he has a likely malignancy diagnosed on his CT scan and patient is not aware of his results  Not clear if he had an episode of hypoglycemia today, reportedly was feeling weak and confused:  CBG (last 3)   Recent Labs  04/10/17 2220 04/11/17 0739 04/11/17 1223  GLUCAP 139* 112* 215*      Objective:  He is conversing but appears to be still a little confused He thinks he was seen by his PCP today but he cannot remember the details   Vital signs in last 24 hours: Temp:  [97.6 F (36.4 C)-97.8 F (36.6 C)] 97.8 F (36.6 C) (07/11 0528) Pulse Rate:  [73-98] 83 (07/11 0528) Resp:  [16-18] 18 (07/11 0528) BP: (138-154)/(61-81) 152/61 (07/11 0528) SpO2:  [97 %-100 %] 98 % (07/11 0528) Weight change:  Last BM Date: 04/08/17  Intake/Output from previous day: 07/10 0701 - 07/11 0700 In: -  Out: 1900 [Urine:1900] Intake/Output this shift: Total I/O In: 220 [P.O.:220] Out: 300 [Urine:300]    Lab Results:  Recent Labs  04/09/17 0343  WBC 8.1  HGB 8.9*  HCT 26.3*  PLT 369   BMET  Recent Labs  04/10/17 0438 04/11/17 0730  NA 116* 136  K 4.5 4.3  CL 89* 106  CO2 22 22  GLUCOSE 116* 116*  BUN 9 10  CREATININE 0.83 0.85  CALCIUM 8.2* 9.2    Studies/Results: Dg Chest 2 View  Result Date: 04/10/2017 CLINICAL DATA:  Weakness and fatigue for 10 days, history hypertension, diabetes mellitus, coronary artery disease post stenting EXAM: CHEST  2 VIEW COMPARISON:  09/17/2013 FINDINGS: Normal heart size post CABG. Mediastinal contours and pulmonary vascularity  normal. Scarring at RIGHT base. Lungs otherwise clear. No acute infiltrate, pleural effusion or pneumothorax. RIGHT carotid stent. No acute osseous findings. IMPRESSION: Post CABG and RIGHT carotid stenting. RIGHT basilar scarring without acute infiltrate. Electronically Signed   By: Lavonia Dana M.D.   On: 04/10/2017 11:16   Ct Abdomen Pelvis W Contrast  Result Date: 04/10/2017 CLINICAL DATA:  Anemia and decreased appetite. EXAM: CT ABDOMEN AND PELVIS WITH CONTRAST TECHNIQUE: Multidetector CT imaging of the abdomen and pelvis was performed using the standard protocol following bolus administration of intravenous contrast. CONTRAST:  170mL ISOVUE-300 IOPAMIDOL (ISOVUE-300) INJECTION 61% COMPARISON:  None. FINDINGS: Lower chest: Coronary artery calcifications, patient is post CABG. Scattered linear atelectasis in the lung bases. Hepatobiliary: Patchy hepatic enhancement with multiple low-density lesions scattered throughout the liver. Lesions are ill-defined, largest measuring 14 mm. Suggestion of peripheral enhancement of some of these lesions which suggests hemangiomas, however incompletely characterized. Gallbladder physiologically distended, no calcified stone. No biliary dilatation. Pancreas: Parenchymal atrophy. No ductal dilatation or inflammation. Spleen: Normal in size without focal abnormality. Small splenule anteriorly. Adrenals/Urinary Tract: No adrenal nodule. There is no perinephric edema, there is symmetric prominence of both renal collecting systems. Both ureters are prominent to the bladder insertion. No urolithiasis. There is asymmetric bladder wall thickening involving the left urinary bladder up to 6 mm, with moderate surrounding soft tissue stranding. Sigmoid colon abuts  the bladder dome, however are fat plane is preserved and stranding appears centered on the bladder rather than the colon. Stomach/Bowel: Stomach physiologically distended. Small duodenum diverticulum. No small bowel  inflammation, obstruction or wall thickening. Enteric contrast reaches the cecum. The appendix is air-filled without periappendiceal inflammation. Small to moderate colonic stool burden. Sigmoid colon abuts the left aspect of the urinary bladder, however fat plane is preserved. Question of irregular wall thickening of the rectum anteriorly. Vascular/Lymphatic: Arterial vascular calcifications. Pelvic adenopathy, greatest in the left iliac stations. Enlarged left external iliac node measures 3.8 x 1.6 cm image 80 series 3. External iliac adenopathy within 1.4 cm node image 74 series 3. Multiple additional prominent left common and external iliac nodes are seen. Left common iliac vein is not well-defined. Left retroperitoneal node at the iliac bifurcation measures 10 mm image 54 series 3. There is a 12 mm aortocaval node image 44. Additional smaller retroperitoneal nodes are seen. Enlarged right external iliac nodes, for example measuring 10 mm short axis image 74. Reproductive: Prostate gland appears heterogeneous but normal in size. Other: No ascites. No free air. No intra-abdominal abscess. Soft tissue densities and air in the anterior abdominal wall consistent with subcutaneous injections. Musculoskeletal: No lytic or blastic osseous lesions. IMPRESSION: 1. Pelvic and retroperitoneal adenopathy highly suspicious for metastatic disease. Suspect primary being left aspect of the urinary bladder where there is irregular wall thickening and adjacent soft tissue stranding. Alternatively, suggestion of irregular anterior rectal wall thickening could be primary rectal malignancy. Recommend direct visualization of both of these regions with cystoscopy and sigmoidoscopy/colonoscopy. 2. There is prominence of both renal collecting systems without Matson hydronephrosis. 3. Low-density lesions scattered throughout the liver, nonspecific. These may reflect hemangiomas, however in the setting of possible malignancy, metastasis  rule out recommended. PET-CT versus hepatic protocol MRI based on direct visualization results. 4. Advanced aortic atherosclerosis. 5. Left external iliac adenopathy may cause mass-effect on the left external iliac vein which is not well-defined, consider lower extremity duplex for evaluation of DVT. These results will be called to the ordering clinician or representative by the Radiologist Assistant, and communication documented in the PACS or zVision Dashboard. Electronically Signed   By: Jeb Levering M.D.   On: 04/10/2017 19:53    Medications: . aspirin EC  81 mg Oral Daily  . atorvastatin  40 mg Oral q1800  . enoxaparin (LOVENOX) injection  40 mg Subcutaneous Daily  . insulin aspart  0-9 Units Subcutaneous TID WC  . insulin glargine  30 Units Subcutaneous QHS  . isosorbide mononitrate  30 mg Oral Daily  . losartan  25 mg Oral Daily  . metoprolol tartrate  25 mg Oral BID  . 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

## 2017-04-11 NOTE — Progress Notes (Signed)
Ref: Dixie Dials, MD   Subjective:  Complains of weakness and dizziness. Patient has very poor oral intake. Hyponatremia apparently corrected.  Objective:  Vital Signs in the last 24 hours: Temp:  [97.6 F (36.4 C)-97.8 F (36.6 C)] 97.8 F (36.6 C) (07/11 0528) Pulse Rate:  [73-98] 83 (07/11 0528) Cardiac Rhythm: Normal sinus rhythm;Heart block (07/11 0700) Resp:  [16-18] 18 (07/11 0528) BP: (138-154)/(61-81) 152/61 (07/11 0528) SpO2:  [97 %-100 %] 98 % (07/11 0528)  Physical Exam: BP Readings from Last 1 Encounters:  04/11/17 (!) 152/61    Wt Readings from Last 1 Encounters:  01/12/17 76.7 kg (169 lb)    Weight change:  There is no height or weight on file to calculate BMI. HEENT: [place]/AT, Eyes-Brown, PERL, EOMI, Conjunctiva-Pale pink, Sclera-Non-icteric. Neck: No JVD, No bruit, Trachea midline. Lungs:  Clear, Bilateral. Cardiac:  Regular rhythm, normal S1 and S2, no S3. II/VI systolic murmur. Abdomen:  Soft, non-tender. BS present. Extremities:  No edema present. No cyanosis. No clubbing. CNS: AxOx3, Cranial nerves grossly intact, moves all 4 extremities.  Skin: Warm and dry.   Intake/Output from previous day: 07/10 0701 - 07/11 0700 In: -  Out: 1900 [Urine:1900]    Lab Results: BMET    Component Value Date/Time   NA 136 04/11/2017 0730   NA 116 (LL) 04/10/2017 0438   NA 119 (LL) 04/09/2017 0343   K 4.3 04/11/2017 0730   K 4.5 04/10/2017 0438   K 4.7 04/09/2017 0343   CL 106 04/11/2017 0730   CL 89 (L) 04/10/2017 0438   CL 88 (L) 04/09/2017 0343   CO2 22 04/11/2017 0730   CO2 22 04/10/2017 0438   CO2 24 04/09/2017 0343   GLUCOSE 116 (H) 04/11/2017 0730   GLUCOSE 116 (H) 04/10/2017 0438   GLUCOSE 181 (H) 04/09/2017 0343   BUN 10 04/11/2017 0730   BUN 9 04/10/2017 0438   BUN 7 04/09/2017 0343   CREATININE 0.85 04/11/2017 0730   CREATININE 0.83 04/10/2017 0438   CREATININE 0.71 04/09/2017 0343   CALCIUM 9.2 04/11/2017 0730   CALCIUM 8.2 (L)  04/10/2017 0438   CALCIUM 8.3 (L) 04/09/2017 0343   GFRNONAA >60 04/11/2017 0730   GFRNONAA >60 04/10/2017 0438   GFRNONAA >60 04/09/2017 0343   GFRAA >60 04/11/2017 0730   GFRAA >60 04/10/2017 0438   GFRAA >60 04/09/2017 0343   CBC    Component Value Date/Time   WBC 8.1 04/09/2017 0343   RBC 3.35 (L) 04/09/2017 0343   HGB 8.9 (L) 04/09/2017 0343   HCT 26.3 (L) 04/09/2017 0343   PLT 369 04/09/2017 0343   MCV 78.5 04/09/2017 0343   MCH 26.6 04/09/2017 0343   MCHC 33.8 04/09/2017 0343   RDW 15.1 04/09/2017 0343   LYMPHSABS 1.2 04/07/2017 1732   MONOABS 0.9 04/07/2017 1732   EOSABS 0.1 04/07/2017 1732   BASOSABS 0.0 04/07/2017 1732   HEPATIC Function Panel  Recent Labs  04/07/17 1732 04/08/17 0000 04/11/17 0730  PROT 7.3 7.0 7.5   HEMOGLOBIN A1C No components found for: HGA1C,  MPG CARDIAC ENZYMES Lab Results  Component Value Date   TROPONINI 4.14 ([place]) 09/17/2013   BNP No results for input(s): PROBNP in the last 8760 hours. TSH  Recent Labs  04/08/17 0124  TSH 1.020   CHOLESTEROL  Recent Labs  08/31/16 1012 01/10/17 0849 04/08/17 0124  CHOL 250* 114 102    Scheduled Meds: . aspirin EC  81 mg Oral Daily  . atorvastatin  40 mg Oral q1800  . enoxaparin (LOVENOX) injection  40 mg Subcutaneous Daily  . insulin aspart  0-9 Units Subcutaneous TID WC  . insulin glargine  30 Units Subcutaneous QHS  . isosorbide mononitrate  30 mg Oral Daily  . losartan  25 mg Oral Daily  . metoprolol tartrate  25 mg Oral BID  . ticagrelor  90 mg Oral BID   Continuous Infusions: . sodium chloride 75 mL/hr at 04/11/17 0141  . dextrose 5 % and 0.9% NaCl     PRN Meds:.acetaminophen, nitroGLYCERIN  Assessment/Plan: Possible rectal or bladder mass rule out malignancy SIADH Generalized weakness History of hypertension Hyperlipidemia Anemia of chronic disease Type 2 diabetes mellitus Coronary artery disease CABG Status post stroke with right-sided weakness  GI and  a urology consults. Appreciate Dr. Ronnie Derby consult.   LOS: 4 days    Dixie Dials  MD  04/11/2017, 9:23 AM

## 2017-04-12 LAB — GLUCOSE, CAPILLARY
Glucose-Capillary: 61 mg/dL — ABNORMAL LOW (ref 65–99)
Glucose-Capillary: 144 mg/dL — ABNORMAL HIGH (ref 65–99)
Glucose-Capillary: 213 mg/dL — ABNORMAL HIGH (ref 65–99)
Glucose-Capillary: 241 mg/dL — ABNORMAL HIGH (ref 65–99)
Glucose-Capillary: 145 mg/dL — ABNORMAL HIGH (ref 65–99)

## 2017-04-12 LAB — SODIUM: SODIUM: 132 mmol/L — AB (ref 135–145)

## 2017-04-12 MED ORDER — STARCH (THICKENING) PO POWD
ORAL | Status: DC | PRN
  Filled 2017-04-12: qty 227

## 2017-04-12 MED ORDER — MILK AND MOLASSES ENEMA
1.0000 | Freq: Once | RECTAL | Status: AC
  Administered 2017-04-13: 250 mL via RECTAL
  Filled 2017-04-12: qty 250

## 2017-04-12 MED ORDER — BISACODYL 10 MG RE SUPP
10.0000 mg | Freq: Once | RECTAL | Status: AC
  Administered 2017-04-12: 10 mg via RECTAL
  Filled 2017-04-12: qty 1

## 2017-04-12 MED ORDER — LOSARTAN POTASSIUM 50 MG PO TABS
50.0000 mg | ORAL_TABLET | Freq: Every day | ORAL | Status: DC
  Administered 2017-04-12 – 2017-04-14 (×3): 50 mg via ORAL
  Filled 2017-04-12 (×3): qty 1

## 2017-04-12 MED ORDER — RESOURCE THICKENUP CLEAR PO POWD
ORAL | Status: DC | PRN
  Filled 2017-04-12: qty 125

## 2017-04-12 MED ORDER — GLUCERNA SHAKE PO LIQD
237.0000 mL | Freq: Three times a day (TID) | ORAL | Status: DC
  Administered 2017-04-12 – 2017-04-14 (×7): 237 mL via ORAL

## 2017-04-12 NOTE — Consult Note (Signed)
[facility] Gastroenterology Consultation Note  Referring Provider: Dr. Doylene Canard Primary Care Physician:  Dixie Dials, MD  Reason for Consultation:  Abnormal CT scan  HPI: Calvin Perez is a 76 y.o. male admitted with confusion, hyponatremia.  Found to have anemia and CT showed lesion in rectum +/- bladder with adenopathy worrisome for locally expansive malignancy.  Through discussion with daughter, Calvin Perez, as interpreter, patient denies abdominal pain or change in bowel habits or blood in stool.  Has had some weight loss.  Is on ticagrelor for stroke few years ago.   Past Medical History:  Diagnosis Date  . Arthritis   . Coronary artery disease   . Diabetes mellitus    insulin dependent  . Dyslipidemia   . Hypertension     Past Surgical History:  Procedure Laterality Date  . CARDIAC CATHETERIZATION    . CORONARY ARTERY BYPASS GRAFT    . LEFT HEART CATHETERIZATION WITH CORONARY ANGIOGRAM N/A 09/15/2013   Procedure: LEFT HEART CATHETERIZATION WITH CORONARY ANGIOGRAM;  Surgeon: Clent Demark, MD;  Location: [place] CATH LAB;  Service: Cardiovascular;  Laterality: N/A;  . open heart surgery  1998  . PERCUTANEOUS STENT INTERVENTION  09/15/2013   Procedure: PERCUTANEOUS STENT INTERVENTION;  Surgeon: Clent Demark, MD;  Location: [place] CATH LAB;  Service: Cardiovascular;;    Prior to Admission medications   Medication Sig Start Date End Date Taking? Authorizing Provider  aspirin 81 MG tablet Take 81 mg by mouth daily.   Yes [provider]  atorvastatin (LIPITOR) 80 MG tablet Take 1 tablet (80 mg total) by mouth daily. 11/05/14  Yes Elayne Snare, MD  colesevelam [facility]) 625 MG tablet Take 3 tablets twice a day Patient taking differently: Take 1,875 mg by mouth 2 (two) times daily with a meal.  12/15/13  Yes Elayne Snare, MD  furosemide (LASIX) 40 MG tablet Take 40 mg by mouth daily.   Yes [provider]  gabapentin (NEURONTIN) 300 MG capsule Take 1 capsule (300 mg total) by  mouth 3 (three) times daily. Patient taking differently: Take 100 mg by mouth 3 (three) times daily.  09/07/16  Yes Elayne Snare, MD  insulin NPH Human (NOVOLIN N) 100 UNIT/ML injection In AM 25 units and bedtime 25 units. Patient taking differently: Inject 10-45 Units into the skin See admin instructions. Inject 45 units SQ in the morning and inject 10 units SQ at bedtime 07/21/16  Yes Elayne Snare, MD  isosorbide mononitrate (IMDUR) 30 MG 24 hr tablet Take 1 tablet (30 mg total) by mouth daily. 09/18/13  Yes Dixie Dials, MD  KLOR-CON 10 10 MEQ tablet Take 10 mEq by mouth daily. 10/29/14  Yes [provider]  metoprolol tartrate (LOPRESSOR) 12.5 mg TABS tablet Take 0.5 tablets (12.5 mg total) by mouth 2 (two) times daily. 09/18/13  Yes Dixie Dials, MD  valsartan (DIOVAN) 160 MG tablet TAKE 1 TABLET (160 MG TOTAL) BY MOUTH DAILY. 04/02/17  Yes Elayne Snare, MD  ACCU-CHEK AVIVA PLUS test strip USE AS DIRECTED 3 TIMES A DAY Patient not taking: Reported on 04/09/2017 04/10/16   Elayne Snare, MD  insulin aspart (NOVOLOG) 100 UNIT/ML injection Inject 15-30-25 with meals, Patient taking differently: Inject 10-30 Units into the skin See admin instructions. Inject 10 units SQ at breakfast, 30 units SQ at lunch and 25 units SQ at dinner 02/11/16   Elayne Snare, MD  insulin lispro (HUMALOG) 100 UNIT/ML injection Take before breakfast 10 units, lunch time 25 and dinner 30 units  Patient not  taking: Reported on 04/09/2017 09/07/16   Elayne Snare, MD  insulin NPH Human (NOVOLIN N RELION) 100 UNIT/ML injection morning 35 units and bedtime 25 units Patient not taking: Reported on 04/09/2017 09/07/16   Elayne Snare, MD  insulin regular (NOVOLIN R) 100 units/mL injection 10 units at breakfast, 25 units at lunch, 25 units at dinner. 15 minutes before meals. Patient not taking: Reported on 04/09/2017 07/21/16   Elayne Snare, MD  metFORMIN (GLUCOPHAGE-XR) 500 MG 24 hr tablet TAKE 3 TABLETS BY MOUTH ONCE DAILY WITH  SUPPER Patient not taking: Reported on 04/09/2017 02/28/17   Elayne Snare, MD  nitroGLYCERIN (NITROSTAT) 0.4 MG SL tablet Place 1 tablet (0.4 mg total) under the tongue every 5 (five) minutes x 3 doses as needed for chest pain. 09/18/13   Dixie Dials, MD  Ticagrelor (BRILINTA) 90 MG TABS tablet Take 1 tablet (90 mg total) by mouth 2 (two) times daily. Patient not taking: Reported on 04/09/2017 09/18/13   Dixie Dials, MD    Current Facility-Administered Medications  Medication Dose Route Frequency Provider Last Rate Last Dose  . 0.9 %  sodium chloride infusion   Intravenous Continuous Dixie Dials, MD 30 mL/hr at 04/12/17 1018    . acetaminophen (TYLENOL) tablet 650 mg  650 mg Oral Q6H PRN Charolette Forward, MD   650 mg at 04/09/17 2142  . aspirin EC tablet 81 mg  81 mg Oral Daily Charolette Forward, MD   81 mg at 04/12/17 1019  . atorvastatin (LIPITOR) tablet 40 mg  40 mg Oral q1800 Charolette Forward, MD   40 mg at 04/11/17 1732  . dextrose 5 %-0.9 % sodium chloride infusion   Intravenous Continuous Dixie Dials, MD   Stopped at 04/11/17 [phone]  . enoxaparin (LOVENOX) injection 40 mg  40 mg Subcutaneous Daily Charolette Forward, MD   40 mg at 04/12/17 1018  . feeding supplement (GLUCERNA SHAKE) (GLUCERNA SHAKE) liquid 237 mL  237 mL Oral TID BM Harwani, Mohan, MD      . insulin aspart (novoLOG) injection 0-9 Units  0-9 Units Subcutaneous TID WC Charolette Forward, MD   3 Units at 04/12/17 1220  . insulin glargine (LANTUS) injection 30 Units  30 Units Subcutaneous QHS Charolette Forward, MD   30 Units at 04/11/17 2158  . isosorbide mononitrate (IMDUR) 24 hr tablet 30 mg  30 mg Oral Daily Charolette Forward, MD   30 mg at 04/12/17 1019  . losartan (COZAAR) tablet 50 mg  50 mg Oral Daily Dixie Dials, MD   50 mg at 04/12/17 1018  . metoprolol tartrate (LOPRESSOR) tablet 25 mg  25 mg Oral BID Charolette Forward, MD   25 mg at 04/12/17 1018  . nitroGLYCERIN (NITROSTAT) SL tablet 0.4 mg  0.4 mg Sublingual Q5 Min x 3 PRN Charolette Forward, MD        Allergies as of 04/07/2017  . (No Known Allergies)    History reviewed. No pertinent family history.  Social History   Social History  . Marital status: Married    Spouse name: sumitra  . Number of children: 2  . Years of education: 12   Occupational History  . retired    Social History Main Topics  . Smoking status: Never Smoker  . Smokeless tobacco: Never Used  . Alcohol use No  . Drug use: No  . Sexual activity: Not Currently   Other Topics Concern  . Not on file   Social History Narrative  . No narrative on file  Review of Systems: As per HPI, all others negative  Physical Exam: Vital signs in last 24 hours: Temp:  [97.5 F (36.4 C)-97.7 F (36.5 C)] 97.5 F (36.4 C) (07/12 0550) Pulse Rate:  [67-77] 67 (07/12 0550) Resp:  [18] 18 (07/12 0550) BP: (148-153)/(65-70) 153/67 (07/12 0550) SpO2:  [94 %-100 %] 100 % (07/12 0550) Weight:  [75.9 kg (167 lb 4.8 oz)] 75.9 kg (167 lb 4.8 oz) (07/12 0550) Last BM Date: 04/11/17 General:   Alert,  Well-developed, well-nourished, pleasant and cooperative in NAD Abdomen:  Soft, nontender and nondistended. No masses, hepatosplenomegaly or hernias noted. Normal bowel sounds, without guarding, and without rebound.     Msk:  Symmetrical without gross deformities. Normal posture. Pulses:  Normal pulses noted. Extremities:  Without clubbing or edema. Neurologic:  Alert and  oriented x4; right hemiparesis; otherwise grossly normal neurologically. Skin:  Intact without significant lesions or rashes. Psych:  Alert and cooperative. Normal mood and affect.   Lab Results: No results for input(s): WBC, HGB, HCT, PLT in the last 72 hours. BMET  Recent Labs  04/10/17 0438 04/11/17 0730 04/12/17 0351  NA 116* 136 132*  K 4.5 4.3  --   CL 89* 106  --   CO2 22 22  --   GLUCOSE 116* 116*  --   BUN 9 10  --   CREATININE 0.83 0.85  --   CALCIUM 8.2* 9.2  --    LFT  Recent Labs  04/11/17 0730  PROT  7.5  ALBUMIN 3.0*  AST 88*  ALT 98*  ALKPHOS 180*  BILITOT 0.7   PT/INR No results for input(s): LABPROT, INR in the last 72 hours.  Studies/Results: Ct Abdomen Pelvis W Contrast  Result Date: 04/10/2017 CLINICAL DATA:  Anemia and decreased appetite. EXAM: CT ABDOMEN AND PELVIS WITH CONTRAST TECHNIQUE: Multidetector CT imaging of the abdomen and pelvis was performed using the standard protocol following bolus administration of intravenous contrast. CONTRAST:  166mL ISOVUE-300 IOPAMIDOL (ISOVUE-300) INJECTION 61% COMPARISON:  None. FINDINGS: Lower chest: Coronary artery calcifications, patient is post CABG. Scattered linear atelectasis in the lung bases. Hepatobiliary: Patchy hepatic enhancement with multiple low-density lesions scattered throughout the liver. Lesions are ill-defined, largest measuring 14 mm. Suggestion of peripheral enhancement of some of these lesions which suggests hemangiomas, however incompletely characterized. Gallbladder physiologically distended, no calcified stone. No biliary dilatation. Pancreas: Parenchymal atrophy. No ductal dilatation or inflammation. Spleen: Normal in size without focal abnormality. Small splenule anteriorly. Adrenals/Urinary Tract: No adrenal nodule. There is no perinephric edema, there is symmetric prominence of both renal collecting systems. Both ureters are prominent to the bladder insertion. No urolithiasis. There is asymmetric bladder wall thickening involving the left urinary bladder up to 6 mm, with moderate surrounding soft tissue stranding. Sigmoid colon abuts the bladder dome, however are fat plane is preserved and stranding appears centered on the bladder rather than the colon. Stomach/Bowel: Stomach physiologically distended. Small duodenum diverticulum. No small bowel inflammation, obstruction or wall thickening. Enteric contrast reaches the cecum. The appendix is air-filled without periappendiceal inflammation. Small to moderate colonic stool  burden. Sigmoid colon abuts the left aspect of the urinary bladder, however fat plane is preserved. Question of irregular wall thickening of the rectum anteriorly. Vascular/Lymphatic: Arterial vascular calcifications. Pelvic adenopathy, greatest in the left iliac stations. Enlarged left external iliac node measures 3.8 x 1.6 cm image 80 series 3. External iliac adenopathy within 1.4 cm node image 74 series 3. Multiple additional prominent left common and external iliac  nodes are seen. Left common iliac vein is not well-defined. Left retroperitoneal node at the iliac bifurcation measures 10 mm image 54 series 3. There is a 12 mm aortocaval node image 44. Additional smaller retroperitoneal nodes are seen. Enlarged right external iliac nodes, for example measuring 10 mm short axis image 74. Reproductive: Prostate gland appears heterogeneous but normal in size. Other: No ascites. No free air. No intra-abdominal abscess. Soft tissue densities and air in the anterior abdominal wall consistent with subcutaneous injections. Musculoskeletal: No lytic or blastic osseous lesions. IMPRESSION: 1. Pelvic and retroperitoneal adenopathy highly suspicious for metastatic disease. Suspect primary being left aspect of the urinary bladder where there is irregular wall thickening and adjacent soft tissue stranding. Alternatively, suggestion of irregular anterior rectal wall thickening could be primary rectal malignancy. Recommend direct visualization of both of these regions with cystoscopy and sigmoidoscopy/colonoscopy. 2. There is prominence of both renal collecting systems without Ayodele hydronephrosis. 3. Low-density lesions scattered throughout the liver, nonspecific. These may reflect hemangiomas, however in the setting of possible malignancy, metastasis rule out recommended. PET-CT versus hepatic protocol MRI based on direct visualization results. 4. Advanced aortic atherosclerosis. 5. Left external iliac adenopathy may cause  mass-effect on the left external iliac vein which is not well-defined, consider lower extremity duplex for evaluation of DVT. These results will be called to the ordering clinician or representative by the Radiologist Assistant, and communication documented in the PACS or zVision Dashboard. Electronically Signed   By: Jeb Levering M.D.   On: 04/10/2017 19:53   Impression:  1.  Weakness, anemia. 2.  Weight loss. 3.  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 4.  Stroke couple years ago, on ticagrelor. 5.  Hyponatremia, much improved, now sodium 132.  Plan:  1.  Holding ticagrelor. 2.  Sonal, patient's daughter, served as Astronomer.  I discussed doing sigmoidoscopy with biopsies (will have been off ticagrelor 2 days by tomorrow's procedure), and Sonal agrees.  I don't think patient, given his stroke, could tolerate bowel preparation for complete colonoscopy. 3.  Next step in management pending sigmoidoscopy findings. 4.  Risks (bleeding, infection, bowel perforation that could require surgery, sedation-related changes in cardiopulmonary systems), benefits (identification and possible treatment of source of symptoms, exclusion of certain causes of symptoms), and alternatives (watchful waiting, radiographic imaging studies, empiric medical treatment) of sigmoidoscopy were explained to patient/family in detail and patient wishes to proceed. 5.  [facility] will follow.   LOS: 5 days   Marbeth Smedley M  04/12/2017, 2:10 PM  Pager [phone] If no answer or after 5 PM call [phone]

## 2017-04-12 NOTE — Progress Notes (Signed)
Inpatient Diabetes Program Recommendations  AACE/ADA: New Consensus Statement on Inpatient Glycemic Control (2015)  Target Ranges:  Prepandial:   less than 140 mg/dL      Peak postprandial:   less than 180 mg/dL (1-2 hours)      Critically ill patients:  140 - 180 mg/dL   Results for KIJUAN, GALLICCHIO (MRN [id number]) as of 04/12/2017 11:41  Ref. Range 04/11/2017 07:39 04/11/2017 12:23 04/11/2017 16:54 04/11/2017 21:44 04/12/2017 08:15 04/12/2017 08:39  Glucose-Capillary Latest Ref Range: 65 - 99 mg/dL 112 (H) 215 (H) 196 (H) 169 (H) 61 (L) 144 (H)   Review of Glycemic Control  Current orders for Inpatient glycemic control: Lantus 30 units QHS, Novolog 0-9 units TID with meals  Inpatient Diabetes Program Recommendations: Insulin - Basal: Fasting glucose 61 mg/dl this morning. Please consider decreasing Lantus to 28 units QHS.  Thanks, Barnie Alderman, RN, MSN, CDE Diabetes Coordinator Inpatient Diabetes Program [phone] (Team Pager from 8am to 5pm)

## 2017-04-13 ENCOUNTER — Inpatient Hospital Stay (HOSPITAL_COMMUNITY): Payer: Medicare HMO

## 2017-04-13 ENCOUNTER — Ambulatory Visit: Payer: Self-pay | Admitting: Endocrinology

## 2017-04-13 ENCOUNTER — Encounter (HOSPITAL_COMMUNITY)
Admission: EM | Disposition: A | Discharge status: Home Health | Payer: Self-pay | Source: Home / Self Care | Attending: Cardiology

## 2017-04-13 ENCOUNTER — Encounter (HOSPITAL_COMMUNITY): Payer: Self-pay | Admitting: *Deleted

## 2017-04-13 HISTORY — PX: FLEXIBLE SIGMOIDOSCOPY: SHX5431

## 2017-04-13 LAB — URINALYSIS, ROUTINE W REFLEX MICROSCOPIC
Bilirubin Urine: NEGATIVE
Glucose, UA: NEGATIVE mg/dL
Hgb urine dipstick: NEGATIVE
KETONES UR: NEGATIVE mg/dL
LEUKOCYTES UA: NEGATIVE
Nitrite: NEGATIVE
PROTEIN: NEGATIVE mg/dL
Specific Gravity, Urine: 1.015 (ref 1.005–1.030)
pH: 6 (ref 5.0–8.0)

## 2017-04-13 LAB — GLUCOSE, CAPILLARY
GLUCOSE-CAPILLARY: 60 mg/dL — AB (ref 65–99)
GLUCOSE-CAPILLARY: 116 mg/dL — AB (ref 65–99)
GLUCOSE-CAPILLARY: 161 mg/dL — AB (ref 65–99)
GLUCOSE-CAPILLARY: 329 mg/dL — AB (ref 65–99)
Glucose-Capillary: 154 mg/dL — ABNORMAL HIGH (ref 65–99)

## 2017-04-13 LAB — ECHOCARDIOGRAM COMPLETE
Height: 63 in
Weight: 2676.8 oz

## 2017-04-13 LAB — SODIUM: SODIUM: 130 mmol/L — AB (ref 135–145)

## 2017-04-13 SURGERY — SIGMOIDOSCOPY, FLEXIBLE
Anesthesia: Moderate Sedation | Laterality: Left

## 2017-04-13 MED ORDER — DEXTROSE 50 % IV SOLN
INTRAVENOUS | Status: AC
  Administered 2017-04-13: 20 mL
  Filled 2017-04-13: qty 50

## 2017-04-13 MED ORDER — DEMECLOCYCLINE HCL 150 MG PO TABS
150.0000 mg | ORAL_TABLET | Freq: Two times a day (BID) | ORAL | Status: DC
  Administered 2017-04-13 – 2017-04-14 (×3): 150 mg via ORAL
  Filled 2017-04-13 (×4): qty 1

## 2017-04-13 MED ORDER — DEXTROSE 50 % IV SOLN: 25.0000 mL | Freq: Once | INTRAVENOUS | Status: DC

## 2017-04-13 MED ORDER — MIDAZOLAM HCL 5 MG/ML IJ SOLN
INTRAMUSCULAR | Status: AC
Start: 2017-04-13 — End: ?
  Filled 2017-04-13: qty 2

## 2017-04-13 MED ORDER — MIDAZOLAM HCL 10 MG/2ML IJ SOLN
INTRAMUSCULAR | Status: DC | PRN
  Administered 2017-04-13: 2 mg via INTRAVENOUS

## 2017-04-13 MED ORDER — FENTANYL CITRATE (PF) 100 MCG/2ML IJ SOLN
INTRAMUSCULAR | Status: DC | PRN
  Administered 2017-04-13: 25 ug via INTRAVENOUS

## 2017-04-13 MED ORDER — FENTANYL CITRATE (PF) 100 MCG/2ML IJ SOLN
INTRAMUSCULAR | Status: AC
  Filled 2017-04-13: qty 2

## 2017-04-13 MED ORDER — SODIUM CHLORIDE 0.9 % IV SOLN
INTRAVENOUS | Status: DC
  Administered 2017-04-13: 500 mL via INTRAVENOUS

## 2017-04-13 NOTE — Progress Notes (Signed)
Clarified with dr Doylene Canard patient should have both normal saline at 30 ml/hr and 5% dextrose and 0.9% sodium chloride

## 2017-04-13 NOTE — Progress Notes (Signed)
Inpatient Diabetes Program Recommendations  AACE/ADA: New Consensus Statement on Inpatient Glycemic Control (2015)  Target Ranges:  Prepandial:   less than 140 mg/dL      Peak postprandial:   less than 180 mg/dL (1-2 hours)      Critically ill patients:  140 - 180 mg/dL   Results for FIELDS, OROS (MRN [id number]) as of 04/13/2017 10:56  Ref. Range 04/12/2017 08:15 04/12/2017 08:39 04/12/2017 12:12 04/12/2017 16:59 04/12/2017 21:32 04/13/2017 07:16 04/13/2017 07:45  Glucose-Capillary Latest Ref Range: 65 - 99 mg/dL 61 (L) 144 (H) 213 (H) 241 (H) 145 (H) 60 (L) 116 (H)   Review of Glycemic Control  Current orders for Inpatient glycemic control: Lantus 30 units QHS, Novolog 0-9 units TID with meals  Inpatient Diabetes Program Recommendations: Insulin - Basal: Fasting glucose 61 mg/dl on 04/12/17 and 60 mg/dl today. Please consider decreasing Lantus to 28 units QHS. Insulin-Meal Coverage: If patient eats at least 50% of meals, please consider ordering Novolog 3 units TID with meals.  Thanks, Barnie Alderman, RN, MSN, CDE Diabetes Coordinator Inpatient Diabetes Program [phone] (Team Pager from 8am to 5pm)

## 2017-04-13 NOTE — Progress Notes (Signed)
Patient's blood sugar checked on arrival to Endo unit. Blood sugar is currently 116. Will continue to monitor.

## 2017-04-13 NOTE — Consult Note (Signed)
Urology Consult Note    Requesting Attending Physician:  Charolette Forward, MD Service Requesting Consult:  @[email or web address] @ Service Providing Consult: Urology  Consulting Attending: Dr. Kathie Rhodes  Assessment:  Patient is a 76 y.o. male being evaluated for malignancy of unknown origin. Based on CT scan findings of pelvic and retroperitoneal adenopathy and bladder wall thickening in the absence of abnormal findings on sigmoidoscopy, a cystoscopy is indicated to rule out urothelial cancer as a cause for disease.   Cystoscopy performed at bedside (see separate procedure note), normal. While passing the flexible cystoscope through the prostate, it felt fixed and appeared to be slightly enlarged.   Based on these findings, recommend PSA to evaluate for prostate cancer as source of malignancy. If normal, recommend IR biopsy of adenopathy to determine malignancy source.   Recommendations: 1. PSA 2. If PSA normal, please consult IR for image guided biopsy of adenopathy  3. Consider sending voided urine cytology if PSA normal.  4. Please give single dose of ciprofloxacin 500mg  for UTI ppx after procedure.  5. Expect dysuria and gross hematuria for 24 hours after procedure. If longer or severe sx, please send a urine culture and initiate treatment of UTI.    Thank you for this consult. Please contact the urology consult pager with any further questions/concerns.  Jonna Clark, MD Urology Surgical Resident  -----  Reason for Consult:  Pelvic and retroperitoneal adenopathy concerning for metastatic disease   Calvin Perez is seen in consultation for reasons noted above at the request of Charolette Forward, MD on the Cardiology service.   This is a 76 y.o. yo patient with a history of DM, CAD, HTN and HLD who is currently admitted due to severe hyponatremia. During workup as to the cause, a CT abdomen pelvis was obtained which demonstrated pelvic and retroperitoneal adenopathy with irregular wall  thickening of the left lateral bladder wall and adjacent soft tissue stranding. Rectal wall thickening was also noted. Low density lesions throughout the liver were also noted concerning for metastatic disease.   Due to imaging and unknown source of adenopathy, a sigmoidoscopy was performed by the GI service today which was normal. No abnormalities of the rectum were noted. Thus urology was consulted for evaluation of bladder source of malignancy.   The visit was conducted with a [place] interpreter with the patient and wife. Per wife, patient has never had gross hematuria. 15 year smoking history 25 years ago. No exposure to chemicals or carcinogenic agents through work. Denies dysuria, frequency.    Past Medical History: Past Medical History:  Diagnosis Date  . Arthritis   . Coronary artery disease   . Diabetes mellitus    insulin dependent  . Dyslipidemia   . Hypertension     Past Surgical History:  Past Surgical History:  Procedure Laterality Date  . CARDIAC CATHETERIZATION    . CORONARY ARTERY BYPASS GRAFT    . LEFT HEART CATHETERIZATION WITH CORONARY ANGIOGRAM N/A 09/15/2013   Procedure: LEFT HEART CATHETERIZATION WITH CORONARY ANGIOGRAM;  Surgeon: Clent Demark, MD;  Location: [facility] CATH [facility];  Service: Cardiovascular;  Laterality: N/A;  . open heart surgery  1998  . PERCUTANEOUS STENT INTERVENTION  09/15/2013   Procedure: PERCUTANEOUS STENT INTERVENTION;  Surgeon: Clent Demark, MD;  Location: [facility] CATH [facility];  Service: Cardiovascular;;    Medication: Current Facility-Administered Medications  Medication Dose Route Frequency Provider Last Rate Last Dose  . 0.9 %  sodium chloride infusion   Intravenous Continuous Dixie Dials, MD 30  mL/hr at 04/13/17 1336    . acetaminophen (TYLENOL) tablet 650 mg  650 mg Oral Q6H PRN Charolette Forward, MD   650 mg at 04/09/17 2142  . aspirin EC tablet 81 mg  81 mg Oral Daily Charolette Forward, MD   81 mg at 04/13/17 1046  . atorvastatin (LIPITOR)  tablet 40 mg  40 mg Oral q1800 Charolette Forward, MD   40 mg at 04/12/17 1739  . demeclocycline (DECLOMYCIN) tablet 150 mg  150 mg Oral Q12H Dixie Dials, MD   150 mg at 04/13/17 1330  . dextrose 5 %-0.9 % sodium chloride infusion   Intravenous Continuous Dixie Dials, MD 30 mL/hr at 04/13/17 1328    . dextrose 50 % solution 25 mL  25 mL Intravenous Once Arta Silence, MD      . enoxaparin (LOVENOX) injection 40 mg  40 mg Subcutaneous Daily Charolette Forward, MD   40 mg at 04/13/17 1045  . feeding supplement (GLUCERNA SHAKE) (GLUCERNA SHAKE) liquid 237 mL  237 mL Oral TID BM Charolette Forward, MD   237 mL at 04/13/17 1328  . insulin aspart (novoLOG) injection 0-9 Units  0-9 Units Subcutaneous TID WC Charolette Forward, MD   2 Units at 04/13/17 1330  . insulin glargine (LANTUS) injection 30 Units  30 Units Subcutaneous QHS Charolette Forward, MD   30 Units at 04/12/17 2222  . isosorbide mononitrate (IMDUR) 24 hr tablet 30 mg  30 mg Oral Daily Charolette Forward, MD   30 mg at 04/13/17 1046  . losartan (COZAAR) tablet 50 mg  50 mg Oral Daily Dixie Dials, MD   50 mg at 04/13/17 1046  . metoprolol tartrate (LOPRESSOR) tablet 25 mg  25 mg Oral BID Charolette Forward, MD   25 mg at 04/13/17 1046  . nitroGLYCERIN (NITROSTAT) SL tablet 0.4 mg  0.4 mg Sublingual Q5 Min x 3 PRN Charolette Forward, MD      . RESOURCE THICKENUP CLEAR   Oral PRN Hammons, Theone Murdoch, RPH        Allergies: No Known Allergies  Social History: Social History  Substance Use Topics  . Smoking status: Never Smoker  . Smokeless tobacco: Never Used  . Alcohol use No    Family History History reviewed. No pertinent family history.  Review of Systems 10 systems were reviewed and are negative except as noted specifically in the HPI.  Objective   Vital signs in last 24 hours: BP (!) 152/57 (BP Location: Left Arm)   Pulse 77   Temp 98.1 F (36.7 C) (Oral)   Resp 17   Ht 5\' 3"  (1.6 m)   Wt 75.9 kg (167 lb 4.8 oz)   SpO2 97%   BMI 29.64  kg/m   Intake/Output last 3 shifts: I/O last 3 completed shifts: In: 1542.3 [P.O.:460; I.V.:1082.3] Out: -   Physical Exam General: NAD, A&O, resting, appropriate HEENT: /AT, EOMI, MMM Pulmonary: Normal work of breathing on room air  Cardiovascular: HDS, adequate peripheral perfusion Abdomen: soft, NTTP, nondistended, no suprapubic fullness or tenderness GU: uncircumcised male phallus. Normal appears scrotum, bilateral descended testicles. No masses noted. Normal appearing glans.  Extremities: warm and well perfused, no edema Neuro: Appropriate, no focal neurological deficits  Most Recent Labs: Lab Results  Component Value Date   WBC 8.1 04/09/2017   HGB 8.9 (L) 04/09/2017   HCT 26.3 (L) 04/09/2017   PLT 369 04/09/2017    Lab Results  Component Value Date   NA 130 (L) 04/13/2017   K  4.3 04/11/2017   CL 106 04/11/2017   CO2 22 04/11/2017   BUN 10 04/11/2017   CREATININE 0.85 04/11/2017   CALCIUM 9.2 04/11/2017   MG 1.8 04/08/2017    Lab Results  Component Value Date   ALKPHOS 180 (H) 04/11/2017   BILITOT 0.7 04/11/2017   PROT 7.5 04/11/2017   ALBUMIN 3.0 (L) 04/11/2017   ALT 98 (H) 04/11/2017   AST 88 (H) 04/11/2017    Lab Results  Component Value Date   INR 1.10 04/07/2017   APTT 45 (H) 04/07/2017     UA:  Units 11:38 (04/13/17) 63yr ago (08/13/15) 69yr ago (01/28/14) 9yr ago (10/30/13) 49yr ago (06/19/13)     Color, Urine YELLOW YELLOW  YELLOWR  YELLOWR  YELLOWR  LT. YELLOWR    APPearance CLEAR CLEAR  CLEARR  CLEARR  CLEARR  CLEARR    Specific Gravity, Urine 1.005 - 1.030 1.015  1.010R  1.010R  1.020R  1.020R    pH 5.0 - 8.0 6.0  6.0  6.0  5.5  6.5    Glucose, UA NEGATIVE mg/dL NEGATIVE        Hgb urine dipstick NEGATIVE NEGATIVE  NEGATIVER  NEGATIVER  NEGATIVER  NEGATIVER    Bilirubin Urine NEGATIVE NEGATIVE  NEGATIVER  NEGATIVER  NEGATIVER  NEGATIVER    Ketones, ur NEGATIVE mg/dL NEGATIVE  NEGATIVER  NEGATIVER  NEGATIVER  NEGATIVER    Protein, ur  NEGATIVE mg/dL NEGATIVE        Nitrite NEGATIVE NEGATIVE  NEGATIVER  NEGATIVER  NEGATIVER  NEGATIVER    Leukocytes, UA NEGATIVE NEGATIVE  NEGATIVER  NEGATIVER  NEGATIVER  NEGATIVER    Total Protein, Urine   NEGATIVE  NEGATIVE  NEGATIVE  NEGATIVE    Urine Glucose   NEGATIVE  250   NEGATIVE  100    Urobilinogen, UA   0.2  0.2  0.2  0.2    WBC, UA   0-2/hpf  none seen  0-2/hpf     RBC / HPF   0-2/hpf  none seen      Squamous Epithelial / LPF          IMAGING: No results found. CT abdomen pelvis with contrast from 7/10 reviewed:  IMPRESSION: 1. Pelvic and retroperitoneal adenopathy highly suspicious for metastatic disease. Suspect primary being left aspect of the urinary bladder where there is irregular wall thickening and adjacent soft tissue stranding. Alternatively, suggestion of irregular anterior rectal wall thickening could be primary rectal malignancy. Recommend direct visualization of both of these regions with cystoscopy and sigmoidoscopy/colonoscopy. 2. There is prominence of both renal collecting systems without Jeremih hydronephrosis. 3. Low-density lesions scattered throughout the liver, nonspecific. These may reflect hemangiomas, however in the setting of possible malignancy, metastasis rule out recommended. PET-CT versus hepatic protocol MRI based on direct visualization results. 4. Advanced aortic atherosclerosis. 5. Left external iliac adenopathy may cause mass-effect on the left external iliac vein which is not well-defined, consider lower extremity duplex for evaluation of DVT. These results will be called to the ordering clinician or representative by the Radiologist Assistant, and communication documented in the PACS or zVision Dashboard.   Patient was seen, examined,treatment plan was discussed with the resident.  I have directly reviewed the clinical findings, lab, imaging studies and management of this patient in detail. I have made the necessary changes and/or  additions to the above noted documentation, and agree with the documentation, as recorded by the resident.

## 2017-04-13 NOTE — Evaluation (Signed)
Clinical/Bedside Swallow Evaluation Patient Details  Name: Calvin Perez MRN: [id number] Date of Birth: 1940/10/21  Today's Date: 04/13/2017 Time: SLP Start Time (ACUTE ONLY): 62 SLP Stop Time (ACUTE ONLY): 1620 SLP Time Calculation (min) (ACUTE ONLY): 30 min  Past Medical History:  Past Medical History:  Diagnosis Date  . Arthritis   . Coronary artery disease   . Diabetes mellitus    insulin dependent  . Dyslipidemia   . Hypertension    Past Surgical History:  Past Surgical History:  Procedure Laterality Date  . CARDIAC CATHETERIZATION    . CORONARY ARTERY BYPASS GRAFT    . LEFT HEART CATHETERIZATION WITH CORONARY ANGIOGRAM N/A 09/15/2013   Procedure: LEFT HEART CATHETERIZATION WITH CORONARY ANGIOGRAM;  Surgeon: Clent Demark, MD;  Location: [facility] CATH LAB;  Service: Cardiovascular;  Laterality: N/A;  . open heart surgery  1998  . PERCUTANEOUS STENT INTERVENTION  09/15/2013   Procedure: PERCUTANEOUS STENT INTERVENTION;  Surgeon: Clent Demark, MD;  Location: [facility] CATH LAB;  Service: Cardiovascular;;   HPI:  Calvin Perez a 76 y.o.maleadmitted with confusion, hyponatremia. Found to have anemia and CT showed lesion in rectum +/- bladder with adenopathy worrisome for locally expansive malignancy. Pt has history of CVA with resultant dysarthria, dysphagia, right sided weakness in 2014, as well as pontine infarct in 2013 per records. Seen for outpatient MBS 04/2013 with findings of a moderate sensory motor oropharyngeal dysphagia with silent aspiration of thin liquids. 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 Pt was recommended to consume nectar thick liquids with regular solids with moderate aspiration risk. SLP recommended f/u in OP for dysphagia and dysarthria, as well as ENT referral to evaluate vocal cord function. CT 04/07/17 reveals no acute infarction, CXR 04/10/17 revealed right basilar scarring without acute infiltrate.    Assessment / Plan / Recommendation Clinical Impression  Patient presents with moderate risk for aspiration given history of CVA with resultant dysphagia (recommendations for nectar-thick liquids - 2014), current mentation (difficult to assess given language barrier however daughter reports confusion). Oral mechanism examination notable for weak cough, low vocal amplitude. Pt consumed single ice chip with no overt signs of aspiration, however PO trials limited at MD request as pt preparing to enter procedure. Spoke with pt's daughter at length via phone, who states pt did intially consume thickened liquids s/p MBS in 2014, but states he has been consuming a soft diet with thin liquids for several years and has not had pneumonia. She thinks pt did have some ST in home health for swallowing but she is unable to recall for certain (no records available). Wife states he coughs sometimes when drinking water, but feels this problem has improved. Provided education to patient, his wife and daughter re: MBS which showed silent aspiration of thin liquids. Given history of silent aspiration, recommend pt repeat instrumental examination to determine whether he has made improvements in swallowing function. Patient has not had pneumonia and most recent chest xray shows no acute infiltrate; recommend patient continue current diet pending MBS, however pt remains at moderate risk for aspiration. MBS to be performed next date; patient and family in agreement with this plan. SLP Visit Diagnosis: Dysphagia, pharyngeal phase (R13.13)    Aspiration Risk  Moderate aspiration risk    Diet Recommendation Other (Comment) (continue current diet pending instrumental assessment)        Other  Recommendations Oral Care Recommendations: Oral care BID   Follow up Recommendations Other (comment) (  TBD)      Frequency and Duration min 2x/week  2 weeks       Prognosis Prognosis for Safe Diet Advancement: Fair Barriers to  Reach Goals: Cognitive deficits;Time post onset      Swallow Study   General Date of Onset: 04/07/17 HPI: Calvin Perez a 76 y.o.maleadmitted with confusion, hyponatremia. Found to have anemia and CT showed lesion in rectum +/- bladder with adenopathy worrisome for locally expansive malignancy. Pt has history of CVA with resultant dysarthria, dysphagia, right sided weakness in 2014, as well as pontine infarct in 2013 per records. Seen for outpatient MBS 04/2013 with findings of a moderate sensory motor oropharyngeal dysphagia with silent aspiration of thin liquids. 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 Pt was recommended to consume nectar thick liquids with regular solids with moderate aspiration risk. SLP recommended f/u in OP for dysphagia and dysarthria, as well as ENT referral to evaluate vocal cord function. CT 04/07/17 reveals no acute infarction, CXR 04/10/17 revealed right basilar scarring without acute infiltrate. Type of Study: Bedside Swallow Evaluation Previous Swallow Assessment: see HPI Diet Prior to this Study: Dysphagia 3 (soft);Thin liquids Temperature Spikes Noted: No Respiratory Status: Room air History of Recent Intubation: No Behavior/Cognition: Alert;Cooperative Oral Cavity Assessment: Within Functional Limits Oral Care Completed by SLP: No Oral Cavity - Dentition: Missing dentition Vision: Functional for self-feeding Self-Feeding Abilities: Able to feed self Patient Positioning: Upright in bed Baseline Vocal Quality: Low vocal intensity;Other (comment) Volitional Cough: Weak Volitional Swallow: Able to elicit    Oral/Motor/Sensory Function Overall Oral Motor/Sensory Function: Within functional limits   Ice Chips Ice chips: Within functional limits   Thin Liquid Thin Liquid: Not tested    Nectar Thick Nectar Thick Liquid: Not tested   Honey Thick Honey Thick Liquid: Not tested   Puree Puree: Not tested    Solid   GO   Calvin Perez, [place], CCC-SLP Speech-Language Pathologist [phone] Solid: Not tested        Calvin Perez 04/13/2017,5:04 PM

## 2017-04-13 NOTE — Interval H&P Note (Signed)
History and Physical Interval Note:  04/13/2017 8:02 AM  Calvin Perez  has presented today for surgery, with the diagnosis of anemia, rectal mass on CT  The various methods of treatment have been discussed with the patient and family. After consideration of risks, benefits and other options for treatment, the patient has consented to  Procedure(s): FLEXIBLE SIGMOIDOSCOPY (Left) as a surgical intervention .  The patient's history has been reviewed, patient examined, no change in status, stable for surgery.  I have reviewed the patient's chart and labs.  Questions were answered to the patient's satisfaction.     Landry Dyke

## 2017-04-13 NOTE — Progress Notes (Signed)
  Echocardiogram 2D Echocardiogram has been performed.  Calvin Perez 04/13/2017, 2:55 PM

## 2017-04-13 NOTE — Progress Notes (Signed)
Ref: Dixie Dials, MD   Subjective:  Tolerated sigmoidoscopy in the morning and cystourethroscopy in the evening. Wife at bedside aware of normal findings of both procedures. I also communicated with the daughter by phone.  Objective:  Vital Signs in the last 24 hours: Temp:  [98.1 F (36.7 C)-98.7 F (37.1 C)] 98.1 F (36.7 C) (07/13 1410) Pulse Rate:  [63-85] 77 (07/13 1410) Cardiac Rhythm: Heart block (07/13 1900) Resp:  [13-18] 17 (07/13 1410) BP: (116-171)/(52-68) 152/57 (07/13 1410) SpO2:  [96 %-100 %] 97 % (07/13 1410)  Physical Exam: BP Readings from Last 1 Encounters:  04/13/17 (!) 152/57    Wt Readings from Last 1 Encounters:  04/12/17 75.9 kg (167 lb 4.8 oz)    Weight change:  Body mass index is 29.64 kg/m. HEENT: [place]/AT, Eyes-Brown, PERL, EOMI, Conjunctiva-Pale pink, Sclera-Non-icteric Neck: No JVD, No bruit, Trachea midline. Lungs:  Clear, Bilateral. Cardiac:  Regular rhythm, normal S1 and S2, no S3. II/VI systolic murmur. Abdomen:  Soft, non-tender. BS present. Extremities:  No edema present. No cyanosis. No clubbing. CNS: AxOx2, Cranial nerves grossly intact, moves all 4 extremities.  Skin: Warm and dry.   Intake/Output from previous day: 07/12 0701 - 07/13 0700 In: 842.3 [P.O.:240; I.V.:602.3] Out: -     Lab Results: BMET    Component Value Date/Time   NA 130 (L) 04/13/2017 0455   NA 132 (L) 04/12/2017 0351   NA 136 04/11/2017 0730   K 4.3 04/11/2017 0730   K 4.5 04/10/2017 0438   K 4.7 04/09/2017 0343   CL 106 04/11/2017 0730   CL 89 (L) 04/10/2017 0438   CL 88 (L) 04/09/2017 0343   CO2 22 04/11/2017 0730   CO2 22 04/10/2017 0438   CO2 24 04/09/2017 0343   GLUCOSE 116 (H) 04/11/2017 0730   GLUCOSE 116 (H) 04/10/2017 0438   GLUCOSE 181 (H) 04/09/2017 0343   BUN 10 04/11/2017 0730   BUN 9 04/10/2017 0438   BUN 7 04/09/2017 0343   CREATININE 0.85 04/11/2017 0730   CREATININE 0.83 04/10/2017 0438   CREATININE 0.71 04/09/2017 0343    CALCIUM 9.2 04/11/2017 0730   CALCIUM 8.2 (L) 04/10/2017 0438   CALCIUM 8.3 (L) 04/09/2017 0343   GFRNONAA >60 04/11/2017 0730   GFRNONAA >60 04/10/2017 0438   GFRNONAA >60 04/09/2017 0343   GFRAA >60 04/11/2017 0730   GFRAA >60 04/10/2017 0438   GFRAA >60 04/09/2017 0343   CBC    Component Value Date/Time   WBC 8.1 04/09/2017 0343   RBC 3.35 (L) 04/09/2017 0343   HGB 8.9 (L) 04/09/2017 0343   HCT 26.3 (L) 04/09/2017 0343   PLT 369 04/09/2017 0343   MCV 78.5 04/09/2017 0343   MCH 26.6 04/09/2017 0343   MCHC 33.8 04/09/2017 0343   RDW 15.1 04/09/2017 0343   LYMPHSABS 1.2 04/07/2017 1732   MONOABS 0.9 04/07/2017 1732   EOSABS 0.1 04/07/2017 1732   BASOSABS 0.0 04/07/2017 1732   HEPATIC Function Panel  Recent Labs  04/07/17 1732 04/08/17 0000 04/11/17 0730  PROT 7.3 7.0 7.5   HEMOGLOBIN A1C No components found for: HGA1C,  MPG CARDIAC ENZYMES Lab Results  Component Value Date   TROPONINI 4.14 ([place]) 09/17/2013   BNP No results for input(s): PROBNP in the last 8760 hours. TSH  Recent Labs  04/08/17 0124  TSH 1.020   CHOLESTEROL  Recent Labs  08/31/16 1012 01/10/17 0849 04/08/17 0124  CHOL 250* 114 102    Scheduled Meds: .  aspirin EC  81 mg Oral Daily  . atorvastatin  40 mg Oral q1800  . demeclocycline  150 mg Oral Q12H  . dextrose  25 mL Intravenous Once  . enoxaparin (LOVENOX) injection  40 mg Subcutaneous Daily  . feeding supplement (GLUCERNA SHAKE)  237 mL Oral TID BM  . insulin aspart  0-9 Units Subcutaneous TID WC  . insulin glargine  30 Units Subcutaneous QHS  . isosorbide mononitrate  30 mg Oral Daily  . losartan  50 mg Oral Daily  . metoprolol tartrate  25 mg Oral BID   Continuous Infusions: . sodium chloride 30 mL/hr at 04/13/17 1336  . 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 Start demeclocycline for hyponatremia of SIADH.   LOS: 6 days    Dixie Dials  MD  04/13/2017, 7:43 PM

## 2017-04-13 NOTE — Procedures (Signed)
Urology procedure note  Indications: see urology consult note from same day, rule out urothelial cancer as source of likely metastatic disease   Pre-procedure diagnosis: Bladder wall thickening  Post-procedure diagnosis: Normal appearing bladder   Surgeon: Jonna Clark, MD  Assistants: None  Procedure Details:  Informed consent was obtained using an interpreter. Patient was placed in the supine prosition, prepped with betadine and draped in the usual sterile fashion. Lidocaine jelly was inserted per urethra prior to the procedure. I then inserted a flexible cystoscopy per urethra into the bladder. Urethroscopy was normal. The prostate felt fixed as I passed the cystoscope through the prostate. It appeared slightly enlarged, but did not appear grossly abnormal. Cystoscopy was normal. Normal appearing mucosa without masses, lesions, or areas of hypervascularity. Particularly, the left lateral wall appeared normal without concern for erythema or hypervascularity. All instrumentation was removed.   Complication: None, patient tolerated the procedure well   Attending Attestation: Dr. Karsten Ro was available.   ---- Jonna Clark, MD Urologic Surgery Resident

## 2017-04-13 NOTE — Op Note (Signed)
[facility] Patient Name: Calvin Perez Procedure Date : 04/13/2017 MRN: [id number] Attending MD: Arta Silence , MD Date of Birth: [birth year] CSN: [id number] Age: 76 Admit Type: Outpatient Procedure:                Flexible Sigmoidoscopy Indications:              Abnormal CT of the GI tract, Anemia Providers:                Arta Silence, MD, Cleda Daub, RN, Marcene Duos, Technician Referring MD:              Medicines:                Fentanyl 25 micrograms IV, Midazolam 1 mg IV Complications:            No immediate complications. Estimated Blood Loss:     Estimated blood loss: none. Procedure:                Pre-Anesthesia Assessment:                           - Prior to the procedure, a History and Physical                            was performed, and patient medications and                            allergies were reviewed. The patient's tolerance of                            previous anesthesia was also reviewed. The risks                            and benefits of the procedure and the sedation                            options and risks were discussed with the patient.                            All questions were answered, and informed consent                            was obtained. Prior Anticoagulants: The patient has                            taken ticagrelor, last dose was 2 days prior to                            procedure. ASA Grade Assessment: III - A patient                            with severe systemic disease. After reviewing the  risks and benefits, the patient was deemed in                            satisfactory condition to undergo the procedure.                           After obtaining informed consent, the scope was                            passed under direct vision. The [id number] ([id number])                            scope was introduced through the anus and advanced                     to the the descending colon. The flexible                            sigmoidoscopy was accomplished without difficulty.                            The patient tolerated the procedure well. The                            quality of the bowel preparation was good. Scope In: Scope Out: Findings:      The perianal exam findings include hard, fixed irregular fullness along       anterior rectal wall.      1-2 cm bulge in distal anterior rectum, likely extrinsic compression       from lesion seen on CT scan. Overlying rectal mucosa was normal.      Internal hemorrhoids were found during retroflexion. The hemorrhoids       were moderate.      The exam was otherwise without abnormality. Impression:               - Hard, fixed irregular fullness along anterior                            rectal wall. found on rectal exam. Endoscopically,                            normal overlying rectal mucosa seen but with                            evidence of extrinsic compression in distal                            anterior rectum, likely from lesion seen on CT                            scan, which appears to be originating from the                            bladder.                           -  Internal hemorrhoids.                           - The examination was otherwise normal. Moderate Sedation:      Moderate (conscious) sedation was administered by the endoscopy nurse       and supervised by the endoscopist. The following parameters were       monitored: oxygen saturation, heart rate, blood pressure, and response       to care. Recommendation:           - Return patient to hospital ward for ongoing care.                           - Advance diet as tolerated today.                           - Continue present medications.                           - Needs cystoscopy for further evaluation.                           - [facility] will sign-off; no further GI testing                             anticipated. Procedure Code(s):        --- Professional ---                           [phone], Sigmoidoscopy, flexible; diagnostic,                            including collection of specimen(s) by brushing or                            washing, when performed (separate procedure) Diagnosis Code(s):        --- Professional ---                           K64.8, Other hemorrhoids                           D64.9, Anemia, unspecified                           R93.3, Abnormal findings on diagnostic imaging of                            other parts of digestive tract CPT copyright 2016 [employer]. All rights reserved. The codes documented in this report are preliminary and upon coder review may  be revised to meet current compliance requirements. Arta Silence, MD 04/13/2017 8:34:18 AM This report has been signed electronically. Number of Addenda: 0

## 2017-04-13 NOTE — H&P (View-Only) (Signed)
[facility] Consultation Note  Referring Provider: Dr. Doylene Canard Primary Care Physician:  Dixie Dials, MD  Reason for Consultation:  Abnormal CT scan  HPI: Calvin Perez is a 76 y.o. male admitted with confusion, hyponatremia.  Found to have anemia and CT showed lesion in rectum +/- bladder with adenopathy worrisome for locally expansive malignancy.  Through discussion with daughter, [name], as interpreter, patient denies abdominal pain or change in bowel habits or blood in stool.  Has had some weight loss.  Is on ticagrelor for stroke few years ago.   Past Medical History:  Diagnosis Date  . Arthritis   . Coronary artery disease   . Diabetes mellitus    insulin dependent  . Dyslipidemia   . Hypertension     Past Surgical History:  Procedure Laterality Date  . CARDIAC CATHETERIZATION    . CORONARY ARTERY BYPASS GRAFT    . LEFT HEART CATHETERIZATION WITH CORONARY ANGIOGRAM N/A 09/15/2013   Procedure: LEFT HEART CATHETERIZATION WITH CORONARY ANGIOGRAM;  Surgeon: Clent Demark, MD;  Location: [place] CATH LAB;  Service: Cardiovascular;  Laterality: N/A;  . open heart surgery  1998  . PERCUTANEOUS STENT INTERVENTION  09/15/2013   Procedure: PERCUTANEOUS STENT INTERVENTION;  Surgeon: Clent Demark, MD;  Location: [place] CATH [facility];  Service: Cardiovascular;;    Prior to Admission medications   Medication Sig Start Date End Date Taking? Authorizing Provider  aspirin 81 MG tablet Take 81 mg by mouth daily.   Yes [provider]  atorvastatin (LIPITOR) 80 MG tablet Take 1 tablet (80 mg total) by mouth daily. 11/05/14  Yes Elayne Snare, MD  colesevelam [facility]) 625 MG tablet Take 3 tablets twice a day Patient taking differently: Take 1,875 mg by mouth 2 (two) times daily with a meal.  12/15/13  Yes Elayne Snare, MD  furosemide (LASIX) 40 MG tablet Take 40 mg by mouth daily.   Yes [provider]  gabapentin (NEURONTIN) 300 MG capsule Take 1 capsule (300 mg total) by  mouth 3 (three) times daily. Patient taking differently: Take 100 mg by mouth 3 (three) times daily.  09/07/16  Yes Elayne Snare, MD  insulin NPH Human (NOVOLIN N) 100 UNIT/ML injection In AM 25 units and bedtime 25 units. Patient taking differently: Inject 10-45 Units into the skin See admin instructions. Inject 45 units SQ in the morning and inject 10 units SQ at bedtime 07/21/16  Yes Elayne Snare, MD  isosorbide mononitrate (IMDUR) 30 MG 24 hr tablet Take 1 tablet (30 mg total) by mouth daily. 09/18/13  Yes Dixie Dials, MD  KLOR-CON 10 10 MEQ tablet Take 10 mEq by mouth daily. 10/29/14  Yes [provider]  metoprolol tartrate (LOPRESSOR) 12.5 mg TABS tablet Take 0.5 tablets (12.5 mg total) by mouth 2 (two) times daily. 09/18/13  Yes Dixie Dials, MD  valsartan (DIOVAN) 160 MG tablet TAKE 1 TABLET (160 MG TOTAL) BY MOUTH DAILY. 04/02/17  Yes Elayne Snare, MD  ACCU-CHEK AVIVA PLUS test strip USE AS DIRECTED 3 TIMES A DAY Patient not taking: Reported on 04/09/2017 04/10/16   Elayne Snare, MD  insulin aspart (NOVOLOG) 100 UNIT/ML injection Inject 15-30-25 with meals, Patient taking differently: Inject 10-30 Units into the skin See admin instructions. Inject 10 units SQ at breakfast, 30 units SQ at lunch and 25 units SQ at dinner 02/11/16   Elayne Snare, MD  insulin lispro (HUMALOG) 100 UNIT/ML injection Take before breakfast 10 units, lunch time 25 and dinner 30 units  Patient not  taking: Reported on 04/09/2017 09/07/16   Elayne Snare, MD  insulin NPH Human (NOVOLIN N RELION) 100 UNIT/ML injection morning 35 units and bedtime 25 units Patient not taking: Reported on 04/09/2017 09/07/16   Elayne Snare, MD  insulin regular (NOVOLIN R) 100 units/mL injection 10 units at breakfast, 25 units at lunch, 25 units at dinner. 15 minutes before meals. Patient not taking: Reported on 04/09/2017 07/21/16   Elayne Snare, MD  metFORMIN (GLUCOPHAGE-XR) 500 MG 24 hr tablet TAKE 3 TABLETS BY MOUTH ONCE DAILY WITH  SUPPER Patient not taking: Reported on 04/09/2017 02/28/17   Elayne Snare, MD  nitroGLYCERIN (NITROSTAT) 0.4 MG SL tablet Place 1 tablet (0.4 mg total) under the tongue every 5 (five) minutes x 3 doses as needed for chest pain. 09/18/13   Dixie Dials, MD  Ticagrelor (BRILINTA) 90 MG TABS tablet Take 1 tablet (90 mg total) by mouth 2 (two) times daily. Patient not taking: Reported on 04/09/2017 09/18/13   Dixie Dials, MD    Current Facility-Administered Medications  Medication Dose Route Frequency Provider Last Rate Last Dose  . 0.9 %  sodium chloride infusion   Intravenous Continuous Dixie Dials, MD 30 mL/hr at 04/12/17 1018    . acetaminophen (TYLENOL) tablet 650 mg  650 mg Oral Q6H PRN Charolette Forward, MD   650 mg at 04/09/17 2142  . aspirin EC tablet 81 mg  81 mg Oral Daily Charolette Forward, MD   81 mg at 04/12/17 1019  . atorvastatin (LIPITOR) tablet 40 mg  40 mg Oral q1800 Charolette Forward, MD   40 mg at 04/11/17 1732  . dextrose 5 %-0.9 % sodium chloride infusion   Intravenous Continuous Dixie Dials, MD   Stopped at 04/11/17 [phone]  . enoxaparin (LOVENOX) injection 40 mg  40 mg Subcutaneous Daily Charolette Forward, MD   40 mg at 04/12/17 1018  . feeding supplement (GLUCERNA SHAKE) (GLUCERNA SHAKE) liquid 237 mL  237 mL Oral TID BM Harwani, Mohan, MD      . insulin aspart (novoLOG) injection 0-9 Units  0-9 Units Subcutaneous TID WC Charolette Forward, MD   3 Units at 04/12/17 1220  . insulin glargine (LANTUS) injection 30 Units  30 Units Subcutaneous QHS Charolette Forward, MD   30 Units at 04/11/17 2158  . isosorbide mononitrate (IMDUR) 24 hr tablet 30 mg  30 mg Oral Daily Charolette Forward, MD   30 mg at 04/12/17 1019  . losartan (COZAAR) tablet 50 mg  50 mg Oral Daily Dixie Dials, MD   50 mg at 04/12/17 1018  . metoprolol tartrate (LOPRESSOR) tablet 25 mg  25 mg Oral BID Charolette Forward, MD   25 mg at 04/12/17 1018  . nitroGLYCERIN (NITROSTAT) SL tablet 0.4 mg  0.4 mg Sublingual Q5 Min x 3 PRN Charolette Forward, MD        Allergies as of 04/07/2017  . (No Known Allergies)    History reviewed. No pertinent family history.  Social History   Social History  . Marital status: Married    Spouse name: sumitra  . Number of children: 2  . Years of education: 12   Occupational History  . retired    Social History Main Topics  . Smoking status: Never Smoker  . Smokeless tobacco: Never Used  . Alcohol use No  . Drug use: No  . Sexual activity: Not Currently   Other Topics Concern  . Not on file   Social History Narrative  . No narrative on file  Review of Systems: As per HPI, all others negative  Physical Exam: Vital signs in last 24 hours: Temp:  [97.5 F (36.4 C)-97.7 F (36.5 C)] 97.5 F (36.4 C) (07/12 0550) Pulse Rate:  [67-77] 67 (07/12 0550) Resp:  [18] 18 (07/12 0550) BP: (148-153)/(65-70) 153/67 (07/12 0550) SpO2:  [94 %-100 %] 100 % (07/12 0550) Weight:  [75.9 kg (167 lb 4.8 oz)] 75.9 kg (167 lb 4.8 oz) (07/12 0550) Last BM Date: 04/11/17 General:   Alert,  Well-developed, well-nourished, pleasant and cooperative in NAD Abdomen:  Soft, nontender and nondistended. No masses, hepatosplenomegaly or hernias noted. Normal bowel sounds, without guarding, and without rebound.     Msk:  Symmetrical without gross deformities. Normal posture. Pulses:  Normal pulses noted. Extremities:  Without clubbing or edema. Neurologic:  Alert and  oriented x4; right hemiparesis; otherwise grossly normal neurologically. Skin:  Intact without significant lesions or rashes. Psych:  Alert and cooperative. Normal mood and affect.   Lab Results: No results for input(s): WBC, HGB, HCT, PLT in the last 72 hours. BMET  Recent Labs  04/10/17 0438 04/11/17 0730 04/12/17 0351  NA 116* 136 132*  K 4.5 4.3  --   CL 89* 106  --   CO2 22 22  --   GLUCOSE 116* 116*  --   BUN 9 10  --   CREATININE 0.83 0.85  --   CALCIUM 8.2* 9.2  --    LFT  Recent Labs  04/11/17 0730  PROT  7.5  ALBUMIN 3.0*  AST 88*  ALT 98*  ALKPHOS 180*  BILITOT 0.7   PT/INR No results for input(s): LABPROT, INR in the last 72 hours.  Studies/Results: Ct Abdomen Pelvis W Contrast  Result Date: 04/10/2017 CLINICAL DATA:  Anemia and decreased appetite. EXAM: CT ABDOMEN AND PELVIS WITH CONTRAST TECHNIQUE: Multidetector CT imaging of the abdomen and pelvis was performed using the standard protocol following bolus administration of intravenous contrast. CONTRAST:  177mL ISOVUE-300 IOPAMIDOL (ISOVUE-300) INJECTION 61% COMPARISON:  None. FINDINGS: Lower chest: Coronary artery calcifications, patient is post CABG. Scattered linear atelectasis in the lung bases. Hepatobiliary: Patchy hepatic enhancement with multiple low-density lesions scattered throughout the liver. Lesions are ill-defined, largest measuring 14 mm. Suggestion of peripheral enhancement of some of these lesions which suggests hemangiomas, however incompletely characterized. Gallbladder physiologically distended, no calcified stone. No biliary dilatation. Pancreas: Parenchymal atrophy. No ductal dilatation or inflammation. Spleen: Normal in size without focal abnormality. Small splenule anteriorly. Adrenals/Urinary Tract: No adrenal nodule. There is no perinephric edema, there is symmetric prominence of both renal collecting systems. Both ureters are prominent to the bladder insertion. No urolithiasis. There is asymmetric bladder wall thickening involving the left urinary bladder up to 6 mm, with moderate surrounding soft tissue stranding. Sigmoid colon abuts the bladder dome, however are fat plane is preserved and stranding appears centered on the bladder rather than the colon. Stomach/Bowel: Stomach physiologically distended. Small duodenum diverticulum. No small bowel inflammation, obstruction or wall thickening. Enteric contrast reaches the cecum. The appendix is air-filled without periappendiceal inflammation. Small to moderate colonic stool  burden. Sigmoid colon abuts the left aspect of the urinary bladder, however fat plane is preserved. Question of irregular wall thickening of the rectum anteriorly. Vascular/Lymphatic: Arterial vascular calcifications. Pelvic adenopathy, greatest in the left iliac stations. Enlarged left external iliac node measures 3.8 x 1.6 cm image 80 series 3. External iliac adenopathy within 1.4 cm node image 74 series 3. Multiple additional prominent left common and external iliac  nodes are seen. Left common iliac vein is not well-defined. Left retroperitoneal node at the iliac bifurcation measures 10 mm image 54 series 3. There is a 12 mm aortocaval node image 44. Additional smaller retroperitoneal nodes are seen. Enlarged right external iliac nodes, for example measuring 10 mm short axis image 74. Reproductive: Prostate gland appears heterogeneous but normal in size. Other: No ascites. No free air. No intra-abdominal abscess. Soft tissue densities and air in the anterior abdominal wall consistent with subcutaneous injections. Musculoskeletal: No lytic or blastic osseous lesions. IMPRESSION: 1. Pelvic and retroperitoneal adenopathy highly suspicious for metastatic disease. Suspect primary being left aspect of the urinary bladder where there is irregular wall thickening and adjacent soft tissue stranding. Alternatively, suggestion of irregular anterior rectal wall thickening could be primary rectal malignancy. Recommend direct visualization of both of these regions with cystoscopy and sigmoidoscopy/colonoscopy. 2. There is prominence of both renal collecting systems without D'Arcy hydronephrosis. 3. Low-density lesions scattered throughout the liver, nonspecific. These may reflect hemangiomas, however in the setting of possible malignancy, metastasis rule out recommended. PET-CT versus hepatic protocol MRI based on direct visualization results. 4. Advanced aortic atherosclerosis. 5. Left external iliac adenopathy may cause  mass-effect on the left external iliac vein which is not well-defined, consider lower extremity duplex for evaluation of DVT. These results will be called to the ordering clinician or representative by the Radiologist Assistant, and communication documented in the PACS or zVision Dashboard. Electronically Signed   By: Jeb Levering M.D.   On: 04/10/2017 19:53   Impression:  1.  Weakness, anemia. 2.  Weight loss. 3.  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 4.  Stroke couple years ago, on ticagrelor. 5.  Hyponatremia, much improved, now sodium 132.  Plan:  1.  Holding ticagrelor. 2.  Sonal, patient's daughter, served as Astronomer.  I discussed doing sigmoidoscopy with biopsies (will have been off ticagrelor 2 days by tomorrow's procedure), and Sonal agrees.  I don't think patient, given his stroke, could tolerate bowel preparation for complete colonoscopy. 3.  Next step in management pending sigmoidoscopy findings. 4.  Risks (bleeding, infection, bowel perforation that could require surgery, sedation-related changes in cardiopulmonary systems), benefits (identification and possible treatment of source of symptoms, exclusion of certain causes of symptoms), and alternatives (watchful waiting, radiographic imaging studies, empiric medical treatment) of sigmoidoscopy were explained to patient/family in detail and patient wishes to proceed. 5.  [facility] will follow.   LOS: 5 days   Leyani Gargus M  04/12/2017, 2:10 PM  Pager [phone] If no answer or after 5 PM call [phone]

## 2017-04-13 NOTE — Progress Notes (Signed)
Patients cbg 60 when transport arrived for endo, dextrose given and Kathlee Nations RN notified via phone and will recheck cbg

## 2017-04-13 NOTE — Progress Notes (Signed)
Physical Therapy Treatment Patient Details Name: MIR FULLILOVE MRN: [id number] DOB: 02/22/1941 Today's Date: 04/13/2017    History of Present Illness Patient is a 76 y/o male admitted with weakness, fatigue and cramps.  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  PMH positive for CABG 2006, cardiac stenting, DM, HTN.    PT Comments    Interpreter used - [id number]. Progressing toward goals, requires max assist for bed mobility, but only min guard for safety during ambulation. Veered left consistently during gait and required verbal and tactile cues to realign. Interpreter used and helpful, but pt still very flat throughout tx and language barrier heavily affects tx and knowledge about cognition. Discharge plan remains appropriate.    Follow Up Recommendations  Home health PT     Equipment Recommendations  None recommended by PT    Recommendations for Other Services       Precautions / Restrictions Precautions Precautions: Fall Restrictions Weight Bearing Restrictions: No    Mobility  Bed Mobility Overal bed mobility: Needs Assistance Bed Mobility: Supine to Sit;Sit to Supine     Supine to sit: Max assist Sit to supine: Mod assist   General bed mobility comments: Needed max assist and bilateral HHA to lift trunk, min assist scooting EOB with increased time; to supine, mod assist for balance and assist to lift LE   Transfers Overall transfer level: Needs assistance Equipment used: Rolling walker (2 wheeled) Transfers: Sit to/from Stand Sit to Stand: Mod assist         General transfer comment: Pt needed whole bed elevated for sit to stand, needed bilateral support of RW to stand and max assist from therapist  Ambulation/Gait Ambulation/Gait assistance: Min guard Ambulation Distance (Feet): 100 Feet Assistive device: Rolling walker (2 wheeled) Gait Pattern/deviations: Decreased stride length;Step-through pattern     General Gait Details: VC and  tactile cues for direction and safety. Pt consistently veering to L during gait. Pt looking down 90% of gait and required vc to look up.   Stairs            Wheelchair Mobility    Modified Rankin (Stroke Patients Only)       Balance Overall balance assessment: Needs assistance Sitting-balance support: Feet supported;Bilateral upper extremity supported Sitting balance-Leahy Scale: Poor Sitting balance - Comments: Required bilateral extremity support at first to prevent posterior LOB. Postural control: Posterior lean;Left lateral lean Standing balance support: Bilateral upper extremity supported;Single extremity supported Standing balance-Leahy Scale: Poor Standing balance comment: min guard for safety                            Cognition Arousal/Alertness: Awake/alert Behavior During Therapy: Flat affect Overall Cognitive Status: Impaired/Different from baseline Area of Impairment: Memory;Following commands;Safety/judgement;Awareness;Problem solving                     Memory: Decreased short-term memory (Couldn't remember DOB) Following Commands: Follows one step commands inconsistently;Follows one step commands with increased time Safety/Judgement: Decreased awareness of safety;Decreased awareness of deficits   Problem Solving: Slow processing;Decreased initiation;Difficulty sequencing;Requires verbal cues;Requires tactile cues General Comments: Difficult to assess due to language barrier, and [facility]      Exercises      General Comments        Pertinent Vitals/Pain Pain Assessment: No/denies pain    Home Living  Prior Function            PT Goals (current goals can now be found in the care plan section) Progress towards PT goals: Progressing toward goals    Frequency    Min 3X/week      PT Plan Current plan remains appropriate    Co-evaluation              AM-PAC PT "6 Clicks" Daily Activity   Outcome Measure  Difficulty turning over in bed (including adjusting bedclothes, sheets and blankets)?: Total Difficulty moving from lying on back to sitting on the side of the bed? : Total Difficulty sitting down on and standing up from a chair with arms (e.g., wheelchair, bedside commode, etc,.)?: Total Help needed moving to and from a bed to chair (including a wheelchair)?: A Lot Help needed walking in hospital room?: A Little Help needed climbing 3-5 steps with a railing? : A Lot 6 Click Score: 10    End of Session Equipment Utilized During Treatment: Gait belt Activity Tolerance: Patient tolerated treatment well Patient left: in bed;with call bell/phone within reach;with bed alarm set;with family/visitor present   PT Visit Diagnosis: Other abnormalities of gait and mobility (R26.89);Muscle weakness (generalized) (M62.81)     Time: 4097-3532 PT Time Calculation (min) (ACUTE ONLY): 15 min  Charges:  $Gait Training: 8-22 mins                    G Codes:       Rosalyn Charters, SPTA Office [phone]   Rosalyn Charters 04/13/2017, 5:32 PM

## 2017-04-13 NOTE — Progress Notes (Signed)
Ref: Calvin Dials, MD  Late Entry: Subjective:  Had episode of hypoglycemia, somewhat confused. Mild hyponatremia continues. Undergoing GI evaluation for possible rectal mass.  Objective:  Vital Signs in the last 24 hours: Temp:  [98.1 F (36.7 C)-98.7 F (37.1 C)] 98.1 F (36.7 C) (07/13 1410) Pulse Rate:  [63-85] 77 (07/13 1410) Cardiac Rhythm: Heart block (07/13 1900) Resp:  [13-18] 17 (07/13 1410) BP: (116-171)/(52-68) 152/57 (07/13 1410) SpO2:  [96 %-100 %] 97 % (07/13 1410)  Physical Exam: BP Readings from Last 1 Encounters:  04/13/17 (!) 152/57    Wt Readings from Last 1 Encounters:  04/12/17 75.9 kg (167 lb 4.8 oz)    Weight change:  Body mass index is 29.64 kg/m. HEENT: [place]/AT, Eyes-Brown, PERL, EOMI, Conjunctiva-Pale pink, Sclera-Non-icteric Neck: No JVD, No bruit, Trachea midline. Lungs:  Clear, Bilateral. Cardiac:  Regular rhythm, normal S1 and S2, no S3. II/VI systolic murmur. Abdomen:  Soft, non-tender. BS present. Extremities:  No edema present. No cyanosis. No clubbing. CNS: AxOx2, Cranial nerves grossly intact, moves all 4 extremities. Right-sided weakness. Skin: Warm and dry.   Intake/Output from previous day: 07/12 0701 - 07/13 0700 In: 842.3 [P.O.:240; I.V.:602.3] Out: -     Lab Results: BMET    Component Value Date/Time   NA 130 (L) 04/13/2017 0455   NA 132 (L) 04/12/2017 0351   NA 136 04/11/2017 0730   K 4.3 04/11/2017 0730   K 4.5 04/10/2017 0438   K 4.7 04/09/2017 0343   CL 106 04/11/2017 0730   CL 89 (L) 04/10/2017 0438   CL 88 (L) 04/09/2017 0343   CO2 22 04/11/2017 0730   CO2 22 04/10/2017 0438   CO2 24 04/09/2017 0343   GLUCOSE 116 (H) 04/11/2017 0730   GLUCOSE 116 (H) 04/10/2017 0438   GLUCOSE 181 (H) 04/09/2017 0343   BUN 10 04/11/2017 0730   BUN 9 04/10/2017 0438   BUN 7 04/09/2017 0343   CREATININE 0.85 04/11/2017 0730   CREATININE 0.83 04/10/2017 0438   CREATININE 0.71 04/09/2017 0343   CALCIUM 9.2 04/11/2017 0730    CALCIUM 8.2 (L) 04/10/2017 0438   CALCIUM 8.3 (L) 04/09/2017 0343   GFRNONAA >60 04/11/2017 0730   GFRNONAA >60 04/10/2017 0438   GFRNONAA >60 04/09/2017 0343   GFRAA >60 04/11/2017 0730   GFRAA >60 04/10/2017 0438   GFRAA >60 04/09/2017 0343   CBC    Component Value Date/Time   WBC 8.1 04/09/2017 0343   RBC 3.35 (L) 04/09/2017 0343   HGB 8.9 (L) 04/09/2017 0343   HCT 26.3 (L) 04/09/2017 0343   PLT 369 04/09/2017 0343   MCV 78.5 04/09/2017 0343   MCH 26.6 04/09/2017 0343   MCHC 33.8 04/09/2017 0343   RDW 15.1 04/09/2017 0343   LYMPHSABS 1.2 04/07/2017 1732   MONOABS 0.9 04/07/2017 1732   EOSABS 0.1 04/07/2017 1732   BASOSABS 0.0 04/07/2017 1732   HEPATIC Function Panel  Recent Labs  04/07/17 1732 04/08/17 0000 04/11/17 0730  PROT 7.3 7.0 7.5   HEMOGLOBIN A1C No components found for: HGA1C,  MPG CARDIAC ENZYMES Lab Results  Component Value Date   TROPONINI 4.14 ([place]) 09/17/2013   BNP No results for input(s): PROBNP in the last 8760 hours. TSH  Recent Labs  04/08/17 0124  TSH 1.020   CHOLESTEROL  Recent Labs  08/31/16 1012 01/10/17 0849 04/08/17 0124  CHOL 250* 114 102    Scheduled Meds: . aspirin EC  81 mg Oral Daily  . atorvastatin  40 mg Oral q1800  . demeclocycline  150 mg Oral Q12H  . dextrose  25 mL Intravenous Once  . enoxaparin (LOVENOX) injection  40 mg Subcutaneous Daily  . feeding supplement (GLUCERNA SHAKE)  237 mL Oral TID BM  . insulin aspart  0-9 Units Subcutaneous TID WC  . insulin glargine  30 Units Subcutaneous QHS  . isosorbide mononitrate  30 mg Oral Daily  . losartan  50 mg Oral Daily  . metoprolol tartrate  25 mg Oral BID   Continuous Infusions: . sodium chloride 30 mL/hr at 04/13/17 1336  . 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   LOS: 5 days    Calvin Dials  MD  04/13/2017, 7:39 PM

## 2017-04-14 ENCOUNTER — Inpatient Hospital Stay (HOSPITAL_COMMUNITY): Payer: Medicare HMO

## 2017-04-14 LAB — GLUCOSE, CAPILLARY
GLUCOSE-CAPILLARY: 192 mg/dL — AB (ref 65–99)
Glucose-Capillary: 70 mg/dL (ref 65–99)

## 2017-04-14 LAB — PSA: Prostatic Specific Antigen: 0.08 ng/mL (ref 0.00–4.00)

## 2017-04-14 LAB — BASIC METABOLIC PANEL
ANION GAP: 8 (ref 5–15)
BUN: 8 mg/dL (ref 6–20)
CHLORIDE: 101 mmol/L (ref 101–111)
CO2: 21 mmol/L — ABNORMAL LOW (ref 22–32)
Calcium: 8.7 mg/dL — ABNORMAL LOW (ref 8.9–10.3)
Creatinine, Ser: 0.78 mg/dL (ref 0.61–1.24)
GFR calc Af Amer: >60 mL/min (ref >60)
GLUCOSE: 71 mg/dL (ref 65–99)
POTASSIUM: 3.7 mmol/L (ref 3.5–5.1)
Sodium: 130 mmol/L — ABNORMAL LOW (ref 135–145)

## 2017-04-14 LAB — CBC
HEMATOCRIT: 29.1 % — AB (ref 39.0–52.0)
HEMOGLOBIN: 9.4 g/dL — AB (ref 13.0–17.0)
MCH: 26.2 pg (ref 26.0–34.0)
MCHC: 32.3 g/dL (ref 30.0–36.0)
MCV: 81.1 fL (ref 78.0–100.0)
PLATELETS: 266 10*3/uL (ref 150–400)
RBC: 3.59 MIL/uL — AB (ref 4.22–5.81)
RDW: 16.1 % — ABNORMAL HIGH (ref 11.5–15.5)
WBC: 9 10*3/uL (ref 4.0–10.5)

## 2017-04-14 MED ORDER — STARCH (THICKENING) PO POWD
ORAL | Status: DC | PRN
  Filled 2017-04-14: qty 227

## 2017-04-14 MED ORDER — GLUCERNA SHAKE PO LIQD: 237.0000 mL | Freq: Two times a day (BID) | ORAL | 6 refills | Status: AC

## 2017-04-14 MED ORDER — VALSARTAN 80 MG PO TABS: 80.0000 mg | ORAL_TABLET | Freq: Every day | ORAL | 2 refills | Status: AC

## 2017-04-14 MED ORDER — GABAPENTIN 100 MG PO CAPS: 100.0000 mg | ORAL_CAPSULE | Freq: Three times a day (TID) | ORAL | Status: AC

## 2017-04-14 MED ORDER — DEMECLOCYCLINE HCL 150 MG PO TABS: 150.0000 mg | ORAL_TABLET | Freq: Two times a day (BID) | ORAL | 3 refills | Status: AC

## 2017-04-14 MED ORDER — INSULIN GLARGINE 100 UNIT/ML ~~LOC~~ SOLN: 20.0000 [IU] | Freq: Every day | SUBCUTANEOUS | 11 refills | Status: AC

## 2017-04-14 MED ORDER — KLOR-CON 10 10 MEQ PO TBCR: 10.0000 meq | EXTENDED_RELEASE_TABLET | ORAL | 3 refills | Status: AC

## 2017-04-14 MED ORDER — STARCH (THICKENING) PO POWD: 5.0000 g | ORAL | 3 refills | Status: AC | PRN

## 2017-04-14 MED ORDER — INSULIN GLARGINE 100 UNIT/ML ~~LOC~~ SOLN
20.0000 [IU] | Freq: Every day | SUBCUTANEOUS | Status: DC
  Filled 2017-04-14: qty 0.2

## 2017-04-14 NOTE — Progress Notes (Signed)
Patient and family discharge teaching given, including activity, diet, follow-up appoints, and medications. Patient's family verbalized understanding of all discharge instructions. IV access was d/c'd. Vitals are stable. Skin is intact except as charted in most recent assessments. Pt to be escorted out by NT, to be driven home by family.

## 2017-04-14 NOTE — Progress Notes (Signed)
Patient ID: Calvin Perez, male   DOB: 1941/01/03, 76 y.o.   MRN: [id number]    Assessment: Intra-abdominal process - at this point it appears the process occurring intra-abdominally is non-urologic. His cystoscopic evaluation was negative for any abnormality of the bladder mucosa, his urine has remained completely clear each time it has been checked and now his PSA has found to be completely normal at 0.08. It would appear percutaneous biopsy of one of his enlarged lymph nodes would be the next procedure I would recommend. I will send urine cytology for completeness sake although feel the yield is going to be low.  Plan:  1. Urine for cytology.  2. Please contact me if cytology reveals any malignant cells otherwise I will sign off.   Subjective: Patient reports no new complaints  Objective: Vital signs in last 24 hours: Temp:  [98.1 F (36.7 C)-98.2 F (36.8 C)] 98.2 F (36.8 C) (07/13 2139) Pulse Rate:  [63-77] 76 (07/13 2139) Resp:  [13-18] 18 (07/13 2139) BP: (116-171)/(52-69) 160/69 (07/13 2139) SpO2:  [96 %-100 %] 100 % (07/13 2139)A  Intake/Output from previous day: 07/13 0701 - 07/14 0700 In: 844.5 [P.O.:120; I.V.:724.5] Out: 350 [Urine:350] Intake/Output this shift: Total I/O In: -  Out: 175 [Urine:175]  Past Medical History:  Diagnosis Date  . Arthritis   . Coronary artery disease   . Diabetes mellitus    insulin dependent  . Dyslipidemia   . Hypertension     Physical Exam:  Lungs - Normal respiratory effort, chest expands symmetrically.  Abdomen - Soft, non-tender & non-distended.  Lab Results:  Recent Labs  04/14/17 0601  WBC 9.0  HGB 9.4*  HCT 29.1*   BMET  Recent Labs  04/13/17 0455 04/14/17 0601  NA 130* 130*  K  --  3.7  CL  --  101  CO2  --  21*  GLUCOSE  --  71  BUN  --  8  CREATININE  --  0.78  CALCIUM  --  8.7*   No results for input(s): LABURIN in the last 72 hours. Results for orders placed or performed during the hospital  encounter of 09/15/13  MRSA PCR Screening     Status: None   Collection Time: 09/15/13  8:08 PM  Result Value Ref Range Status   MRSA by PCR NEGATIVE NEGATIVE Final    Comment:        The GeneXpert MRSA Assay (FDA approved for NASAL specimens only), is one component of a comprehensive MRSA colonization surveillance program. It is not intended to diagnose MRSA infection nor to guide or monitor treatment for MRSA infections.    Studies/Results: No results found.    Leib Elahi C 04/14/2017, 7:41 AM

## 2017-04-14 NOTE — Care Management Note (Signed)
Case Management Note  Patient Details  Name: Calvin Perez MRN: [id number] Date of Birth: 1941/06/11  Subjective/Objective:                 Patient with order to DC to home. Active w [facility] PTA, referral placed to resume services with PT [facility] RN. No DM rec from PT, no other CM needs identified.    Action/Plan:  Will DC to home w [place].   Expected Discharge Date:  04/14/17               Expected Discharge Plan:  [place]  In-House Referral:     Discharge planning Services  CM Consult  Post Acute Care Choice:  Resumption of Svcs/PTA Provider Choice offered to:  Patient  DME Arranged:    DME Agency:     HH Arranged:  Nurse's Aide, PT, RN [place] Agency:  [place]  Status of Service:  Completed, signed off  If discussed at [place] of Stay Meetings, dates discussed:    Additional Comments:  Carles Collet, RN 04/14/2017, 10:22 AM

## 2017-04-14 NOTE — Progress Notes (Signed)
Modified Barium Swallow Progress Note  Patient Details  Name: Calvin Perez MRN: [id number] Date of Birth: 01-03-1941  Today's Date: 04/14/2017  Modified Barium Swallow completed.  Full report located under Chart Review in the Imaging Section.  Brief recommendations include the following:  Clinical Impression  Patient presents with moderate sensory motor pharyngeal dysphagia with silent aspiration of thin liquids. Swallowing function appears consistent with performance on MBS performed in 2014. With 25% of trials, pt penetrated thin liquids before the swallow with subsequent silent aspiration during the swallow. Even with significant aspiration, pt did not sense airway compromise, and cued cough was ineffective for airway clearance. 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 With nectar and honey-thick liquids, pt with intermittent trace penetration. Compensatory manuevers including bolus hold, chin tuck ineffective for airway protection. Trace aspiration x1 of nectar thick liquid with barium tablet, due to reduced coordination with mixed consistency. Spoke with pt's daughter re: findings via telephone and discussed pt's risks given known silent aspiration. Per her report, pt has been consuming thin liquids and has not had pneumonia. However, she expresses desire to adhere to conservative recommendation of nectar-thick liquids with regular solids during this acute hospitalization. SLP provided education re: free water protocol. Recommend pt consume nectar-thick liquids with regular solids, medications whole in puree. Pt may have thin water between meals only within 30 minutes of oral care. Patient may benefit from ENT referral to assess suspected vocal fold impairment, as well as home health SLP f/u for dysphagia.    Swallow Evaluation Recommendations   Recommended Consults: Consider ENT  evaluation   SLP Diet Recommendations: Regular solids;Nectar thick liquid;Free water protocol after oral care   Liquid Administration via: Cup   Medication Administration: Whole meds with puree   Supervision: Patient able to self feed   Compensations: Slow rate;Small sips/bites   Postural Changes: Seated upright at 90 degrees   Oral Care Recommendations: Oral care BID   Other Recommendations: Order thickener from [place], [place], [place] Speech-Language Pathologist [place] 04/14/2017,1:54 PM

## 2017-04-14 NOTE — Discharge Summary (Signed)
Physician Discharge Summary  Patient ID: Calvin Perez MRN: [id number] DOB/AGE: 78-Aug-1942 78 y.o.  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 Hypertension Uncontrolled diabetes mellitus Hyperlipidemia History of CVA with right-sided weakness Anemia of chronic disease next line degenerative joint disease  Discharge Diagnoses:  Principal problem: * SIADH * Active Problems:   Generalized weakness, malaise and leg cramps   Coronary artery disease   Status post CABG   History of inferior wall myocardial infarction   History of PCI to right coronary artery   History of hypertension   Uncontrolled diabetes mellitus, type 2   Hyperlipidemia   History of cerebrovascular accident with right-sided weakness   Anemia of chronic disease   Degenerative joint disease    Pelvic mass and lymphadenopathy  Discharged Condition: fair  Hospital Course: 76 year old male presented with weakness, tingling and cramp and poor appetite. He had severe hyponatremia improving marginally with IV saline. Endocrinology consult suggested SIADH treatment. Patient's sodium level improved but weakness and poor appetite continued requiring lower doses of insulin. GI and Urology consultations and evaluations post possible pelvic mass discovered on CT of abdomen and pelvis showed normal findings including PSA level and urinalysis. SNF was recommended but patient refused. Home health PT and Nurse is arranged. Patient has fair support at home for now.  He will see me in 1 week at which time biopsy of lymph node will be discussed again.  Consults: GI, urology and Endocrinology  Significant Diagnostic Studies: labs: Normal WBC count slightly elevated platelets count low hemoglobin of 10.8 within  normal iron level. Basic metabolic panel showed sodium of 112 mEq, potassium of 5.3 mEq, blood glucose of 282 mg and calcium of 8.4 mg.  Sodium level on day of discharge was 130 mEq. Lipid panel was essentially unremarkable with a total cholesterol of 102, LDL cholesterol of 61 and low HDL cholesterol of 25 mg. TSH was normal at 1.02, hemoglobin A1c was elevated at 8.5, cortisol level was normal at 18.2 mcgand T4 level was elevated at 1.50 ng. Uric acid level was low at 3.6 mg. Serum osmolality was low at 258 mOsm per kilogram. Urine osmolality was low at 229 mOsm per kilogram. PSA was 0.08 ng. Urine cytology was pending at the time of dictation.  Treatments: IV saline followed by Demeclocycline, regular salt diet, feeding supplement, aspirin, atorvastatin, imdur, metoprolol, potassium, valsartan, colesevelam.  Discharge Exam: Blood pressure (!) 145/67, pulse 98, temperature 98.6 F (37 C), resp. rate 18, height 5\' 3"  (1.6 m), weight 75.9 kg (167 lb 4.8 oz), SpO2 100 %. General appearance: alert, cooperative and appears stated age. Head: Normocephalic, atraumatic. Eyes: Brown eyes, pale pink conjunctiva, corneas clear. PERRL, EOM's intact.  Neck: No adenopathy, no carotid bruit, no JVD, supple, symmetrical, trachea midline and thyroid not enlarged. Resp: Clear to auscultation bilaterally. Cardio: Regular rate and rhythm, S1, S2 normal, II/VI systolic murmur, no click, rub or gallop. GI: Soft, non-tender; bowel sounds normal; no organomegaly. Extremities: No edema, cyanosis or clubbing. Skin: Warm and dry.  Neurologic: Alert and oriented X 2, decreased strength and gait abnormality.  Disposition: 01-Home or Self Care + Home health care.   Allergies as of 04/14/2017   No Known Allergies     Medication List    STOP taking these medications   furosemide 40 MG tablet  Commonly known as:  LASIX   insulin aspart 100 UNIT/ML injection Commonly known as:  novoLOG   insulin lispro 100  UNIT/ML injection Commonly known as:  HUMALOG   insulin NPH Human 100 UNIT/ML injection Commonly known as:  NOVOLIN N RELION   insulin regular 100 units/mL injection Commonly known as:  NOVOLIN R   metFORMIN 500 MG 24 hr tablet Commonly known as:  GLUCOPHAGE-XR   ticagrelor 90 MG Tabs tablet Commonly known as:  BRILINTA     TAKE these medications   ACCU-CHEK AVIVA PLUS test strip Generic drug:  glucose blood USE AS DIRECTED 3 TIMES A DAY   aspirin 81 MG tablet Take 81 mg by mouth daily.   atorvastatin 80 MG tablet Commonly known as:  LIPITOR Take 1 tablet (80 mg total) by mouth daily.   colesevelam 625 MG tablet Commonly known as:  WELCHOL Take 3 tablets twice a day What changed:  how much to take  how to take this  when to take this  additional instructions   demeclocycline 150 MG tablet Commonly known as:  DECLOMYCIN Take 1 tablet (150 mg total) by mouth 2 (two) times daily.   feeding supplement (GLUCERNA SHAKE) Liqd Take 237 mLs by mouth 2 (two) times daily between meals.   gabapentin 100 MG capsule Commonly known as:  NEURONTIN Take 1 capsule (100 mg total) by mouth 3 (three) times daily. What changed:  medication strength  how much to take   insulin glargine 100 UNIT/ML injection Commonly known as:  LANTUS Inject 0.2 mLs (20 Units total) into the skin at bedtime.   isosorbide mononitrate 30 MG 24 hr tablet Commonly known as:  IMDUR Take 1 tablet (30 mg total) by mouth daily.   KLOR-CON 10 10 MEQ tablet Generic drug:  potassium chloride Take 1 tablet (10 mEq total) by mouth every Monday, Wednesday, and Friday. What changed:  when to take this   metoprolol tartrate 12.5 mg Tabs tablet Commonly known as:  LOPRESSOR Take 0.5 tablets (12.5 mg total) by mouth 2 (two) times daily.   nitroGLYCERIN 0.4 MG SL tablet Commonly known as:  NITROSTAT Place 1 tablet (0.4 mg total) under the tongue every 5 (five) minutes x 3 doses as needed for chest  pain.   valsartan 80 MG tablet Commonly known as:  DIOVAN Take 1 tablet (80 mg total) by mouth daily. What changed:  medication strength  See the new instructions.      Follow-up Information    Dixie Dials, MD. Schedule an appointment as soon as possible for a visit in 1 week(s).   Specialty:  Cardiology Contact information: [place] [address] [phone]           Signed: Birdie Riddle 04/14/2017, 12:35 PM

## 2017-04-16 ENCOUNTER — Encounter (HOSPITAL_COMMUNITY): Payer: Self-pay | Admitting: Gastroenterology

## 2017-04-16 LAB — GLUCOSE, CAPILLARY: GLUCOSE-CAPILLARY: 168 mg/dL — AB (ref 65–99)

## 2017-04-23 ENCOUNTER — Inpatient Hospital Stay (HOSPITAL_COMMUNITY)
Admission: EM | Admit: 2017-04-23 | Discharge: 2017-06-02 | DRG: 643 | Disposition: E | Payer: Medicare HMO | Attending: Cardiovascular Disease | Admitting: Cardiovascular Disease

## 2017-04-23 ENCOUNTER — Encounter (HOSPITAL_COMMUNITY): Payer: Self-pay | Admitting: Emergency Medicine

## 2017-04-23 DIAGNOSIS — I252 Old myocardial infarction: Secondary | ICD-10-CM

## 2017-04-23 DIAGNOSIS — D638 Anemia in other chronic diseases classified elsewhere: Secondary | ICD-10-CM | POA: Diagnosis present

## 2017-04-23 DIAGNOSIS — N179 Acute kidney failure, unspecified: Secondary | ICD-10-CM | POA: Diagnosis not present

## 2017-04-23 DIAGNOSIS — E222 Syndrome of inappropriate secretion of antidiuretic hormone: Principal | ICD-10-CM | POA: Diagnosis present

## 2017-04-23 DIAGNOSIS — E871 Hypo-osmolality and hyponatremia: Secondary | ICD-10-CM

## 2017-04-23 DIAGNOSIS — R1310 Dysphagia, unspecified: Secondary | ICD-10-CM

## 2017-04-23 DIAGNOSIS — R4182 Altered mental status, unspecified: Secondary | ICD-10-CM

## 2017-04-23 DIAGNOSIS — C801 Malignant (primary) neoplasm, unspecified: Secondary | ICD-10-CM | POA: Diagnosis present

## 2017-04-23 DIAGNOSIS — R0603 Acute respiratory distress: Secondary | ICD-10-CM

## 2017-04-23 DIAGNOSIS — I2510 Atherosclerotic heart disease of native coronary artery without angina pectoris: Secondary | ICD-10-CM | POA: Diagnosis present

## 2017-04-23 DIAGNOSIS — Z66 Do not resuscitate: Secondary | ICD-10-CM | POA: Diagnosis not present

## 2017-04-23 DIAGNOSIS — Z6826 Body mass index (BMI) 26.0-26.9, adult: Secondary | ICD-10-CM

## 2017-04-23 DIAGNOSIS — K7200 Acute and subacute hepatic failure without coma: Secondary | ICD-10-CM | POA: Diagnosis present

## 2017-04-23 DIAGNOSIS — R0682 Tachypnea, not elsewhere classified: Secondary | ICD-10-CM | POA: Diagnosis present

## 2017-04-23 DIAGNOSIS — Z794 Long term (current) use of insulin: Secondary | ICD-10-CM

## 2017-04-23 DIAGNOSIS — R945 Abnormal results of liver function studies: Secondary | ICD-10-CM | POA: Diagnosis present

## 2017-04-23 DIAGNOSIS — E1165 Type 2 diabetes mellitus with hyperglycemia: Secondary | ICD-10-CM | POA: Diagnosis present

## 2017-04-23 DIAGNOSIS — C779 Secondary and unspecified malignant neoplasm of lymph node, unspecified: Secondary | ICD-10-CM | POA: Diagnosis present

## 2017-04-23 DIAGNOSIS — R29898 Other symptoms and signs involving the musculoskeletal system: Secondary | ICD-10-CM

## 2017-04-23 DIAGNOSIS — D509 Iron deficiency anemia, unspecified: Secondary | ICD-10-CM

## 2017-04-23 DIAGNOSIS — R296 Repeated falls: Secondary | ICD-10-CM | POA: Diagnosis present

## 2017-04-23 DIAGNOSIS — Z955 Presence of coronary angioplasty implant and graft: Secondary | ICD-10-CM

## 2017-04-23 DIAGNOSIS — E440 Moderate protein-calorie malnutrition: Secondary | ICD-10-CM | POA: Diagnosis present

## 2017-04-23 DIAGNOSIS — I10 Essential (primary) hypertension: Secondary | ICD-10-CM | POA: Diagnosis present

## 2017-04-23 DIAGNOSIS — E785 Hyperlipidemia, unspecified: Secondary | ICD-10-CM | POA: Diagnosis present

## 2017-04-23 DIAGNOSIS — Z951 Presence of aortocoronary bypass graft: Secondary | ICD-10-CM

## 2017-04-23 DIAGNOSIS — I69351 Hemiplegia and hemiparesis following cerebral infarction affecting right dominant side: Secondary | ICD-10-CM

## 2017-04-23 DIAGNOSIS — C7951 Secondary malignant neoplasm of bone: Secondary | ICD-10-CM | POA: Diagnosis present

## 2017-04-23 DIAGNOSIS — K224 Dyskinesia of esophagus: Secondary | ICD-10-CM | POA: Diagnosis present

## 2017-04-23 DIAGNOSIS — I471 Supraventricular tachycardia: Secondary | ICD-10-CM | POA: Diagnosis present

## 2017-04-23 DIAGNOSIS — R932 Abnormal findings on diagnostic imaging of liver and biliary tract: Secondary | ICD-10-CM

## 2017-04-23 DIAGNOSIS — Z515 Encounter for palliative care: Secondary | ICD-10-CM | POA: Diagnosis present

## 2017-04-23 DIAGNOSIS — C787 Secondary malignant neoplasm of liver and intrahepatic bile duct: Secondary | ICD-10-CM | POA: Diagnosis present

## 2017-04-23 DIAGNOSIS — K5900 Constipation, unspecified: Secondary | ICD-10-CM | POA: Diagnosis present

## 2017-04-23 DIAGNOSIS — Z9889 Other specified postprocedural states: Secondary | ICD-10-CM

## 2017-04-23 NOTE — ED Triage Notes (Signed)
Pt arrives via EMS from home, called out by family for concern for dehydration and inability for them to care for him after recent hospitalization. Increasing weakness over the last several weeks.Near fall this evening when he was ambulating, caught himself on the wall. D/C'd from hospital last week for dehydration. Family concerned that this may be the case again. Supposed to start physical therapy soon. Hx stroke with R sided deficits. No family at bedside at this time.

## 2017-04-23 NOTE — ED Notes (Signed)
Assisted pt with urinal, unable to void at this time.   Consulted Dr. Roxanne Mins regarding triage orders, RN not sure if standing orders for weakness are appropriate for pt at this time. Per Dr. Roxanne Mins, go ahead with triage orders

## 2017-04-24 ENCOUNTER — Emergency Department (HOSPITAL_COMMUNITY): Payer: Medicare HMO

## 2017-04-24 DIAGNOSIS — K7200 Acute and subacute hepatic failure without coma: Secondary | ICD-10-CM | POA: Diagnosis present

## 2017-04-24 DIAGNOSIS — E871 Hypo-osmolality and hyponatremia: Secondary | ICD-10-CM | POA: Diagnosis present

## 2017-04-24 DIAGNOSIS — I69351 Hemiplegia and hemiparesis following cerebral infarction affecting right dominant side: Secondary | ICD-10-CM | POA: Diagnosis not present

## 2017-04-24 DIAGNOSIS — E785 Hyperlipidemia, unspecified: Secondary | ICD-10-CM | POA: Diagnosis present

## 2017-04-24 DIAGNOSIS — K224 Dyskinesia of esophagus: Secondary | ICD-10-CM | POA: Diagnosis present

## 2017-04-24 DIAGNOSIS — Z9889 Other specified postprocedural states: Secondary | ICD-10-CM | POA: Diagnosis not present

## 2017-04-24 DIAGNOSIS — Z515 Encounter for palliative care: Secondary | ICD-10-CM | POA: Diagnosis present

## 2017-04-24 DIAGNOSIS — C779 Secondary and unspecified malignant neoplasm of lymph node, unspecified: Secondary | ICD-10-CM | POA: Diagnosis present

## 2017-04-24 DIAGNOSIS — Z66 Do not resuscitate: Secondary | ICD-10-CM | POA: Diagnosis not present

## 2017-04-24 DIAGNOSIS — C7951 Secondary malignant neoplasm of bone: Secondary | ICD-10-CM | POA: Diagnosis present

## 2017-04-24 DIAGNOSIS — Z794 Long term (current) use of insulin: Secondary | ICD-10-CM | POA: Diagnosis not present

## 2017-04-24 DIAGNOSIS — E222 Syndrome of inappropriate secretion of antidiuretic hormone: Secondary | ICD-10-CM | POA: Diagnosis present

## 2017-04-24 DIAGNOSIS — R945 Abnormal results of liver function studies: Secondary | ICD-10-CM | POA: Diagnosis present

## 2017-04-24 DIAGNOSIS — I471 Supraventricular tachycardia: Secondary | ICD-10-CM | POA: Diagnosis present

## 2017-04-24 DIAGNOSIS — I252 Old myocardial infarction: Secondary | ICD-10-CM | POA: Diagnosis not present

## 2017-04-24 DIAGNOSIS — R0682 Tachypnea, not elsewhere classified: Secondary | ICD-10-CM | POA: Diagnosis present

## 2017-04-24 DIAGNOSIS — I10 Essential (primary) hypertension: Secondary | ICD-10-CM | POA: Diagnosis present

## 2017-04-24 DIAGNOSIS — E1165 Type 2 diabetes mellitus with hyperglycemia: Secondary | ICD-10-CM | POA: Diagnosis present

## 2017-04-24 DIAGNOSIS — D638 Anemia in other chronic diseases classified elsewhere: Secondary | ICD-10-CM | POA: Diagnosis present

## 2017-04-24 DIAGNOSIS — Z955 Presence of coronary angioplasty implant and graft: Secondary | ICD-10-CM | POA: Diagnosis not present

## 2017-04-24 DIAGNOSIS — E440 Moderate protein-calorie malnutrition: Secondary | ICD-10-CM | POA: Diagnosis present

## 2017-04-24 DIAGNOSIS — C787 Secondary malignant neoplasm of liver and intrahepatic bile duct: Secondary | ICD-10-CM | POA: Diagnosis present

## 2017-04-24 DIAGNOSIS — Z951 Presence of aortocoronary bypass graft: Secondary | ICD-10-CM | POA: Diagnosis not present

## 2017-04-24 DIAGNOSIS — I2510 Atherosclerotic heart disease of native coronary artery without angina pectoris: Secondary | ICD-10-CM | POA: Diagnosis present

## 2017-04-24 DIAGNOSIS — N179 Acute kidney failure, unspecified: Secondary | ICD-10-CM | POA: Diagnosis not present

## 2017-04-24 LAB — BASIC METABOLIC PANEL
ANION GAP: 8 (ref 5–15)
Anion gap: 10 (ref 5–15)
BUN: 8 mg/dL (ref 6–20)
BUN: 9 mg/dL (ref 6–20)
CHLORIDE: 79 mmol/L — AB (ref 101–111)
CHLORIDE: 84 mmol/L — AB (ref 101–111)
CO2: 21 mmol/L — AB (ref 22–32)
CO2: 21 mmol/L — AB (ref 22–32)
CREATININE: 0.69 mg/dL (ref 0.61–1.24)
CREATININE: 0.75 mg/dL (ref 0.61–1.24)
Calcium: 8.4 mg/dL — ABNORMAL LOW (ref 8.9–10.3)
Calcium: 8.9 mg/dL (ref 8.9–10.3)
GFR calc non Af Amer: >60 mL/min (ref >60)
GFR calc non Af Amer: >60 mL/min (ref >60)
Glucose, Bld: 158 mg/dL — ABNORMAL HIGH (ref 65–99)
Glucose, Bld: 204 mg/dL — ABNORMAL HIGH (ref 65–99)
POTASSIUM: 4.5 mmol/L (ref 3.5–5.1)
POTASSIUM: 4.9 mmol/L (ref 3.5–5.1)
Sodium: 110 mmol/L — CL (ref 135–145)
Sodium: 113 mmol/L — CL (ref 135–145)

## 2017-04-24 LAB — URINALYSIS, ROUTINE W REFLEX MICROSCOPIC
BACTERIA UA: NONE SEEN
BILIRUBIN URINE: NEGATIVE
Glucose, UA: 50 mg/dL — AB
Hgb urine dipstick: NEGATIVE
KETONES UR: 5 mg/dL — AB
LEUKOCYTES UA: NEGATIVE
Nitrite: NEGATIVE
PROTEIN: 100 mg/dL — AB
SQUAMOUS EPITHELIAL / LPF: NONE SEEN
Specific Gravity, Urine: 1.017 (ref 1.005–1.030)
pH: 6 (ref 5.0–8.0)

## 2017-04-24 LAB — CBC
HEMATOCRIT: 26.7 % — AB (ref 39.0–52.0)
HEMATOCRIT: 29.4 % — AB (ref 39.0–52.0)
HEMOGLOBIN: 10.1 g/dL — AB (ref 13.0–17.0)
HEMOGLOBIN: 9.3 g/dL — AB (ref 13.0–17.0)
MCH: 26 pg (ref 26.0–34.0)
MCH: 26.6 pg (ref 26.0–34.0)
MCHC: 34.4 g/dL (ref 30.0–36.0)
MCHC: 34.8 g/dL (ref 30.0–36.0)
MCV: 75.6 fL — AB (ref 78.0–100.0)
MCV: 76.3 fL — AB (ref 78.0–100.0)
Platelets: 213 10*3/uL (ref 150–400)
Platelets: 255 10*3/uL (ref 150–400)
RBC: 3.5 MIL/uL — AB (ref 4.22–5.81)
RBC: 3.89 MIL/uL — AB (ref 4.22–5.81)
RDW: 16.1 % — ABNORMAL HIGH (ref 11.5–15.5)
RDW: 16.8 % — ABNORMAL HIGH (ref 11.5–15.5)
WBC: 10.2 10*3/uL (ref 4.0–10.5)
WBC: 9.5 10*3/uL (ref 4.0–10.5)

## 2017-04-24 LAB — GLUCOSE, CAPILLARY: Glucose-Capillary: 151 mg/dL — ABNORMAL HIGH (ref 65–99)

## 2017-04-24 LAB — CBG MONITORING, ED: Glucose-Capillary: 211 mg/dL — ABNORMAL HIGH (ref 65–99)

## 2017-04-24 MED ORDER — DEMECLOCYCLINE HCL 150 MG PO TABS
150.0000 mg | ORAL_TABLET | Freq: Two times a day (BID) | ORAL | Status: DC
  Administered 2017-04-24 – 2017-04-25 (×4): 150 mg via ORAL
  Filled 2017-04-24 (×5): qty 1

## 2017-04-24 MED ORDER — ATORVASTATIN CALCIUM 40 MG PO TABS
80.0000 mg | ORAL_TABLET | Freq: Every day | ORAL | Status: DC
  Administered 2017-04-24 – 2017-04-28 (×5): 80 mg via ORAL
  Filled 2017-04-24 (×6): qty 2

## 2017-04-24 MED ORDER — SODIUM CHLORIDE 0.9 % IV SOLN
INTRAVENOUS | Status: DC
  Administered 2017-04-24 – 2017-04-30 (×9): via INTRAVENOUS

## 2017-04-24 MED ORDER — INSULIN GLARGINE 100 UNIT/ML ~~LOC~~ SOLN
20.0000 [IU] | Freq: Every day | SUBCUTANEOUS | Status: DC
  Administered 2017-04-24: 20 [IU] via SUBCUTANEOUS
  Filled 2017-04-24: qty 0.2

## 2017-04-24 MED ORDER — NITROGLYCERIN 0.4 MG SL SUBL: 0.4000 mg | SUBLINGUAL_TABLET | SUBLINGUAL | Status: DC | PRN

## 2017-04-24 MED ORDER — ISOSORBIDE MONONITRATE ER 30 MG PO TB24
30.0000 mg | ORAL_TABLET | Freq: Every day | ORAL | Status: DC
  Administered 2017-04-24 – 2017-04-28 (×5): 30 mg via ORAL
  Filled 2017-04-24 (×5): qty 1

## 2017-04-24 MED ORDER — SODIUM CHLORIDE 0.9 % IV BOLUS (SEPSIS)
1000.0000 mL | Freq: Once | INTRAVENOUS | Status: AC
  Administered 2017-04-24: 1000 mL via INTRAVENOUS

## 2017-04-24 MED ORDER — ASPIRIN EC 81 MG PO TBEC
81.0000 mg | DELAYED_RELEASE_TABLET | Freq: Every day | ORAL | Status: DC
  Administered 2017-04-24 – 2017-04-27 (×4): 81 mg via ORAL
  Filled 2017-04-24 (×5): qty 1

## 2017-04-24 MED ORDER — GABAPENTIN 100 MG PO CAPS
100.0000 mg | ORAL_CAPSULE | Freq: Three times a day (TID) | ORAL | Status: DC
  Administered 2017-04-24 – 2017-04-26 (×9): 100 mg via ORAL
  Filled 2017-04-24 (×10): qty 1

## 2017-04-24 MED ORDER — HEPARIN SODIUM (PORCINE) 5000 UNIT/ML IJ SOLN
5000.0000 [IU] | Freq: Three times a day (TID) | INTRAMUSCULAR | Status: DC
  Administered 2017-04-24 – 2017-05-01 (×20): 5000 [IU] via SUBCUTANEOUS
  Filled 2017-04-24 (×18): qty 1

## 2017-04-24 MED ORDER — METOPROLOL TARTRATE 12.5 MG HALF TABLET
12.5000 mg | ORAL_TABLET | Freq: Two times a day (BID) | ORAL | Status: DC
  Administered 2017-04-24 – 2017-04-28 (×9): 12.5 mg via ORAL
  Filled 2017-04-24 (×9): qty 1

## 2017-04-24 MED ORDER — RESOURCE THICKENUP CLEAR PO POWD
ORAL | Status: DC | PRN
  Filled 2017-04-24: qty 125

## 2017-04-24 NOTE — ED Notes (Signed)
Date and time results received: 04/24/17 6:36 AM   Test: Sodium Critical Value: 113  Name of Provider Notified: Doylene Canard  Orders Received? Or Actions Taken?: verbally acknowledged, no new orders

## 2017-04-24 NOTE — Progress Notes (Signed)
Family came and coaxed after an hour to be placed on telemetry, to put on continuous pulse ox., and iv fluids. Will continue to monitor.

## 2017-04-24 NOTE — ED Provider Notes (Signed)
[facility] DEPT Provider Note   CSN: [id number] Arrival date & time: 04/07/2017  2343  By signing my name below, I, Ny'Kea Lewis, attest that this documentation has been prepared under the direction and in the presence of Delora Fuel, MD. Electronically Signed: Lise Auer, ED Scribe. 04/24/17. 1:33 AM.  History   Chief Complaint Chief Complaint  Patient presents with  . Weakness    Chronic   The history is provided by a relative. The history is limited by the condition of the patient. No language interpreter was used.  LEVEL V CAVEAT: HPI and ROS limited due to altered mental status.   HPI  HPI Comments: Calvin Perez is a 76 y.o. male with a PMHx of CAD, DM II, and HTN, brought in by ambulance, who presents to the Emergency Department complaining of gradually worsening, acute on chronic weakness that began two weeks ago. Family notes associated vomiting (two episodes today), reduced mobility, decreased appetite, and recurrent falls. Daughter reports since being discharged on 7/14 from the hospital after being diagnosed with hyponatremia, he has been increasingly weak. Today she reports while in the bathroom with assistance, he fell and hit his head. She denies LOC. She states he has an at home physical therapist but he has been refusing to walk, and needing an increasing amount of assistance. Denies fever.  Past Medical History:  Diagnosis Date  . Arthritis   . Coronary artery disease   . Diabetes mellitus    insulin dependent  . Dyslipidemia   . Hypertension    Patient Active Problem List   Diagnosis Date Noted  . Hyponatremia 04/07/2017  . Pure hypercholesterolemia 11/11/2013  . CAD (coronary atherosclerotic disease) 10/09/2013  . 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 PBA (pseudobulbar affect) 07/01/2013  . Type II or unspecified type diabetes mellitus without mention of complication, uncontrolled 04/21/2013  . Other and unspecified hyperlipidemia  04/21/2013  . CVA (cerebral vascular accident) ([place]) 04/21/2013  . Essential hypertension, benign 04/21/2013  . Dizziness 02/01/2012  . Gait disturbance 02/01/2012   Past Surgical History:  Procedure Laterality Date  . CARDIAC CATHETERIZATION    . CORONARY ARTERY BYPASS GRAFT    . FLEXIBLE SIGMOIDOSCOPY Left 04/13/2017   Procedure: FLEXIBLE SIGMOIDOSCOPY;  Surgeon: Arta Silence, MD;  Location: [facility] ENDOSCOPY;  Service: Endoscopy;  Laterality: Left;  . LEFT HEART CATHETERIZATION WITH CORONARY ANGIOGRAM N/A 09/15/2013   Procedure: LEFT HEART CATHETERIZATION WITH CORONARY ANGIOGRAM;  Surgeon: Clent Demark, MD;  Location: [facility] CATH LAB;  Service: Cardiovascular;  Laterality: N/A;  . open heart surgery  1998  . PERCUTANEOUS STENT INTERVENTION  09/15/2013   Procedure: PERCUTANEOUS STENT INTERVENTION;  Surgeon: Clent Demark, MD;  Location: [facility] CATH LAB;  Service: Cardiovascular;;    Home Medications    Prior to Admission medications   Medication Sig Start Date End Date Taking? Authorizing Provider  ACCU-CHEK AVIVA PLUS test strip USE AS DIRECTED 3 TIMES A DAY Patient not taking: Reported on 04/09/2017 04/10/16   Elayne Snare, MD  aspirin 81 MG tablet Take 81 mg by mouth daily.    [provider]  atorvastatin (LIPITOR) 80 MG tablet Take 1 tablet (80 mg total) by mouth daily. 11/05/14   Elayne Snare, MD  colesevelam [facility]) 625 MG tablet Take 3 tablets twice a day Patient taking differently: Take 1,875 mg by mouth 2 (two) times daily with a meal.  12/15/13   Elayne Snare, MD  demeclocycline (DECLOMYCIN) 150 MG tablet Take 1 tablet (  150 mg total) by mouth 2 (two) times daily. 04/14/17   Dixie Dials, MD  feeding supplement, GLUCERNA SHAKE, (GLUCERNA SHAKE) LIQD Take 237 mLs by mouth 2 (two) times daily between meals. 04/14/17   Dixie Dials, MD  food thickener (THICK IT) POWD Take 5 g by mouth as needed (with all liquids). 04/14/17   Dixie Dials, MD  gabapentin (NEURONTIN) 100 MG capsule  Take 1 capsule (100 mg total) by mouth 3 (three) times daily. 04/14/17   Dixie Dials, MD  insulin glargine (LANTUS) 100 UNIT/ML injection Inject 0.2 mLs (20 Units total) into the skin at bedtime. 04/14/17   Dixie Dials, MD  isosorbide mononitrate (IMDUR) 30 MG 24 hr tablet Take 1 tablet (30 mg total) by mouth daily. 09/18/13   Dixie Dials, MD  KLOR-CON 10 10 MEQ tablet Take 1 tablet (10 mEq total) by mouth every Monday, Wednesday, and Friday. 04/16/17   Dixie Dials, MD  metoprolol tartrate (LOPRESSOR) 12.5 mg TABS tablet Take 0.5 tablets (12.5 mg total) by mouth 2 (two) times daily. 09/18/13   Dixie Dials, MD  nitroGLYCERIN (NITROSTAT) 0.4 MG SL tablet Place 1 tablet (0.4 mg total) under the tongue every 5 (five) minutes x 3 doses as needed for chest pain. 09/18/13   Dixie Dials, MD  valsartan (DIOVAN) 80 MG tablet Take 1 tablet (80 mg total) by mouth daily. 04/14/17   Dixie Dials, MD    Family History No family history on file.  Social History Social History  Substance Use Topics  . Smoking status: Never Smoker  . Smokeless tobacco: Never Used  . Alcohol use No   Allergies   Patient has no known allergies.  Review of Systems Review of Systems  Unable to perform ROS: Mental status change   Physical Exam Updated Vital Signs BP 137/75   Pulse 86   Temp 98.3 F (36.8 C) (Oral)   Resp 18   SpO2 98%   Physical Exam  Constitutional: He appears well-developed and well-nourished.  HENT:  Head: Normocephalic and atraumatic.  Eyes: Pupils are equal, round, and reactive to light. EOM are normal.  Neck: Normal range of motion. Neck supple. No JVD present.  Cardiovascular: Normal rate, regular rhythm and normal heart sounds.   No murmur heard. Pulmonary/Chest: Effort normal and breath sounds normal. He has no wheezes. He has no rales. He exhibits no tenderness.  Abdominal: Soft. Bowel sounds are normal. He exhibits no distension and no mass. There is no tenderness.    Musculoskeletal: Normal range of motion. He exhibits no edema.  Lymphadenopathy:    He has no cervical adenopathy.  Neurological: He is alert. No cranial nerve deficit. He exhibits normal muscle tone. Coordination ( ) normal.  Oriented to person and place but not time.   Skin: Skin is warm and dry. No rash noted.  Psychiatric: He has a normal mood and affect. His behavior is normal. Judgment and thought content normal.  Nursing note and vitals reviewed.   ED Treatments / Results  DIAGNOSTIC STUDIES: Oxygen Saturation is 98% on RA, normal by my interpretation.   COORDINATION OF CARE: 1:08 AM-Discussed next steps with pt. Pt verbalized understanding and is agreeable with the plan.  Labs (all labs ordered are listed, but only abnormal results are displayed) Labs Reviewed  BASIC METABOLIC PANEL - Abnormal; Notable for the following:       Result Value   Sodium 110 (*)    Chloride 79 (*)    CO2 21 (*)  Glucose, Bld 204 (*)    All other components within normal limits  CBC - Abnormal; Notable for the following:    RBC 3.89 (*)    Hemoglobin 10.1 (*)    HCT 29.4 (*)    MCV 75.6 (*)    RDW 16.1 (*)    All other components within normal limits  URINALYSIS, ROUTINE W REFLEX MICROSCOPIC - Abnormal; Notable for the following:    Glucose, UA 50 (*)    Ketones, ur 5 (*)    Protein, ur 100 (*)    All other components within normal limits  CBG MONITORING, ED - Abnormal; Notable for the following:    Glucose-Capillary 211 (*)    All other components within normal limits  BASIC METABOLIC PANEL  CBC    EKG  EKG Interpretation  Date/Time:  Monday April 23 2017 23:59:33 EDT Ventricular Rate:  89 PR Interval:    QRS Duration: 87 QT Interval:  372 QTC Calculation: 453 R Axis:   20 Text Interpretation:  Sinus or ectopic atrial rhythm Prolonged PR interval Nonspecific repol abnormality, diffuse leads Baseline wander in lead(s) V4 When compared with ECG of 04/12/2017, No  significant change was found Confirmed by Delora Fuel ([id number]) on 04/24/2017 1:03:27 AM       Radiology Ct Head Wo Contrast  Result Date: 04/24/2017 CLINICAL DATA:  Multiple falls. EXAM: CT HEAD WITHOUT CONTRAST CT CERVICAL SPINE WITHOUT CONTRAST TECHNIQUE: Multidetector CT imaging of the head and cervical spine was performed following the standard protocol without intravenous contrast. Multiplanar CT image reconstructions of the cervical spine were also generated. COMPARISON:  Head CT 04/07/2017. FINDINGS: CT HEAD FINDINGS Brain: Atrophy, unchanged from prior exam stable chronic small vessel ischemia. Encephalomalacia in the left occipital lobe is unchanged. Small bilateral basal gangliar lacunar infarcts are unchanged. No evidence of acute infarction, hemorrhage, hydrocephalus, extra-axial collection or mass lesion/mass effect. Vascular: Atherosclerosis of skullbase vasculature. No hyperdense vessel. Skull: No skull fracture.  No focal lesion. Sinuses/Orbits: No acute finding. Sclerosis about the mastoid air cells with minimal opacification bilaterally, unchanged. No paranasal sinus fluid levels. Other: None. CT CERVICAL SPINE FINDINGS Alignment: No traumatic subluxation.  Facets are normally aligned. Skull base and vertebrae: No acute fracture. Vertebral body heights are maintained. The dens and skull base are intact. Soft tissues and spinal canal: No prevertebral fluid or swelling. No visible canal hematoma. Disc levels: Diffuse disc space narrowing and endplate spurring. Large bulky anterior osteophytes from C3-C4, C4-C5, and C5-C6. Scattered facet arthropathy. Upper chest: No acute abnormality. Other: Carotid calcifications with carotid stent on the right. IMPRESSION: 1. No acute intracranial abnormality. No skull fracture. Stable atrophy, chronic and remote ischemia. 2. Multilevel degenerative change in the cervical spine without acute fracture or subluxation. Bulky anterior osteophytes from C3-C4  through C5-C6. Electronically Signed   By: Jeb Levering M.D.   On: 04/24/2017 01:58   Ct Cervical Spine Wo Contrast  Result Date: 04/24/2017 CLINICAL DATA:  Multiple falls. EXAM: CT HEAD WITHOUT CONTRAST CT CERVICAL SPINE WITHOUT CONTRAST TECHNIQUE: Multidetector CT imaging of the head and cervical spine was performed following the standard protocol without intravenous contrast. Multiplanar CT image reconstructions of the cervical spine were also generated. COMPARISON:  Head CT 04/07/2017. FINDINGS: CT HEAD FINDINGS Brain: Atrophy, unchanged from prior exam stable chronic small vessel ischemia. Encephalomalacia in the left occipital lobe is unchanged. Small bilateral basal gangliar lacunar infarcts are unchanged. No evidence of acute infarction, hemorrhage, hydrocephalus, extra-axial collection or mass lesion/mass effect. Vascular: Atherosclerosis  of skullbase vasculature. No hyperdense vessel. Skull: No skull fracture.  No focal lesion. Sinuses/Orbits: No acute finding. Sclerosis about the mastoid air cells with minimal opacification bilaterally, unchanged. No paranasal sinus fluid levels. Other: None. CT CERVICAL SPINE FINDINGS Alignment: No traumatic subluxation.  Facets are normally aligned. Skull base and vertebrae: No acute fracture. Vertebral body heights are maintained. The dens and skull base are intact. Soft tissues and spinal canal: No prevertebral fluid or swelling. No visible canal hematoma. Disc levels: Diffuse disc space narrowing and endplate spurring. Large bulky anterior osteophytes from C3-C4, C4-C5, and C5-C6. Scattered facet arthropathy. Upper chest: No acute abnormality. Other: Carotid calcifications with carotid stent on the right. IMPRESSION: 1. No acute intracranial abnormality. No skull fracture. Stable atrophy, chronic and remote ischemia. 2. Multilevel degenerative change in the cervical spine without acute fracture or subluxation. Bulky anterior osteophytes from C3-C4 through  C5-C6. Electronically Signed   By: Jeb Levering M.D.   On: 04/24/2017 01:58    Procedures Procedures (including critical care time) CRITICAL CARE Performed by: HUDJS,HFWYO Total critical care time: 35 minutes Critical care time was exclusive of separately billable procedures and treating other patients. Critical care was necessary to treat or prevent imminent or life-threatening deterioration. Critical care was time spent personally by me on the following activities: development of treatment plan with patient and/or surrogate as well as nursing, discussions with consultants, evaluation of patient's response to treatment, examination of patient, obtaining history from patient or surrogate, ordering and performing treatments and interventions, ordering and review of laboratory studies, ordering and review of radiographic studies, pulse oximetry and re-evaluation of patient's condition.  Medications Ordered in ED Medications - No data to display   Initial Impression / Assessment and Plan / ED Course  I have reviewed the triage vital signs and the nursing notes.  Pertinent labs & imaging results that were available during my care of the patient were reviewed by me and considered in my medical decision making (see chart for details).  Altered mental status with weakness in patient with recent history of hyponatremia. 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 Laboratory workup today shows severe hyponatremia sodium 110. Last sodium at discharge was 130. On reviewing her medications, I question possible hyponatremia secondary to losartan. She is given IV fluids. Because of several falls at home, he is sent for CT of head and cervical spine which are unremarkable. Case is discussed with Dr. Doylene Canard, who agrees to admit the patient.  Final Clinical Impressions(s) / ED Diagnoses   Final diagnoses:  Hyponatremia  Altered mental  status, unspecified altered mental status type  Microcytic anemia    New Prescriptions New Prescriptions   No medications on file   I personally performed the services described in this documentation, which was scribed in my presence. The recorded information has been reviewed and is accurate.       Delora Fuel, MD 37/85/88 [phone]

## 2017-04-24 NOTE — ED Notes (Signed)
Date and time results received: 04/24/17 0048   Test: Na Critical Value: 110  Name of Provider Notified: Gennette Pac, RN and Dr Roxanne Mins

## 2017-04-24 NOTE — ED Notes (Signed)
Patient taken to CT on telemetry

## 2017-04-24 NOTE — Progress Notes (Signed)
Pt. Arrived on floor, refusing everything, doesn't want continuous pulse ox., no IV fluids and no cardiac monitoring. Patient requesting to leave AMA. I have explained the risks of having a low sodium and the risks of leaving, pt. States still wants to leave.

## 2017-04-24 NOTE — ED Notes (Signed)
Note sent to pharmacy to verify meds, unable to pulls meds d/t not verified.

## 2017-04-24 NOTE — ED Notes (Signed)
Pt's family reporting vomiting since Saturday, no diarrhea.

## 2017-04-24 NOTE — H&P (Signed)
Referring Physician:  SILVIA MARKUSON is an 76 y.o. male.                       Chief Complaint: Weakness and confusion  HPI: 76 year old male presented with weakness and confusion for 3 days. Patient had similar symptoms about 2 weeks ago when he was admitted for SIADH. He had nausea vomiting decreased appetite and recurrent falls. He had a fall in the bathroom without loss of consciousness and has been refusing to walk and needing an increased amount of assistance. He does not have fever or cough.  Past Medical History:  Diagnosis Date  . Arthritis   . Coronary artery disease   . Diabetes mellitus    insulin dependent  . Dyslipidemia   . Hypertension       Past Surgical History:  Procedure Laterality Date  . CARDIAC CATHETERIZATION    . CORONARY ARTERY BYPASS GRAFT    . FLEXIBLE SIGMOIDOSCOPY Left 04/13/2017   Procedure: FLEXIBLE SIGMOIDOSCOPY;  Surgeon: Arta Silence, MD;  Location: [facility] ENDOSCOPY;  Service: Endoscopy;  Laterality: Left;  . LEFT HEART CATHETERIZATION WITH CORONARY ANGIOGRAM N/A 09/15/2013   Procedure: LEFT HEART CATHETERIZATION WITH CORONARY ANGIOGRAM;  Surgeon: Clent Demark, MD;  Location: [facility] CATH LAB;  Service: Cardiovascular;  Laterality: N/A;  . open heart surgery  1998  . PERCUTANEOUS STENT INTERVENTION  09/15/2013   Procedure: PERCUTANEOUS STENT INTERVENTION;  Surgeon: Clent Demark, MD;  Location: [facility] CATH LAB;  Service: Cardiovascular;;    No family history on file. Social History:  reports that he has never smoked. He has never used smokeless tobacco. He reports that he does not drink alcohol or use drugs.  Allergies: No Known Allergies   (Not in a hospital admission)  Results for orders placed or performed during the hospital encounter of 04/01/2017 (from the past 48 hour(s))  Basic metabolic panel     Status: Abnormal   Collection Time: 04/14/2017 11:59 PM  Result Value Ref Range   Sodium 110 (LL) 135 - 145 mmol/L    Comment: REPEATED TO  VERIFY CRITICAL RESULT CALLED TO, READ BACK BY AND VERIFIED WITH: MUNNETT [facility] 04/24/17 0048 WAYK    Potassium 4.9 3.5 - 5.1 mmol/L   Chloride 79 (L) 101 - 111 mmol/L   CO2 21 (L) 22 - 32 mmol/L   Glucose, Bld 204 (H) 65 - 99 mg/dL   BUN 9 6 - 20 mg/dL   Creatinine, Ser 0.75 0.61 - 1.24 mg/dL   Calcium 8.9 8.9 - 10.3 mg/dL   GFR calc non Af Amer >60 >60 mL/min   GFR calc Af Amer >60 >60 mL/min    Comment: (NOTE) The eGFR has been calculated using the CKD EPI equation. This calculation has not been validated in all clinical situations. eGFR's persistently <60 mL/min signify possible Chronic Kidney Disease.    Anion gap 10 5 - 15  CBC     Status: Abnormal   Collection Time: 04/19/2017 11:59 PM  Result Value Ref Range   WBC 10.2 4.0 - 10.5 K/uL   RBC 3.89 (L) 4.22 - 5.81 MIL/uL   Hemoglobin 10.1 (L) 13.0 - 17.0 g/dL   HCT 29.4 (L) 39.0 - 52.0 %   MCV 75.6 (L) 78.0 - 100.0 fL   MCH 26.0 26.0 - 34.0 pg   MCHC 34.4 30.0 - 36.0 g/dL   RDW 16.1 (H) 11.5 - 15.5 %   Platelets 255 150 - 400 K/uL  CBG monitoring, ED     Status: Abnormal   Collection Time: 04/24/17 12:08 AM  Result Value Ref Range   Glucose-Capillary 211 (H) 65 - 99 mg/dL   Ct Head Wo Contrast  Result Date: 04/24/2017 CLINICAL DATA:  Multiple falls. EXAM: CT HEAD WITHOUT CONTRAST CT CERVICAL SPINE WITHOUT CONTRAST TECHNIQUE: Multidetector CT imaging of the head and cervical spine was performed following the standard protocol without intravenous contrast. Multiplanar CT image reconstructions of the cervical spine were also generated. COMPARISON:  Head CT 04/07/2017. FINDINGS: CT HEAD FINDINGS Brain: Atrophy, unchanged from prior exam stable chronic small vessel ischemia. Encephalomalacia in the left occipital lobe is unchanged. Small bilateral basal gangliar lacunar infarcts are unchanged. No evidence of acute infarction, hemorrhage, hydrocephalus, extra-axial collection or mass lesion/mass effect. Vascular: Atherosclerosis of  skullbase vasculature. No hyperdense vessel. Skull: No skull fracture.  No focal lesion. Sinuses/Orbits: No acute finding. Sclerosis about the mastoid air cells with minimal opacification bilaterally, unchanged. No paranasal sinus fluid levels. Other: None. CT CERVICAL SPINE FINDINGS Alignment: No traumatic subluxation.  Facets are normally aligned. Skull base and vertebrae: No acute fracture. Vertebral body heights are maintained. The dens and skull base are intact. Soft tissues and spinal canal: No prevertebral fluid or swelling. No visible canal hematoma. Disc levels: Diffuse disc space narrowing and endplate spurring. Large bulky anterior osteophytes from C3-C4, C4-C5, and C5-C6. Scattered facet arthropathy. Upper chest: No acute abnormality. Other: Carotid calcifications with carotid stent on the right. IMPRESSION: 1. No acute intracranial abnormality. No skull fracture. Stable atrophy, chronic and remote ischemia. 2. Multilevel degenerative change in the cervical spine without acute fracture or subluxation. Bulky anterior osteophytes from C3-C4 through C5-C6. Electronically Signed   By: Jeb Levering M.D.   On: 04/24/2017 01:58   Ct Cervical Spine Wo Contrast  Result Date: 04/24/2017 CLINICAL DATA:  Multiple falls. EXAM: CT HEAD WITHOUT CONTRAST CT CERVICAL SPINE WITHOUT CONTRAST TECHNIQUE: Multidetector CT imaging of the head and cervical spine was performed following the standard protocol without intravenous contrast. Multiplanar CT image reconstructions of the cervical spine were also generated. COMPARISON:  Head CT 04/07/2017. FINDINGS: CT HEAD FINDINGS Brain: Atrophy, unchanged from prior exam stable chronic small vessel ischemia. Encephalomalacia in the left occipital lobe is unchanged. Small bilateral basal gangliar lacunar infarcts are unchanged. No evidence of acute infarction, hemorrhage, hydrocephalus, extra-axial collection or mass lesion/mass effect. Vascular: Atherosclerosis of skullbase  vasculature. No hyperdense vessel. Skull: No skull fracture.  No focal lesion. Sinuses/Orbits: No acute finding. Sclerosis about the mastoid air cells with minimal opacification bilaterally, unchanged. No paranasal sinus fluid levels. Other: None. CT CERVICAL SPINE FINDINGS Alignment: No traumatic subluxation.  Facets are normally aligned. Skull base and vertebrae: No acute fracture. Vertebral body heights are maintained. The dens and skull base are intact. Soft tissues and spinal canal: No prevertebral fluid or swelling. No visible canal hematoma. Disc levels: Diffuse disc space narrowing and endplate spurring. Large bulky anterior osteophytes from C3-C4, C4-C5, and C5-C6. Scattered facet arthropathy. Upper chest: No acute abnormality. Other: Carotid calcifications with carotid stent on the right. IMPRESSION: 1. No acute intracranial abnormality. No skull fracture. Stable atrophy, chronic and remote ischemia. 2. Multilevel degenerative change in the cervical spine without acute fracture or subluxation. Bulky anterior osteophytes from C3-C4 through C5-C6. Electronically Signed   By: Jeb Levering M.D.   On: 04/24/2017 01:58    Review Of Systems Constitutional: No fever, chills, weight loss or gain. Eyes: No vision change, wears glasses. No discharge or  pain. Ears: No hearing loss, No tinnitus. Respiratory: No asthma, COPD, pneumonias. No shortness of breath. No hemoptysis. Cardiovascular: No chest pain, palpitation, leg edema. Gastrointestinal: No nausea, vomiting, diarrhea, constipation. No GI bleed. No hepatitis. Genitourinary: No dysuria, hematuria, kidney stone. No incontinance. Neurological: Positive headache, stroke, no seizures.  Psychiatry: No psych facility admission for anxiety, depression, suicide. No detox. Skin: No rash. Musculoskeletal: Positive joint pain, no fibromyalgia. No neck pain, back pain. Lymphadenopathy: No lymphadenopathy. Hematology: Positive anemia, No easy  bruising.   Blood pressure 140/66, pulse 81, temperature 98.3 F (36.8 C), temperature source Oral, resp. rate (!) 22, SpO2 100 %. There is no height or weight on file to calculate BMI. General appearance: alert, cooperative, appears stated age and no distress Head: Normocephalic, atraumatic. Eyes: Brown eyes, pale pink conjunctiva, corneas clear. PERRL, EOM's intact. Neck: No adenopathy, no carotid bruit, no JVD, supple, symmetrical, trachea midline and thyroid not enlarged. Resp: Clear to auscultation bilaterally. Cardio: Regular rate and rhythm, S1, S2 normal, II/VI systolic murmur, no click, rub or gallop GI: Soft, non-tender; bowel sounds normal; no organomegaly. Extremities: No edema, cyanosis or clubbing. Skin: Warm and dry.  Neurologic: Alert and oriented X 1, decreased strength.   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 Discontinue Diovan.  Birdie Riddle, MD  04/24/2017, 2:02 AM

## 2017-04-24 NOTE — ED Notes (Signed)
Roxanne Mins MD at bedside, family at bedside

## 2017-04-24 NOTE — ED Notes (Signed)
Date and time results received: 04/24/17  6:13 AM (use smartphrase ".now" to insert current time)  Test: Sodium Critical Value: 113  Name of Provider Notified: Dorene Ar NP with Cardiology  Orders Received? Or Actions Taken?: awaiting response

## 2017-04-24 NOTE — Progress Notes (Signed)
Inpatient Diabetes Program Recommendations  AACE/ADA: New Consensus Statement on Inpatient Glycemic Control (2015)  Target Ranges:  Prepandial:   less than 140 mg/dL      Peak postprandial:   less than 180 mg/dL (1-2 hours)      Critically ill patients:  140 - 180 mg/dL  Results for ALEEM, ELZA (MRN [id number]) as of 04/24/2017 14:27  Ref. Range 04/25/2017 23:59 04/24/2017 05:21  Glucose Latest Ref Range: 65 - 99 mg/dL 204 (H) 158 (H)  Results for BERGEN, MAGNER (MRN [id number]) as of 04/24/2017 14:27  Ref. Range 04/24/2017 00:08  Glucose-Capillary Latest Ref Range: 65 - 99 mg/dL 211 (H)   Review of Glycemic Control Outpatient DM medications: Lantus 20 units QHS Current orders for Inpatient glycemic control: Lantus 20 units QHS  Inpatient Diabetes Program Recommendations: Correction (SSI): Please consider ordering CBGs with Novolog 0-9 units TID with meals and Novolog 0-5 units QHS.  Thanks, Barnie Alderman, RN, MSN, CDE Diabetes Coordinator Inpatient Diabetes Program [phone] (Team Pager from 8am to 5pm)

## 2017-04-25 LAB — BASIC METABOLIC PANEL
ANION GAP: 8 (ref 5–15)
BUN: 7 mg/dL (ref 6–20)
CO2: 21 mmol/L — ABNORMAL LOW (ref 22–32)
Calcium: 8.1 mg/dL — ABNORMAL LOW (ref 8.9–10.3)
Chloride: 87 mmol/L — ABNORMAL LOW (ref 101–111)
Creatinine, Ser: 0.72 mg/dL (ref 0.61–1.24)
GFR calc Af Amer: >60 mL/min (ref >60)
Glucose, Bld: 118 mg/dL — ABNORMAL HIGH (ref 65–99)
POTASSIUM: 4.3 mmol/L (ref 3.5–5.1)
SODIUM: 116 mmol/L — AB (ref 135–145)

## 2017-04-25 LAB — GLUCOSE, CAPILLARY: GLUCOSE-CAPILLARY: 120 mg/dL — AB (ref 65–99)

## 2017-04-25 MED ORDER — INSULIN GLARGINE 100 UNIT/ML ~~LOC~~ SOLN
15.0000 [IU] | Freq: Every day | SUBCUTANEOUS | Status: DC
  Administered 2017-04-25: 15 [IU] via SUBCUTANEOUS
  Filled 2017-04-25: qty 0.15

## 2017-04-25 MED ORDER — AMLODIPINE BESYLATE 5 MG PO TABS
5.0000 mg | ORAL_TABLET | Freq: Every day | ORAL | Status: DC
Start: 2017-04-25 — End: 2017-04-26
  Administered 2017-04-25: 5 mg via ORAL
  Filled 2017-04-25: qty 1

## 2017-04-25 MED ORDER — MIRTAZAPINE 15 MG PO TBDP
15.0000 mg | ORAL_TABLET | Freq: Every day | ORAL | Status: DC
  Administered 2017-04-25 – 2017-04-28 (×3): 15 mg via ORAL
  Filled 2017-04-25 (×4): qty 1

## 2017-04-25 NOTE — Progress Notes (Signed)
Ref: Dixie Dials, MD   Subjective:  Poor appetite per patient. Blood sugars are near normal range. VS are stable. Hyponatremia continues with small improvement.  Objective:  Vital Signs in the last 24 hours: Temp:  [97.5 F (36.4 C)-98.2 F (36.8 C)] 97.5 F (36.4 C) (07/25 0540) Pulse Rate:  [71-81] 71 (07/25 0540) Cardiac Rhythm: Normal sinus rhythm;Heart block (07/25 0700) Resp:  [15-20] 19 (07/25 0540) BP: (142-177)/(68-76) 163/76 (07/25 0540) SpO2:  [97 %-100 %] 100 % (07/25 0540) Weight:  [70.3 kg (155 lb)] 70.3 kg (155 lb) (07/25 0540)  Physical Exam: BP Readings from Last 1 Encounters:  04/25/17 (!) 163/76    Wt Readings from Last 1 Encounters:  04/25/17 70.3 kg (155 lb)    Weight change:  Body mass index is 27.46 kg/m. HEENT: [place]/AT, Eyes-Brown, PERL, EOMI, Conjunctiva-Pink, Sclera-Non-icteric Neck: No JVD, No bruit, Trachea midline. Lungs:  Clear, Bilateral. Cardiac:  Regular rhythm, normal S1 and S2, no S3. II/VI systolic murmur. Abdomen:  Soft, non-tender. BS present. Extremities:  No edema present. No cyanosis. No clubbing. CNS: AxOx3, Cranial nerves grossly intact, moves all 4 extremities. Right sided weakness. Skin: Warm and dry.   Intake/Output from previous day: 07/24 0701 - 07/25 0700 In: 735 [I.V.:735] Out: 220 [Urine:220]    Lab Results: BMET    Component Value Date/Time   NA 116 (LL) 04/25/2017 0347   NA 113 (LL) 04/24/2017 0521   NA 110 (LL) 04/25/2017 2359   K 4.3 04/25/2017 0347   K 4.5 04/24/2017 0521   K 4.9 04/07/2017 2359   CL 87 (L) 04/25/2017 0347   CL 84 (L) 04/24/2017 0521   CL 79 (L) 04/05/2017 2359   CO2 21 (L) 04/25/2017 0347   CO2 21 (L) 04/24/2017 0521   CO2 21 (L) 04/09/2017 2359   GLUCOSE 118 (H) 04/25/2017 0347   GLUCOSE 158 (H) 04/24/2017 0521   GLUCOSE 204 (H) 04/29/2017 2359   BUN 7 04/25/2017 0347   BUN 8 04/24/2017 0521   BUN 9 04/26/2017 2359   CREATININE 0.72 04/25/2017 0347   CREATININE 0.69 04/24/2017  0521   CREATININE 0.75 04/05/2017 2359   CALCIUM 8.1 (L) 04/25/2017 0347   CALCIUM 8.4 (L) 04/24/2017 0521   CALCIUM 8.9 04/12/2017 2359   GFRNONAA >60 04/25/2017 0347   GFRNONAA >60 04/24/2017 0521   GFRNONAA >60 04/12/2017 2359   GFRAA >60 04/25/2017 0347   GFRAA >60 04/24/2017 0521   GFRAA >60 04/16/2017 2359   CBC    Component Value Date/Time   WBC 9.5 04/24/2017 0521   RBC 3.50 (L) 04/24/2017 0521   HGB 9.3 (L) 04/24/2017 0521   HCT 26.7 (L) 04/24/2017 0521   PLT 213 04/24/2017 0521   MCV 76.3 (L) 04/24/2017 0521   MCH 26.6 04/24/2017 0521   MCHC 34.8 04/24/2017 0521   RDW 16.8 (H) 04/24/2017 0521   LYMPHSABS 1.2 04/07/2017 1732   MONOABS 0.9 04/07/2017 1732   EOSABS 0.1 04/07/2017 1732   BASOSABS 0.0 04/07/2017 1732   HEPATIC Function Panel  Recent Labs  04/07/17 1732 04/08/17 0000 04/11/17 0730  PROT 7.3 7.0 7.5   HEMOGLOBIN A1C No components found for: HGA1C,  MPG CARDIAC ENZYMES Lab Results  Component Value Date   TROPONINI 4.14 ([facility]) 09/17/2013   BNP No results for input(s): PROBNP in the last 8760 hours. TSH  Recent Labs  04/08/17 0124  TSH 1.020   CHOLESTEROL  Recent Labs  08/31/16 1012 01/10/17 0849 04/08/17 0124  CHOL  250* 114 102    Scheduled Meds: . amLODipine  5 mg Oral Daily  . aspirin EC  81 mg Oral Daily  . atorvastatin  80 mg Oral Daily  . demeclocycline  150 mg Oral BID  . gabapentin  100 mg Oral TID  . heparin  5,000 Units Subcutaneous Q8H  . insulin glargine  15 Units Subcutaneous QHS  . isosorbide mononitrate  30 mg Oral Daily  . metoprolol tartrate  12.5 mg Oral BID   Continuous Infusions: . sodium chloride 100 mL/hr at 04/24/17 2047   PRN Meds:.nitroGLYCERIN, RESOURCE THICKENUP CLEAR  Assessment/Plan: SIADH with severe hyponatremia Generalized weakness CAD CABG Old [place] RCA stent in past Hypertension DM, II Hyperlipidemia Stroke with right hemiparesis Anemia of chronic disease Degenerative joint  disease  Continue saline infusion Encourage feeding. Temporarily decrease Lantus dose until oral intake improves. Add Mirtazapine to improve appetite. Add amlodipine for blood pressure.    LOS: 1 day    Dixie Dials  MD  04/25/2017, 8:22 AM

## 2017-04-25 NOTE — Progress Notes (Signed)
CRITICAL VALUE ALERT  Critical Value: Sodium 116  Date & Time Notied: 353  Provider Notified: Baltazar Najjar, NP at [phone]

## 2017-04-26 LAB — GLUCOSE, CAPILLARY
GLUCOSE-CAPILLARY: 130 mg/dL — AB (ref 65–99)
Glucose-Capillary: 88 mg/dL (ref 65–99)
Glucose-Capillary: 119 mg/dL — ABNORMAL HIGH (ref 65–99)
Glucose-Capillary: 117 mg/dL — ABNORMAL HIGH (ref 65–99)

## 2017-04-26 LAB — BASIC METABOLIC PANEL
Anion gap: 9 (ref 5–15)
BUN: 7 mg/dL (ref 6–20)
CALCIUM: 8.5 mg/dL — AB (ref 8.9–10.3)
CO2: 19 mmol/L — AB (ref 22–32)
CREATININE: 0.66 mg/dL (ref 0.61–1.24)
Chloride: 89 mmol/L — ABNORMAL LOW (ref 101–111)
GFR calc Af Amer: >60 mL/min (ref >60)
GFR calc non Af Amer: >60 mL/min (ref >60)
GLUCOSE: 93 mg/dL (ref 65–99)
Potassium: 4.1 mmol/L (ref 3.5–5.1)
Sodium: 117 mmol/L — CL (ref 135–145)

## 2017-04-26 MED ORDER — TOLVAPTAN 15 MG PO TABS
15.0000 mg | ORAL_TABLET | ORAL | Status: DC
  Administered 2017-04-26: 15 mg via ORAL
  Filled 2017-04-26 (×2): qty 1

## 2017-04-26 MED ORDER — PANTOPRAZOLE SODIUM 40 MG PO PACK
40.0000 mg | PACK | Freq: Every day | ORAL | Status: DC
  Administered 2017-04-26 – 2017-04-30 (×4): 40 mg
  Filled 2017-04-26 (×5): qty 20

## 2017-04-26 MED ORDER — LOSARTAN POTASSIUM 50 MG PO TABS
100.0000 mg | ORAL_TABLET | Freq: Every day | ORAL | Status: DC
  Administered 2017-04-26 – 2017-04-28 (×3): 100 mg via ORAL
  Filled 2017-04-26 (×3): qty 2

## 2017-04-26 MED ORDER — AMLODIPINE BESYLATE 5 MG PO TABS
5.0000 mg | ORAL_TABLET | Freq: Two times a day (BID) | ORAL | Status: DC
  Administered 2017-04-26 – 2017-04-28 (×4): 5 mg via ORAL
  Filled 2017-04-26 (×4): qty 1

## 2017-04-26 MED ORDER — INSULIN GLARGINE 100 UNIT/ML ~~LOC~~ SOLN
10.0000 [IU] | Freq: Every day | SUBCUTANEOUS | Status: DC
  Administered 2017-04-26 – 2017-05-01 (×5): 10 [IU] via SUBCUTANEOUS
  Filled 2017-04-26 (×5): qty 0.1

## 2017-04-26 MED ORDER — INSULIN ASPART 100 UNIT/ML ~~LOC~~ SOLN
0.0000 [IU] | Freq: Three times a day (TID) | SUBCUTANEOUS | Status: DC
  Administered 2017-04-27 (×2): 2 [IU] via SUBCUTANEOUS
  Administered 2017-04-27 – 2017-04-28 (×4): 3 [IU] via SUBCUTANEOUS
  Administered 2017-04-29: 11 [IU] via SUBCUTANEOUS
  Administered 2017-04-29 (×2): 8 [IU] via SUBCUTANEOUS
  Administered 2017-04-30 (×2): 5 [IU] via SUBCUTANEOUS
  Administered 2017-04-30: 3 [IU] via SUBCUTANEOUS
  Administered 2017-05-01: 15 [IU] via SUBCUTANEOUS
  Administered 2017-05-01: 5 [IU] via SUBCUTANEOUS

## 2017-04-26 MED ORDER — DEXTROSE-NACL 5-0.9 % IV SOLN
INTRAVENOUS | Status: DC
  Administered 2017-04-26 – 2017-04-30 (×3): via INTRAVENOUS

## 2017-04-26 MED ORDER — NYSTATIN 100000 UNIT/ML MT SUSP
5.0000 mL | Freq: Four times a day (QID) | OROMUCOSAL | Status: AC
  Administered 2017-04-26 – 2017-04-29 (×9): 500000 [IU] via ORAL
  Filled 2017-04-26 (×10): qty 5

## 2017-04-26 NOTE — Evaluation (Addendum)
Clinical/Bedside Swallow Evaluation Patient Details  Name: Calvin Perez MRN: [id number] Date of Birth: June 03, 1941  Today's Date: 04/26/2017 Time: SLP Start Time (ACUTE ONLY): 15 SLP Stop Time (ACUTE ONLY): 1112 SLP Time Calculation (min) (ACUTE ONLY): 20 min  Past Medical History:  Past Medical History:  Diagnosis Date  . Arthritis   . Coronary artery disease   . Diabetes mellitus    insulin dependent  . Dyslipidemia   . Hypertension    Past Surgical History:  Past Surgical History:  Procedure Laterality Date  . CARDIAC CATHETERIZATION    . CORONARY ARTERY BYPASS GRAFT    . FLEXIBLE SIGMOIDOSCOPY Left 04/13/2017   Procedure: FLEXIBLE SIGMOIDOSCOPY;  Surgeon: Arta Silence, MD;  Location: [facility] ENDOSCOPY;  Service: Endoscopy;  Laterality: Left;  . LEFT HEART CATHETERIZATION WITH CORONARY ANGIOGRAM N/A 09/15/2013   Procedure: LEFT HEART CATHETERIZATION WITH CORONARY ANGIOGRAM;  Surgeon: Clent Demark, MD;  Location: [place] CATH LAB;  Service: Cardiovascular;  Laterality: N/A;  . open heart surgery  1998  . PERCUTANEOUS STENT INTERVENTION  09/15/2013   Procedure: PERCUTANEOUS STENT INTERVENTION;  Surgeon: Clent Demark, MD;  Location: [place] CATH LAB;  Service: Cardiovascular;;   HPI:  76 year old male presented with weakness and confusion for 3 days.when he was admitted for SIADH (Syndrome of inappropriate antidiuretic hormone secretion. MBS 04/14/17 revealed penetration before the swallow and silent aspiration during (cued cough ineffective), intermittent trace penetration with nectar and honey, trace aspiration with nectar (compensatory strategies ineffective). Recommended regular and nectar thick liquids, free water protocol. C XR not repeated since 04/10/17 RIGHT basilar scarring without acute infiltrate.    Assessment / Plan / Recommendation Clinical Impression  Pt states he is unable to swallow (translated by son). Rn reports he consumed pills whole in applesauce this morning.  Pharyngeal dysphagia as evidenced during MBS 04/14/17. Delayed coughs today following thin and effortful swallow. Suspect esophageal involvement is possible. Family reports he has been drinking thin liquids since leaving hospital 2 weeks ago (water protocol was recommended). Recommend repeat MBS tomorrow and pt may benefit from a barium esophagram following MBS to assess esophagus. MD, please order if agree  SLP Visit Diagnosis: Dysphagia, unspecified (R13.10)    Aspiration Risk  Moderate aspiration risk    Diet Recommendation Nectar-thick liquid;Dysphagia 3 (Mech soft)   Liquid Administration via: Cup;No straw Medication Administration: Crushed with puree Supervision: Patient able to self feed;Full supervision/cueing for compensatory strategies Compensations: Slow rate;Small sips/bites Postural Changes: Seated upright at 90 degrees    Other  Recommendations Recommended Consults: Consider esophageal assessment Oral Care Recommendations: Oral care BID   Follow up Recommendations  (TBA)      Frequency and Duration min 2x/week  2 weeks       Prognosis Prognosis for Safe Diet Advancement:  (fair-good)      Swallow Study   General HPI: 76 year old male presented with weakness and confusion for 3 days.when he was admitted for SIADH (Syndrome of inappropriate antidiuretic hormone secretion. MBS 04/14/17 revealed penetration before the swallow and silent aspiration during (cued cough ineffective), intermittent trace penetration with nectar and honey, trace aspiration with nectar (compensatory strategies ineffective). Recommended regular and nectar thick liquids, free water protocol. C XR not repeated since 04/10/17 RIGHT basilar scarring without acute infiltrate.  Type of Study: Bedside Swallow Evaluation Previous Swallow Assessment: see HPI Diet Prior to this Study: Regular;Thin liquids (ordered thick it for room) Temperature Spikes Noted: No Respiratory Status: Room air History of Recent  Intubation: No Behavior/Cognition: Alert;Requires cueing  Oral Cavity Assessment:  (lingual thrush?) Oral Care Completed by SLP: No Oral Cavity - Dentition: Adequate natural dentition Vision: Functional for self-feeding Self-Feeding Abilities: Able to feed self Patient Positioning: Upright in bed Baseline Vocal Quality: Wet Volitional Cough: Weak Volitional Swallow: Unable to elicit    Oral/Motor/Sensory Function Overall Oral Motor/Sensory Function: Within functional limits   Ice Chips Ice chips: Not tested   Thin Liquid Thin Liquid: Impaired Presentation: Cup Pharyngeal  Phase Impairments: Cough - Delayed;Suspected delayed Swallow    Nectar Thick Nectar Thick Liquid: Not tested   Honey Thick Honey Thick Liquid: Not tested   Puree Puree: Impaired Pharyngeal Phase Impairments: Suspected delayed Swallow   Solid   GO   Solid:  (pt refused)        Houston Siren 04/26/2017,1:26 PM  [name] [place].Ed [employer] [phone]

## 2017-04-26 NOTE — Progress Notes (Signed)
Ref: Dixie Dials, MD   Subjective:  Feeling same. Nurse reported trouble swallowing at times. Poor oral intake. Hyponatremia continues and blood sugars are lower than expected.  Objective:  Vital Signs in the last 24 hours: Temp:  [97.5 F (36.4 C)-97.9 F (36.6 C)] 97.8 F (36.6 C) (07/26 6270) Pulse Rate:  [68-77] 72 (07/26 0636) Cardiac Rhythm: Normal sinus rhythm;Heart block (07/26 0700) Resp:  [18-20] 18 (07/26 0632) BP: (133-178)/(56-76) 170/74 (07/26 0636) SpO2:  [98 %-100 %] 100 % (07/26 3500)  Physical Exam: BP Readings from Last 1 Encounters:  04/26/17 (!) 170/74    Wt Readings from Last 1 Encounters:  04/25/17 70.3 kg (155 lb)    Weight change:  Body mass index is 27.46 kg/m. HEENT: [place]/AT, Eyes-Brown, PERL, EOMI, Conjunctiva-Pink, Sclera-Non-icteric Neck: No JVD, No bruit, Trachea midline. Lungs:  Clear, Bilateral. Cardiac:  Regular rhythm, normal S1 and S2, no S3. II/VI systolic murmur. Abdomen:  Soft, non-tender. BS present. Extremities:  No edema present. No cyanosis. No clubbing. CNS: AxOx3, Cranial nerves grossly intact, moves all 4 extremities. Right sided weakness. Skin: Warm and dry.   Intake/Output from previous day: No intake/output data recorded.    Lab Results: BMET    Component Value Date/Time   NA 117 (LL) 04/26/2017 0334   NA 116 (LL) 04/25/2017 0347   NA 113 (LL) 04/24/2017 0521   K 4.1 04/26/2017 0334   K 4.3 04/25/2017 0347   K 4.5 04/24/2017 0521   CL 89 (L) 04/26/2017 0334   CL 87 (L) 04/25/2017 0347   CL 84 (L) 04/24/2017 0521   CO2 19 (L) 04/26/2017 0334   CO2 21 (L) 04/25/2017 0347   CO2 21 (L) 04/24/2017 0521   GLUCOSE 93 04/26/2017 0334   GLUCOSE 118 (H) 04/25/2017 0347   GLUCOSE 158 (H) 04/24/2017 0521   BUN 7 04/26/2017 0334   BUN 7 04/25/2017 0347   BUN 8 04/24/2017 0521   CREATININE 0.66 04/26/2017 0334   CREATININE 0.72 04/25/2017 0347   CREATININE 0.69 04/24/2017 0521   CALCIUM 8.5 (L) 04/26/2017 0334   CALCIUM 8.1 (L) 04/25/2017 0347   CALCIUM 8.4 (L) 04/24/2017 0521   GFRNONAA >60 04/26/2017 0334   GFRNONAA >60 04/25/2017 0347   GFRNONAA >60 04/24/2017 0521   GFRAA >60 04/26/2017 0334   GFRAA >60 04/25/2017 0347   GFRAA >60 04/24/2017 0521   CBC    Component Value Date/Time   WBC 9.5 04/24/2017 0521   RBC 3.50 (L) 04/24/2017 0521   HGB 9.3 (L) 04/24/2017 0521   HCT 26.7 (L) 04/24/2017 0521   PLT 213 04/24/2017 0521   MCV 76.3 (L) 04/24/2017 0521   MCH 26.6 04/24/2017 0521   MCHC 34.8 04/24/2017 0521   RDW 16.8 (H) 04/24/2017 0521   LYMPHSABS 1.2 04/07/2017 1732   MONOABS 0.9 04/07/2017 1732   EOSABS 0.1 04/07/2017 1732   BASOSABS 0.0 04/07/2017 1732   HEPATIC Function Panel  Recent Labs  04/07/17 1732 04/08/17 0000 04/11/17 0730  PROT 7.3 7.0 7.5   HEMOGLOBIN A1C No components found for: HGA1C,  MPG CARDIAC ENZYMES Lab Results  Component Value Date   TROPONINI 4.14 ([place]) 09/17/2013   BNP No results for input(s): PROBNP in the last 8760 hours. TSH  Recent Labs  04/08/17 0124  TSH 1.020   CHOLESTEROL  Recent Labs  08/31/16 1012 01/10/17 0849 04/08/17 0124  CHOL 250* 114 102    Scheduled Meds: . amLODipine  5 mg Oral BID  . aspirin  EC  81 mg Oral Daily  . atorvastatin  80 mg Oral Daily  . gabapentin  100 mg Oral TID  . heparin  5,000 Units Subcutaneous Q8H  . insulin glargine  10 Units Subcutaneous QHS  . isosorbide mononitrate  30 mg Oral Daily  . metoprolol tartrate  12.5 mg Oral BID  . mirtazapine  15 mg Oral QHS  . tolvaptan  15 mg Oral Q24H   Continuous Infusions: . sodium chloride 100 mL/hr at 04/26/17 0400  . dextrose 5 % and 0.9% NaCl     PRN Meds:.nitroGLYCERIN, RESOURCE THICKENUP CLEAR  Assessment/Plan: SIADH with Severe hyponatremia Poor response to demeclocycline CAD CABG Old MI RCA stent in past  Stroke with right hemiparesis Difficulty swallowing Hypertension Type 2 diabetes mellitus Hyperlipidemia Anemia of  chronic disease Degenerative joint disease  We will try Samsca 15 mg daily. Swallow study. Decrease insulin dose further and add 25 mL of D5 normal saline to current IV.    LOS: 2 days    Dixie Dials  MD  04/26/2017, 9:10 AM

## 2017-04-27 ENCOUNTER — Inpatient Hospital Stay (HOSPITAL_COMMUNITY): Payer: Medicare HMO

## 2017-04-27 LAB — BASIC METABOLIC PANEL
Anion gap: 9 (ref 5–15)
Anion gap: 7 (ref 5–15)
BUN: 6 mg/dL (ref 6–20)
BUN: 6 mg/dL (ref 6–20)
CALCIUM: 9.1 mg/dL (ref 8.9–10.3)
CHLORIDE: 106 mmol/L (ref 101–111)
CO2: 21 mmol/L — ABNORMAL LOW (ref 22–32)
CO2: 23 mmol/L (ref 22–32)
CREATININE: 0.89 mg/dL (ref 0.61–1.24)
Calcium: 9.1 mg/dL (ref 8.9–10.3)
Chloride: 103 mmol/L (ref 101–111)
Creatinine, Ser: 0.84 mg/dL (ref 0.61–1.24)
GFR calc Af Amer: >60 mL/min (ref >60)
Glucose, Bld: 135 mg/dL — ABNORMAL HIGH (ref 65–99)
Glucose, Bld: 149 mg/dL — ABNORMAL HIGH (ref 65–99)
POTASSIUM: 5 mmol/L (ref 3.5–5.1)
Potassium: 4.1 mmol/L (ref 3.5–5.1)
SODIUM: 133 mmol/L — AB (ref 135–145)
SODIUM: 136 mmol/L (ref 135–145)

## 2017-04-27 LAB — GLUCOSE, CAPILLARY
GLUCOSE-CAPILLARY: 143 mg/dL — AB (ref 65–99)
GLUCOSE-CAPILLARY: 142 mg/dL — AB (ref 65–99)
GLUCOSE-CAPILLARY: 154 mg/dL — AB (ref 65–99)
GLUCOSE-CAPILLARY: 131 mg/dL — AB (ref 65–99)

## 2017-04-27 MED ORDER — JEVITY 1.2 CAL PO LIQD
1000.0000 mL | ORAL | Status: DC
  Administered 2017-04-28: 25 mL/h
  Administered 2017-04-29 – 2017-05-01 (×3): 1000 mL
  Filled 2017-04-27 (×7): qty 1000

## 2017-04-27 MED ORDER — TOLVAPTAN 15 MG PO TABS
15.0000 mg | ORAL_TABLET | ORAL | Status: DC
  Filled 2017-04-27: qty 1

## 2017-04-27 MED ORDER — JEVITY 1.2 CAL PO LIQD
1000.0000 mL | ORAL | Status: DC
  Filled 2017-04-27: qty 1000

## 2017-04-27 MED ORDER — GABAPENTIN 100 MG PO CAPS
100.0000 mg | ORAL_CAPSULE | Freq: Every day | ORAL | Status: DC
  Administered 2017-04-28: 100 mg via ORAL
  Filled 2017-04-27: qty 1

## 2017-04-27 NOTE — Progress Notes (Signed)
  Speech Language Pathology Treatment: Dysphagia  Patient Details Name: Calvin Perez MRN: [id number] DOB: May 15, 1941 Today's Date: 04/27/2017 Time: 2902-1115 SLP Time Calculation (min) (ACUTE ONLY): 16 min  Assessment / Plan / Recommendation Clinical Impression  Education provided to pt/son/wife re: results of MBS, with video playback of study and recommendations. Answered questions from son. Updated on plan: SLP spoke with Dr. Doylene Canard re: results who will consult GI. ST will follow.     HPI HPI: 76 year old male presented with weakness and confusion for 3 days.when he was admitted for SIADH (Syndrome of inappropriate antidiuretic hormone secretion. MBS 04/14/17 revealed penetration before the swallow and silent aspiration during (cued cough ineffective), intermittent trace penetration with nectar and honey, trace aspiration with nectar (compensatory strategies ineffective). Recommended regular and nectar thick liquids, free water protocol. C XR not repeated since 04/10/17 RIGHT basilar scarring without acute infiltrate.       SLP Plan  Continue with current plan of care       Recommendations  Diet recommendations: Nectar-thick liquid;Dysphagia 3 (mechanical soft) Liquids provided via: Cup;No straw Medication Administration: Crushed with puree Supervision: Patient able to self feed;Full supervision/cueing for compensatory strategies Compensations: Slow rate;Small sips/bites Postural Changes and/or Swallow Maneuvers: Seated upright 90 degrees;Upright 30-60 min after meal                Oral Care Recommendations: Oral care BID Follow up Recommendations: Home health SLP SLP Visit Diagnosis: Dysphagia, pharyngoesophageal phase (R13.14) Plan: Continue with current plan of care       [name], Calvin Perez 04/27/2017, 3:51 PM  Calvin Perez Calvin Perez M.Ed [employer] [phone]

## 2017-04-27 NOTE — Progress Notes (Signed)
Paged [facility] team.

## 2017-04-27 NOTE — Progress Notes (Signed)
PT Cancellation Note  Patient Details Name: Calvin Perez MRN: [id number] DOB: 1940/10/31   Cancelled Treatment:    Reason Eval/Treat Not Completed: Medical issues which prohibited therapy. Pt is still very weak and dry heaving. Family requests PT wait until tomorrow to check back. Will check back tomorrow.    Scheryl Marten PT, DPT  [phone]  04/27/2017, 12:23 PM

## 2017-04-27 NOTE — Progress Notes (Signed)
Ref: Dixie Dials, MD   Subjective:  Sleeping. Improved sodium. VS stable. Poor oral intake per nurse.   Objective:  Vital Signs in the last 24 hours: Temp:  [97.8 F (36.6 C)-98.4 F (36.9 C)] 98.1 F (36.7 C) (07/27 0553) Pulse Rate:  [78-98] 84 (07/27 0553) Resp:  [16-18] 18 (07/27 0553) BP: (140-174)/(70-84) 159/70 (07/27 0553) SpO2:  [97 %-98 %] 97 % (07/27 0553) Weight:  [79.8 kg (176 lb)] 79.8 kg (176 lb) (07/27 0500)  Physical Exam: BP Readings from Last 1 Encounters:  04/27/17 (!) 159/70    Wt Readings from Last 1 Encounters:  04/27/17 79.8 kg (176 lb)    Weight change:  Body mass index is 31.18 kg/m. HEENT: [place]/AT, Eyes-Brown, PERL, EOMI, Conjunctiva-Pale pink, Sclera-Non-icteric Neck: No JVD, No bruit, Trachea midline. Lungs:  Clear, Bilateral. Cardiac:  Regular rhythm, normal S1 and S2, no S3. II/VI systolic murmur. Abdomen:  Soft, non-tender. BS present. Extremities:  No edema present. No cyanosis. No clubbing. CNS: AxOx3, Cranial nerves grossly intact, moves all 4 extremities.  Skin: Warm and dry.   Intake/Output from previous day: 07/26 0701 - 07/27 0700 In: 5297.9 [I.V.:5297.9] Out: 3825 [Urine:3825]    Lab Results: BMET    Component Value Date/Time   NA 136 04/27/2017 0554   NA 133 (L) 04/27/2017 0323   NA 117 (LL) 04/26/2017 0334   K 4.1 04/27/2017 0554   K 5.0 04/27/2017 0323   K 4.1 04/26/2017 0334   CL 106 04/27/2017 0554   CL 103 04/27/2017 0323   CL 89 (L) 04/26/2017 0334   CO2 23 04/27/2017 0554   CO2 21 (L) 04/27/2017 0323   CO2 19 (L) 04/26/2017 0334   GLUCOSE 149 (H) 04/27/2017 0554   GLUCOSE 135 (H) 04/27/2017 0323   GLUCOSE 93 04/26/2017 0334   BUN 6 04/27/2017 0554   BUN 6 04/27/2017 0323   BUN 7 04/26/2017 0334   CREATININE 0.89 04/27/2017 0554   CREATININE 0.84 04/27/2017 0323   CREATININE 0.66 04/26/2017 0334   CALCIUM 9.1 04/27/2017 0554   CALCIUM 9.1 04/27/2017 0323   CALCIUM 8.5 (L) 04/26/2017 0334   GFRNONAA  >60 04/27/2017 0554   GFRNONAA >60 04/27/2017 0323   GFRNONAA >60 04/26/2017 0334   GFRAA >60 04/27/2017 0554   GFRAA >60 04/27/2017 0323   GFRAA >60 04/26/2017 0334   CBC    Component Value Date/Time   WBC 9.5 04/24/2017 0521   RBC 3.50 (L) 04/24/2017 0521   HGB 9.3 (L) 04/24/2017 0521   HCT 26.7 (L) 04/24/2017 0521   PLT 213 04/24/2017 0521   MCV 76.3 (L) 04/24/2017 0521   MCH 26.6 04/24/2017 0521   MCHC 34.8 04/24/2017 0521   RDW 16.8 (H) 04/24/2017 0521   LYMPHSABS 1.2 04/07/2017 1732   MONOABS 0.9 04/07/2017 1732   EOSABS 0.1 04/07/2017 1732   BASOSABS 0.0 04/07/2017 1732   HEPATIC Function Panel  Recent Labs  04/07/17 1732 04/08/17 0000 04/11/17 0730  PROT 7.3 7.0 7.5   HEMOGLOBIN A1C No components found for: HGA1C,  MPG CARDIAC ENZYMES Lab Results  Component Value Date   TROPONINI 4.14 ([place]) 09/17/2013   BNP No results for input(s): PROBNP in the last 8760 hours. TSH  Recent Labs  04/08/17 0124  TSH 1.020   CHOLESTEROL  Recent Labs  08/31/16 1012 01/10/17 0849 04/08/17 0124  CHOL 250* 114 102    Scheduled Meds: . amLODipine  5 mg Oral BID  . aspirin EC  81 mg  Oral Daily  . atorvastatin  80 mg Oral Daily  . gabapentin  100 mg Oral TID  . heparin  5,000 Units Subcutaneous Q8H  . insulin aspart  0-15 Units Subcutaneous TID WC  . insulin glargine  10 Units Subcutaneous QHS  . isosorbide mononitrate  30 mg Oral Daily  . losartan  100 mg Oral Daily  . metoprolol tartrate  12.5 mg Oral BID  . mirtazapine  15 mg Oral QHS  . nystatin  5 mL Oral QID  . pantoprazole sodium  40 mg Per Tube Q1200  . [START ON 04/28/2017] tolvaptan  15 mg Oral Q48H   Continuous Infusions: . sodium chloride 75 mL/hr at 04/27/17 0604  . dextrose 5 % and 0.9% NaCl 25 mL/hr at 04/26/17 0953  . feeding supplement (JEVITY 1.2 CAL)     PRN Meds:.nitroGLYCERIN, RESOURCE THICKENUP CLEAR  Assessment/Plan: SIADH-stable Hyponatremia-corrected CAD CABG Old MI H/O RCA  stent Stroke with right sided weakness Dysphagia Hypertension Type II DM, II Anemia of chronic disease Protein calorie malnutrition, moderate  Start tube feeding. MRI/MRA brain r/o stroke Increase activity. Decrease Tolvaptan dose and gabapentin dose.    LOS: 3 days    Dixie Dials  MD  04/27/2017, 9:43 AM

## 2017-04-27 NOTE — Progress Notes (Signed)
Modified Barium Swallow Progress Note  Patient Details  Name: Calvin Perez MRN: [id number] Date of Birth: 05-29-1941  Today's Date: 04/27/2017  Modified Barium Swallow completed.  Full report located under Chart Review in the Imaging Section.  Brief recommendations include the following:  Clinical Impression    Results similar to [facility] 04/14/17 with addition of esophageal involvement noted during today's study. Decreased oral cohesion and control resulting in premature spill into laryngeal vestibule and aspiration before the swallow of thin barium producing with mild-moderate reflexive cough. Prolonged mastication of solid falling to vallecule and pt stating "I cannot swallow" and he would not attempt with multiple verbal cues/encouragement. Subseqeunt bolus of puree given, initiating swallow and transiting solid vallecular residue and puree mixture through UES. Esophageal scan revealed esophagus filled with barium residual (did not appear to have effective peristalsis- MBS does not diagnose below the UES). Nectar thick barium offered for possbile improved esophageal transit with adament refusal and SLP giving max encouragement/explanations. Suspect main reason for his po refusal and statements of being unable to swallow. Recommend continue Dys 3 (pt would refuse puree according to son) and nectar thick. Will contact MD re: GI consult to further investigate esophageal function. Consider short term alternate feeding for nutrition. ST will follow.    Swallow Evaluation Recommendations   Recommended Consults: Consider GI evaluation;Consider esophageal assessment   SLP Diet Recommendations: Nectar thick liquid;Dysphagia 3 (Mech soft) solids   Liquid Administration via: Cup;No straw   Medication Administration: Crushed with puree   Supervision: Patient able to self feed;Full supervision/cueing for compensatory strategies   Compensations: Slow rate;Small sips/bites;Follow solids with liquid;Clear  throat intermittently   Postural Changes: Remain semi-upright after after feeds/meals (Comment);Seated upright at 90 degrees   Oral Care Recommendations: Oral care BID        [place] Siren 04/27/2017,3:26 PM   [name] Colvin Caroli.Ed [employer] [phone]

## 2017-04-27 NOTE — Progress Notes (Addendum)
Initial Nutrition Assessment  DOCUMENTATION CODES:   Obesity unspecified  INTERVENTION:   -Once tube is placed:  Initiate Jevity 1.2 @ 25 ml/hr via cortrak tube and increase by 10 ml every 4 hours to goal rate of 65 ml/hr.   Tube feeding regimen provides 1872 kcal (100% of needs), 87 grams of protein, and 1259 ml of H2O.   -Snacks (Magic Cup) TID between meals  NUTRITION DIAGNOSIS:   Inadequate oral intake related to dysphagia, altered GI function as evidenced by meal completion < 25% (plans to start TF).  GOAL:   Patient will meet greater than or equal to 90% of their needs  MONITOR:   Labs, Weight trends, TF tolerance, Skin, I & O's  REASON FOR ASSESSMENT:   Malnutrition Screening Tool    ASSESSMENT:   76 year old male presented with weakness and confusion for 3 days. Patient had similar symptoms about 2 weeks ago when he was admitted for SIADH. He had nausea vomiting decreased appetite and recurrent falls. He had a fall in the bathroom without loss of consciousness and has been refusing to walk and needing an increased amount of assistance. He does not have fever or cough.  Case discussed with extensively with SLP; MBSS completed today. Pt was on nectar thick liquids PTA, however suspected noncompliance. Per SLP, food and liquid bolus appear to get stuck in the esophagus, causing pt extreme discomfort (possibly due to ?limited peristalsis). GI has been consulted for work-up.   Case discussed with both RN and cortrak tube team- plan to hold off on TF and tube placement until barium swallow is completed.   Spoke with pt son at bedside. Pt generally has a good appetite, however, intake has been decreased over the past 2 weeks related to dysphagia with liquids. Son suspects wt loss; UBW around 160#. Per wt hx, however, wt has been stable over the past year.   Nutrition-Focused physical exam completed. Findings are no fat depletion, no muscle depletion, and no edema.    Discussed with pt son rationale for TF placement and how pt will receive nutrition once it is placed.   Noted TF orders for Jevity 1.2 @ 30 ml/r (provides 864 kcals, 43 grams protein, and 581 ml free water daily, meeting 49% of estimated kcal needs 51% of estimated protein needs). Called and discussed case with Dr. Doylene Canard; receive verbal order to manage TF.   Labs reviewed: CBGS: 142-143.   Diet Order:  DIET DYS 3 Room service appropriate? Yes; Fluid consistency: Nectar Thick  Skin:  Reviewed, no issues  Last BM:  04/26/17  Height:   Ht Readings from Last 1 Encounters:  04/25/17 5\' 3"  (1.6 m)    Weight:   Wt Readings from Last 1 Encounters:  04/27/17 176 lb (79.8 kg)    Ideal Body Weight:  56.4 kg  BMI:  Body mass index is 31.18 kg/m.  Estimated Nutritional Needs:   Kcal:  4825-0037  Protein:  85-100 grams  Fluid:  1.7-1.9 L  EDUCATION NEEDS:   Education needs addressed  Vianna Venezia A. Jimmye Norman, RD, LDN, CDE Pager: [phone] After hours Pager: [phone]

## 2017-04-27 NOTE — Plan of Care (Signed)
Problem: Safety: Goal: Ability to remain free from injury will improve Outcome: Progressing No falls so far this shift; pt in low bed.

## 2017-04-27 NOTE — Progress Notes (Signed)
Per Dr. Alessandra Bevels, hold off on placing Cortrak tube at this time. Requesting to speak with SLP about MBS first.

## 2017-04-27 NOTE — Consult Note (Addendum)
Referring Provider:  Dr. Doylene Canard Primary Care Physician:  Dixie Dials, MD Primary Gastroenterologist:  Althia Forts  Reason for Consultation:  Dysphagia  HPI: Calvin Perez is a 76 y.o. 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 Patient with recent admission for SIADH. Patient was found to have significant hyponatremia with the NA of 110. Patient was seen by speech therapy evaluation and modified barium swallow performed. There were concern for possible esophageal cause for his dysphagia and GI is consulted for further evaluation  Asian seen and examined at bedside. Wife and son available to help with the history taking. According to wife, he was diagnosed with a stroke 3 years ago while in [place]. 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 Denied any issue once food goes down to the esophagus. He denied abdominal pain. Complaining of constipation. Denied diarrhea. Denied blood in the stool or black stool. He has lost some weight recently according to son.  Flexible sigmoidoscopy on 04/13/2017 by Dr. Paulita Fujita for evaluation of abnormal CT scan which was unremarkable. No previous colonoscopy. No previous EGD  Past Medical History:  Diagnosis Date  . Arthritis   . Coronary artery disease   . Diabetes mellitus    insulin dependent  . Dyslipidemia   . Hypertension     Past Surgical History:  Procedure Laterality Date  . CARDIAC CATHETERIZATION    . CORONARY ARTERY BYPASS GRAFT    . FLEXIBLE SIGMOIDOSCOPY Left 04/13/2017   Procedure: FLEXIBLE SIGMOIDOSCOPY;  Surgeon: Arta Silence, MD;  Location: [facility] ENDOSCOPY;  Service: Endoscopy;  Laterality: Left;  . LEFT HEART CATHETERIZATION WITH CORONARY ANGIOGRAM N/A 09/15/2013   Procedure: LEFT HEART CATHETERIZATION WITH CORONARY ANGIOGRAM;  Surgeon: Clent Demark, MD;   Location: [place] CATH LAB;  Service: Cardiovascular;  Laterality: N/A;  . open heart surgery  1998  . PERCUTANEOUS STENT INTERVENTION  09/15/2013   Procedure: PERCUTANEOUS STENT INTERVENTION;  Surgeon: Clent Demark, MD;  Location: [place] CATH LAB;  Service: Cardiovascular;;    Prior to Admission medications   Medication Sig Start Date End Date Taking? Authorizing Provider  aspirin 81 MG tablet Take 81 mg by mouth daily.   Yes [provider]  atorvastatin (LIPITOR) 80 MG tablet Take 1 tablet (80 mg total) by mouth daily. 11/05/14  Yes Elayne Snare, MD  colesevelam [facility]) 625 MG tablet Take 3 tablets twice a day Patient taking differently: Take 1,875 mg by mouth 2 (two) times daily with a meal.  12/15/13  Yes Elayne Snare, MD  demeclocycline (DECLOMYCIN) 150 MG tablet Take 1 tablet (150 mg total) by mouth 2 (two) times daily. 04/14/17  Yes Dixie Dials, MD  feeding supplement, GLUCERNA SHAKE, (GLUCERNA SHAKE) LIQD Take 237 mLs by mouth 2 (two) times daily between meals. 04/14/17  Yes Dixie Dials, MD  gabapentin (NEURONTIN) 100 MG capsule Take 1 capsule (100 mg total) by mouth 3 (three) times daily. 04/14/17  Yes Dixie Dials, MD  insulin glargine (LANTUS) 100 UNIT/ML injection Inject 0.2 mLs (20 Units total) into the skin at bedtime. 04/14/17  Yes Dixie Dials, MD  isosorbide mononitrate (IMDUR) 30 MG 24 hr tablet Take 1 tablet (30 mg total) by mouth daily. 09/18/13  Yes Dixie Dials, MD  KLOR-CON 10 10 MEQ tablet Take 1 tablet (10 mEq total) by mouth every Monday, Wednesday, and Friday. 04/16/17  Yes Dixie Dials, MD  metoprolol tartrate (LOPRESSOR) 12.5 mg TABS tablet Take 0.5 tablets (12.5 mg total) by mouth 2 (two) times daily. 09/18/13  Yes Dixie Dials, MD  naproxen sodium (ANAPROX) 220 MG tablet Take 440 mg by mouth daily as needed (for pain).   Yes [provider]  valsartan (DIOVAN) 80 MG tablet Take 1 tablet (80 mg total) by mouth daily. 04/14/17  Yes Dixie Dials, MD   ACCU-CHEK AVIVA PLUS test strip USE AS DIRECTED 3 TIMES A DAY Patient not taking: Reported on 04/09/2017 04/10/16   Elayne Snare, MD  food thickener (THICK IT) POWD Take 5 g by mouth as needed (with all liquids). 04/14/17   Dixie Dials, MD  nitroGLYCERIN (NITROSTAT) 0.4 MG SL tablet Place 1 tablet (0.4 mg total) under the tongue every 5 (five) minutes x 3 doses as needed for chest pain. 09/18/13   Dixie Dials, MD    Scheduled Meds: . amLODipine  5 mg Oral BID  . aspirin EC  81 mg Oral Daily  . atorvastatin  80 mg Oral Daily  . gabapentin  100 mg Oral QHS  . heparin  5,000 Units Subcutaneous Q8H  . insulin aspart  0-15 Units Subcutaneous TID WC  . insulin glargine  10 Units Subcutaneous QHS  . isosorbide mononitrate  30 mg Oral Daily  . losartan  100 mg Oral Daily  . metoprolol tartrate  12.5 mg Oral BID  . mirtazapine  15 mg Oral QHS  . nystatin  5 mL Oral QID  . pantoprazole sodium  40 mg Per Tube Q1200  . [START ON 04/28/2017] tolvaptan  15 mg Oral Q48H   Continuous Infusions: . sodium chloride 40 mL/hr at 04/27/17 1203  . dextrose 5 % and 0.9% NaCl 25 mL/hr at 04/26/17 0953  . feeding supplement (JEVITY 1.2 CAL)     PRN Meds:.nitroGLYCERIN, RESOURCE THICKENUP CLEAR  Allergies as of 04/28/2017  . (No Known Allergies)    No family history on file.  Social History   Social History  . Marital status: Married    Spouse name: sumitra  . Number of children: 2  . Years of education: 12   Occupational History  . retired    Social History Main Topics  . Smoking status: Never Smoker  . Smokeless tobacco: Never Used  . Alcohol use No  . Drug use: No  . Sexual activity: Not Currently   Other Topics Concern  . Not on file   Social History Narrative  . No narrative on file    Review of Systems: Review of Systems  Constitutional: Positive for malaise/fatigue. Negative for chills and fever.  HENT: Negative for hearing loss and tinnitus.   Eyes: Negative for blurred  vision and double vision.  Respiratory: Positive for cough and shortness of breath. Negative for hemoptysis.   Cardiovascular: Negative for chest pain and palpitations.  Gastrointestinal: Positive for constipation, nausea and vomiting. Negative for blood in stool, diarrhea and melena.  Genitourinary: Negative for dysuria and urgency.  Musculoskeletal: Positive for falls and myalgias. Negative for joint pain.  Skin: Negative for itching and rash.  Neurological: Positive for dizziness, speech change and weakness. Negative for seizures.  Endo/Heme/Allergies: Does not bruise/bleed easily.  Psychiatric/Behavioral: Negative for hallucinations and substance abuse.    Physical Exam: Vital signs: Vitals:   04/26/17 2149 04/27/17 0553  BP: (!) 169/84 (!) 159/70  Pulse: 98 84  Resp: 18 18  Temp: 97.8 F (36.6 C) 98.1 F (36.7 C)   Last BM Date: 04/26/17  Physical Exam  Constitutional: He appears well-developed. No distress.  HENT:  Head: Normocephalic and atraumatic.  Mouth/Throat: Oropharynx is clear and moist.  Eyes: EOM are normal. No scleral icterus.  Neck: Normal range of motion. Neck supple.  Cardiovascular: Normal rate, regular rhythm and normal heart sounds.   Pulmonary/Chest: Effort normal and breath sounds normal. No respiratory distress.  Abdominal: Soft. Bowel sounds are normal. He exhibits no distension. There is no tenderness. There is no rebound and no guarding.  Musculoskeletal: He exhibits no edema.  Pulses present  Neurological: He is alert.  Skin: Skin is warm. No erythema.  Psychiatric: He has a normal mood and affect. His behavior is normal.  Vitals reviewed.   GI:  Lab Results: No results for input(s): WBC, HGB, HCT, PLT in the last 72 hours. BMET  Recent Labs  04/26/17 0334 04/27/17 0323 04/27/17 0554  NA 117* 133* 136  K 4.1 5.0 4.1  CL 89* 103 106  CO2 19* 21* 23  GLUCOSE 93 135* 149*  BUN 7 6 6   CREATININE 0.66 0.84 0.89  CALCIUM 8.5* 9.1 9.1    LFT No results for input(s): PROT, ALBUMIN, AST, ALT, ALKPHOS, BILITOT, BILIDIR, IBILI in the last 72 hours. PT/INR No results for input(s): LABPROT, INR in the last 72 hours.   Studies/Results: [name] GG Contrast  Result Date: 04/27/2017 CLINICAL DATA:  Weakness and confusion for 3 days. Right leg weakness. EXAM: MRI HEAD WITHOUT CONTRAST MRA HEAD WITHOUT CONTRAST TECHNIQUE: Multiplanar, multiecho pulse sequences of the brain and surrounding structures were obtained without intravenous contrast. Angiographic images of the head were obtained using MRA technique without contrast. COMPARISON:  CT of the head and cervical spine 04/24/2017 hand 04/07/2017. FINDINGS: MRI HEAD FINDINGS Brain: A remote infarct involving the inferior posterior left occipital lobe is again seen. No acute infarct, hemorrhage, or mass lesion is present. Moderate generalized atrophy and white matter disease is noted bilaterally. Remote lacunar infarcts are present within the thalami bilaterally. Dilated perivascular spaces are present within the basal ganglia. No significant extra-axial fluid collection is present. Prominent T2 signal is noted anteriorly in the pons bilaterally, worse on the left. Cerebellar atrophy is noted. Vascular: Flow is present in the major intracranial arteries. Skull and upper cervical spine: The skullbase is within normal limits. The craniocervical junction is normal. Moderate central canal stenosis is noted at C3-4 with progression. Midline sagittal structures are otherwise unremarkable. There is diffuse thinning of the corpus callosum. Sinuses/Orbits: Bilateral mastoid effusions are present. The paranasal sinuses are clear. Bilateral lens replacements are noted. Globes and orbits are otherwise within normal limits. MRA HEAD FINDINGS Internal carotid arteries are within normal limits from the high cervical segments through the ICA termini bilaterally. The A1 and M1 segments are normal. Anterior  communicating artery is patent. The MCA bifurcations are intact bilaterally. ACA and MCA branch vessels are within normal limits. The vertebral arteries are codominant. The basilar artery is normal. Both posterior cerebral arteries originate from basilar tip. Tandem moderate stenoses are present within the right P2 segment. Branch vessels demonstrate flow bilaterally with moderate distal attenuation. IMPRESSION: 1. No acute intracranial abnormality. 2. Advanced atrophy and white matter disease likely reflects the sequela of chronic microvascular ischemia. 3. Remote infarct in the anterior pons. 4. Remote infarcts involving the bowel my bilaterally. 5. MRA demonstrates moderate right P2 segment stenoses can't disproportionate branch vessel disease in the PCA circulation compared to the anterior circulation. 6. No significant proximal stenosis, aneurysm, or branch  vessel occlusion within the anterior circulation. Electronically Signed   By: San Morelle M.D.   On: 04/27/2017 12:40   [name] Contrast  Result Date: 04/27/2017 CLINICAL DATA:  Weakness and confusion for 3 days. Right leg weakness. EXAM: MRI HEAD WITHOUT CONTRAST MRA HEAD WITHOUT CONTRAST TECHNIQUE: Multiplanar, multiecho pulse sequences of the brain and surrounding structures were obtained without intravenous contrast. Angiographic images of the head were obtained using MRA technique without contrast. COMPARISON:  CT of the head and cervical spine 04/24/2017 hand 04/07/2017. FINDINGS: MRI HEAD FINDINGS Brain: A remote infarct involving the inferior posterior left occipital lobe is again seen. No acute infarct, hemorrhage, or mass lesion is present. Moderate generalized atrophy and white matter disease is noted bilaterally. Remote lacunar infarcts are present within the thalami bilaterally. Dilated perivascular spaces are present within the basal ganglia. No significant extra-axial fluid collection is present. Prominent T2 signal is noted  anteriorly in the pons bilaterally, worse on the left. Cerebellar atrophy is noted. Vascular: Flow is present in the major intracranial arteries. Skull and upper cervical spine: The skullbase is within normal limits. The craniocervical junction is normal. Moderate central canal stenosis is noted at C3-4 with progression. Midline sagittal structures are otherwise unremarkable. There is diffuse thinning of the corpus callosum. Sinuses/Orbits: Bilateral mastoid effusions are present. The paranasal sinuses are clear. Bilateral lens replacements are noted. Globes and orbits are otherwise within normal limits. MRA HEAD FINDINGS Internal carotid arteries are within normal limits from the high cervical segments through the ICA termini bilaterally. The A1 and M1 segments are normal. Anterior communicating artery is patent. The MCA bifurcations are intact bilaterally. ACA and MCA branch vessels are within normal limits. The vertebral arteries are codominant. The basilar artery is normal. Both posterior cerebral arteries originate from basilar tip. Tandem moderate stenoses are present within the right P2 segment. Branch vessels demonstrate flow bilaterally with moderate distal attenuation. IMPRESSION: 1. No acute intracranial abnormality. 2. Advanced atrophy and white matter disease likely reflects the sequela of chronic microvascular ischemia. 3. Remote infarct in the anterior pons. 4. Remote infarcts involving the bowel my bilaterally. 5. MRA demonstrates moderate right P2 segment stenoses can't disproportionate branch vessel disease in the PCA circulation compared to the anterior circulation. 6. No significant proximal stenosis, aneurysm, or branch vessel occlusion within the anterior circulation. Electronically Signed   By: San Morelle M.D.   On: 04/27/2017 12:40    Impression/Plan: - Dysphagia and poor oral intake. 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 Resolved - Weight loss - etiology unknown - Abnormal CT scan on 04/10/2017 showing multiple low-density lesions scattered throughout the liver suspicious for hemangioma but could not completely characterize - Abnormal LFTs.  Recommendations -------------------------- - Barium swallow for further evaluation.  -  MRI of the liver for further evaluation of abnormal liver lesions. Repeat LFTs tomorrow. Check Hepatitis B surface antigen and hepatitis C antibody - continue  diet based on speech therapy recommendation for now - GI will follow    LOS: 3 days   Otis Brace  MD, FACP 04/27/2017, 12:58 PM  Pager [phone] If no answer or after 5 PM call [phone]

## 2017-04-28 ENCOUNTER — Inpatient Hospital Stay (HOSPITAL_COMMUNITY): Payer: Medicare HMO

## 2017-04-28 LAB — BASIC METABOLIC PANEL
ANION GAP: 8 (ref 5–15)
BUN: 7 mg/dL (ref 6–20)
CO2: 23 mmol/L (ref 22–32)
Calcium: 9.3 mg/dL (ref 8.9–10.3)
Chloride: 108 mmol/L (ref 101–111)
Creatinine, Ser: 0.97 mg/dL (ref 0.61–1.24)
GFR calc Af Amer: >60 mL/min (ref >60)
GLUCOSE: 137 mg/dL — AB (ref 65–99)
POTASSIUM: 3.7 mmol/L (ref 3.5–5.1)
Sodium: 139 mmol/L (ref 135–145)

## 2017-04-28 LAB — GLUCOSE, CAPILLARY
GLUCOSE-CAPILLARY: 189 mg/dL — AB (ref 65–99)
Glucose-Capillary: 134 mg/dL — ABNORMAL HIGH (ref 65–99)
Glucose-Capillary: 162 mg/dL — ABNORMAL HIGH (ref 65–99)
Glucose-Capillary: 192 mg/dL — ABNORMAL HIGH (ref 65–99)
Glucose-Capillary: 193 mg/dL — ABNORMAL HIGH (ref 65–99)
Glucose-Capillary: 196 mg/dL — ABNORMAL HIGH (ref 65–99)

## 2017-04-28 LAB — HEPATIC FUNCTION PANEL
ALBUMIN: 2.6 g/dL — AB (ref 3.5–5.0)
ALT: 145 U/L — AB (ref 17–63)
AST: 207 U/L — AB (ref 15–41)
Alkaline Phosphatase: 328 U/L — ABNORMAL HIGH (ref 38–126)
BILIRUBIN DIRECT: 1.4 mg/dL — AB (ref 0.1–0.5)
Indirect Bilirubin: 1.2 mg/dL — ABNORMAL HIGH (ref 0.3–0.9)
Total Bilirubin: 2.6 mg/dL — ABNORMAL HIGH (ref 0.3–1.2)
Total Protein: 6.8 g/dL (ref 6.5–8.1)

## 2017-04-28 MED ORDER — METOPROLOL TARTRATE 25 MG/10 ML ORAL SUSPENSION
12.5000 mg | Freq: Once | ORAL | Status: AC
  Administered 2017-04-28: 12.5 mg via ORAL
  Filled 2017-04-28: qty 10

## 2017-04-28 MED ORDER — GADOBENATE DIMEGLUMINE 529 MG/ML IV SOLN
17.0000 mL | Freq: Once | INTRAVENOUS | Status: AC | PRN
  Administered 2017-04-28: 17 mL via INTRAVENOUS

## 2017-04-28 MED ORDER — ORAL CARE MOUTH RINSE
15.0000 mL | Freq: Two times a day (BID) | OROMUCOSAL | Status: DC
  Administered 2017-04-28 – 2017-05-01 (×5): 15 mL via OROMUCOSAL

## 2017-04-28 MED ORDER — ONDANSETRON HCL 4 MG/2ML IJ SOLN
4.0000 mg | Freq: Three times a day (TID) | INTRAMUSCULAR | Status: DC | PRN
  Administered 2017-04-28: 4 mg via INTRAMUSCULAR
  Filled 2017-04-28: qty 2

## 2017-04-28 MED ORDER — ONDANSETRON HCL 4 MG/2ML IJ SOLN: 4.0000 mg | Freq: Three times a day (TID) | INTRAMUSCULAR | Status: DC | PRN

## 2017-04-28 MED ORDER — AMLODIPINE BESYLATE 2.5 MG PO TABS
5.0000 mg | ORAL_TABLET | Freq: Once | ORAL | Status: AC
  Administered 2017-04-28: 5 mg via ORAL
  Filled 2017-04-28: qty 2

## 2017-04-28 MED ORDER — ASPIRIN 81 MG PO CHEW
81.0000 mg | CHEWABLE_TABLET | Freq: Every day | ORAL | Status: DC
  Administered 2017-04-28: 81 mg via ORAL
  Filled 2017-04-28: qty 1

## 2017-04-28 MED ORDER — ONDANSETRON HCL 4 MG/5ML PO SOLN
4.0000 mg | Freq: Three times a day (TID) | ORAL | Status: DC | PRN
  Filled 2017-04-28: qty 5

## 2017-04-28 NOTE — Progress Notes (Signed)
cortrak to be placed within next hour per Ovid Curd cortrak team. BP (!) 170/74 (BP Location: Right Arm)   Pulse (!) 105   Temp 97.8 F (36.6 C) (Oral)   Resp 17   Ht 5\' 3"  (1.6 m)   Wt 69.1 kg (152 lb 4.8 oz)   SpO2 95%   BMI 26.98 kg/m  Verbal from Dr.Kadakia for liquid oral amlodipine and metoprolol to be given by mouth once.  Will continue to monitor

## 2017-04-28 NOTE — Progress Notes (Signed)
10:40  11:31 bp still 184/78 Dr Terrence Dupont text paged, order given to administer Imdur 30mg , Metoprolol 12.5mg  and losartan 100mg  via cortrak.

## 2017-04-28 NOTE — Evaluation (Signed)
Physical Therapy Evaluation Patient Details Name: Calvin Perez MRN: [id number] DOB: 05-Apr-1941 Today's Date: 04/28/2017   History of Present Illness  Patient is a 76 yo male admitted 04/16/2017 with weakness, confusion, and falls.  Patient with significant weakness, refusing to ambulate, and requiring additional assistance.  Patient with SIADH.  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     PMH:  CAD, s/p PCI, MI, CABG, CVA with Rt hemiparesis, arthritis, DM, HTN, HLD, anemia  Clinical Impression  Patient presents with problems listed below.  Will benefit from acute PT to maximize functional mobility prior to discharge.  Patient with significant decrease in function since last recent admission.  Patient requiring +2 assist for transfers, and refusing ambulation. Requires assist for ADL's.  Recommend SNF at d/c for continued therapy to maximize functional mobility.    Follow Up Recommendations SNF;Supervision/Assistance - 24 hour    Equipment Recommendations  None recommended by PT    Recommendations for Other Services       Precautions / Restrictions Precautions Precautions: Fall Restrictions Weight Bearing Restrictions: No      Mobility  Bed Mobility Overal bed mobility: Needs Assistance Bed Mobility: Rolling Rolling: Mod assist         General bed mobility comments: Verbal and tactile cues to reach for rail to assist with rolling.  Assist to reach rail, and to bring hips/LE's forward to roll to both sides.  Patient declined any further mobility.  Assisted patient to bring LE's into hooklying position, with support for RLE.  Patient able to bridge and move hips over in bed (moving away from EOB).  Transfers                 General transfer comment: Declined further mobility today.  Ambulation/Gait                Stairs            Wheelchair Mobility    Modified Rankin (Stroke Patients  Only)       Balance                                             Pertinent Vitals/Pain Pain Assessment: No/denies pain    Home Living Family/patient expects to be discharged to:: Unsure Living Arrangements: Spouse/significant other;Children (3 grandchildren) Available Help at Discharge: Family;Available PRN/intermittently (Live with daughter who works.) Type of Home: House Home Access: Stairs to enter     Home Layout: Two level;Bed/bath upstairs Home Equipment: Environmental consultant - 2 wheels;Cane - single point;Shower seat;Bedside commode      Prior Function Level of Independence: Independent with assistive device(s)   Gait / Transfers Assistance Needed: Used cane     Comments: Since last admission, patient has become more weak, refusing to ambulate.  Wife reports they have started using w/c for mobility.  Patient was requiring 2 family members to transfer. Patient requiring assist for bathing/dressing and toileting.  Wife reports patient needs SNF for continued therapy before trying to come home.     Hand Dominance        Extremity/Trunk Assessment   Upper Extremity Assessment Upper Extremity Assessment: Generalized weakness (RUE slightly weaker than LUE)    Lower Extremity Assessment Lower Extremity Assessment: Generalized weakness;RLE deficits/detail RLE Deficits / Details: Strength grossly 3/5 RLE Coordination: decreased gross motor  Communication   Communication: Prefers language other than English;HOH (communicates in broken [place])  Cognition Arousal/Alertness: Awake/alert Behavior During Therapy: Flat affect Overall Cognitive Status: Difficult to assess Area of Impairment: Problem solving                             Problem Solving: Slow processing;Decreased initiation;Difficulty sequencing;Requires verbal cues;Requires tactile cues        General Comments      Exercises     Assessment/Plan    PT Assessment Patient  needs continued PT services  PT Problem List Decreased strength;Decreased activity tolerance;Decreased balance;Decreased mobility;Decreased cognition;Decreased knowledge of use of DME       PT Treatment Interventions DME instruction;Gait training;Functional mobility training;Therapeutic activities;Therapeutic exercise;Balance training;Cognitive remediation;Patient/family education    PT Goals (Current goals can be found in the Care Plan section)  Acute Rehab PT Goals Patient Stated Goal: None stated PT Goal Formulation: With patient/family Time For Goal Achievement: 05/12/17 Potential to Achieve Goals: Fair    Frequency Min 2X/week   Barriers to discharge Decreased caregiver support Do not feel wife can provide assistance required for patient at this time.    Co-evaluation               AM-PAC PT "6 Clicks" Daily Activity  Outcome Measure Difficulty turning over in bed (including adjusting bedclothes, sheets and blankets)?: Total Difficulty moving from lying on back to sitting on the side of the bed? : Total Difficulty sitting down on and standing up from a chair with arms (e.g., wheelchair, bedside commode, etc,.)?: Total Help needed moving to and from a bed to chair (including a wheelchair)?: A Lot Help needed walking in hospital room?: Total Help needed climbing 3-5 steps with a railing? : Total 6 Click Score: 7    End of Session   Activity Tolerance: Patient limited by fatigue Patient left: in bed;with call bell/phone within reach;with bed alarm set;with family/visitor present   PT Visit Diagnosis: Muscle weakness (generalized) (M62.81);History of falling (Z91.81);Difficulty in walking, not elsewhere classified (R26.2);Hemiplegia and hemiparesis Hemiplegia - Right/Left: Right Hemiplegia - dominant/non-dominant: Dominant Hemiplegia - caused by: Cerebral infarction    Time: 1730-1749 PT Time Calculation (min) (ACUTE ONLY): 19 min   Charges:   PT Evaluation $PT  Eval Moderate Complexity: 1 Procedure     PT G Codes:        Carita Pian. Sanjuana Kava, [facility] Acute Rehab Services Pager [place] 04/28/2017, 10:31 PM

## 2017-04-28 NOTE — Progress Notes (Signed)
Cortrak Tube Team Note:  Consult received to place a Cortrak feeding tube.   A 10 F Cortrak tube was placed in the L nare and secured with a nasal bridle at 72 cm. Per the Cortrak monitor reading the tube tip is postpyloric, approximately D1/D2  No x-ray is required. RN may begin using tube.   If the tube becomes dislodged please keep the tube and contact the Cortrak team at [email or web address] (password [email or web address]) for replacement.  If after hours and replacement cannot be delayed, place a NG tube and confirm placement with an abdominal x-ray.   Burtis Junes RD, LDN, CNSC Clinical Nutrition Pager: [phone] 04/28/2017 11:03 AM

## 2017-04-28 NOTE — Progress Notes (Signed)
Subjective:  Up in chair denies any chest pain or shortness of breath. Blood pressure elevated as his medications were held yesterday. Scheduled to place cortrak. GI consult and help appreciated  Objective:  Vital Signs in the last 24 hours: Temp:  [97.3 F (36.3 C)-98.1 F (36.7 C)] 97.8 F (36.6 C) (07/28 0909) Pulse Rate:  [85-105] 105 (07/28 0909) Resp:  [16-18] 17 (07/28 0909) BP: (139-174)/(66-74) 170/74 (07/28 0909) SpO2:  [95 %-98 %] 95 % (07/28 0909) Weight:  [69.1 kg (152 lb 4.8 oz)] 69.1 kg (152 lb 4.8 oz) (07/28 0507)  Intake/Output from previous day: 07/27 0701 - 07/28 0700 In: 1170.1 [P.O.:120; I.V.:1050.1] Out: 1400 [Urine:1400] Intake/Output from this shift: No intake/output data recorded.  Physical Exam: Neck: no adenopathy, no carotid bruit, no JVD and supple, symmetrical, trachea midline Lungs: clear to auscultation bilaterally Heart: regular rate and rhythm, S1, S2 normal and 2/6 systolic murmur noted Abdomen: soft, non-tender; bowel sounds normal; no masses,  no organomegaly Extremities: extremities normal, atraumatic, no cyanosis or edema  Lab Results: No results for input(s): WBC, HGB, PLT in the last 72 hours.  Recent Labs  04/27/17 0554 04/28/17 0427  NA 136 139  K 4.1 3.7  CL 106 108  CO2 23 23  GLUCOSE 149* 137*  BUN 6 7  CREATININE 0.89 0.97   No results for input(s): TROPONINI in the last 72 hours.  Invalid input(s): CK, MB Hepatic Function Panel  Recent Labs  04/28/17 0427  PROT 6.8  ALBUMIN 2.6*  AST 207*  ALT 145*  ALKPHOS 328*  BILITOT 2.6*  BILIDIR 1.4*  IBILI 1.2*   No results for input(s): CHOL in the last 72 hours. No results for input(s): PROTIME in the last 72 hours.  Imaging: Imaging results have been reviewed and Calvin Perez OA Contrast  Result Date: 04/27/2017 CLINICAL DATA:  Weakness and confusion for 3 days. Right leg weakness. EXAM: MRI HEAD WITHOUT CONTRAST MRA HEAD WITHOUT CONTRAST TECHNIQUE:  Multiplanar, multiecho pulse sequences of the brain and surrounding structures were obtained without intravenous contrast. Angiographic images of the head were obtained using MRA technique without contrast. COMPARISON:  CT of the head and cervical spine 04/24/2017 hand 04/07/2017. FINDINGS: MRI HEAD FINDINGS Brain: A remote infarct involving the inferior posterior left occipital lobe is again seen. No acute infarct, hemorrhage, or mass lesion is present. Moderate generalized atrophy and white matter disease is noted bilaterally. Remote lacunar infarcts are present within the thalami bilaterally. Dilated perivascular spaces are present within the basal ganglia. No significant extra-axial fluid collection is present. Prominent T2 signal is noted anteriorly in the pons bilaterally, worse on the left. Cerebellar atrophy is noted. Vascular: Flow is present in the major intracranial arteries. Skull and upper cervical spine: The skullbase is within normal limits. The craniocervical junction is normal. Moderate central canal stenosis is noted at C3-4 with progression. Midline sagittal structures are otherwise unremarkable. There is diffuse thinning of the corpus callosum. Sinuses/Orbits: Bilateral mastoid effusions are present. The paranasal sinuses are clear. Bilateral lens replacements are noted. Globes and orbits are otherwise within normal limits. MRA HEAD FINDINGS Internal carotid arteries are within normal limits from the high cervical segments through the ICA termini bilaterally. The A1 and M1 segments are normal. Anterior communicating artery is patent. The MCA bifurcations are intact bilaterally. ACA and MCA branch vessels are within normal limits. The vertebral arteries are codominant. The basilar artery is normal. Both posterior cerebral arteries originate from basilar tip. Tandem moderate stenoses  are present within the right P2 segment. Branch vessels demonstrate flow bilaterally with moderate distal  attenuation. IMPRESSION: 1. No acute intracranial abnormality. 2. Advanced atrophy and white matter disease likely reflects the sequela of chronic microvascular ischemia. 3. Remote infarct in the anterior pons. 4. Remote infarcts involving the bowel my bilaterally. 5. MRA demonstrates moderate right P2 segment stenoses can't disproportionate branch vessel disease in the PCA circulation compared to the anterior circulation. 6. No significant proximal stenosis, aneurysm, or branch vessel occlusion within the anterior circulation. Electronically Signed   By: San Morelle M.D.   On: 04/27/2017 12:40   Calvin Brain Wo Contrast  Result Date: 04/27/2017 CLINICAL DATA:  Weakness and confusion for 3 days. Right leg weakness. EXAM: MRI HEAD WITHOUT CONTRAST MRA HEAD WITHOUT CONTRAST TECHNIQUE: Multiplanar, multiecho pulse sequences of the brain and surrounding structures were obtained without intravenous contrast. Angiographic images of the head were obtained using MRA technique without contrast. COMPARISON:  CT of the head and cervical spine 04/24/2017 hand 04/07/2017. FINDINGS: MRI HEAD FINDINGS Brain: A remote infarct involving the inferior posterior left occipital lobe is again seen. No acute infarct, hemorrhage, or mass lesion is present. Moderate generalized atrophy and white matter disease is noted bilaterally. Remote lacunar infarcts are present within the thalami bilaterally. Dilated perivascular spaces are present within the basal ganglia. No significant extra-axial fluid collection is present. Prominent T2 signal is noted anteriorly in the pons bilaterally, worse on the left. Cerebellar atrophy is noted. Vascular: Flow is present in the major intracranial arteries. Skull and upper cervical spine: The skullbase is within normal limits. The craniocervical junction is normal. Moderate central canal stenosis is noted at C3-4 with progression. Midline sagittal structures are otherwise unremarkable. There is  diffuse thinning of the corpus callosum. Sinuses/Orbits: Bilateral mastoid effusions are present. The paranasal sinuses are clear. Bilateral lens replacements are noted. Globes and orbits are otherwise within normal limits. MRA HEAD FINDINGS Internal carotid arteries are within normal limits from the high cervical segments through the ICA termini bilaterally. The A1 and M1 segments are normal. Anterior communicating artery is patent. The MCA bifurcations are intact bilaterally. ACA and MCA branch vessels are within normal limits. The vertebral arteries are codominant. The basilar artery is normal. Both posterior cerebral arteries originate from basilar tip. Tandem moderate stenoses are present within the right P2 segment. Branch vessels demonstrate flow bilaterally with moderate distal attenuation. IMPRESSION: 1. No acute intracranial abnormality. 2. Advanced atrophy and white matter disease likely reflects the sequela of chronic microvascular ischemia. 3. Remote infarct in the anterior pons. 4. Remote infarcts involving the bowel my bilaterally. 5. MRA demonstrates moderate right P2 segment stenoses can't disproportionate branch vessel disease in the PCA circulation compared to the anterior circulation. 6. No significant proximal stenosis, aneurysm, or branch vessel occlusion within the anterior circulation. Electronically Signed   By: San Morelle M.D.   On: 04/27/2017 12:40   Dg Esophagus  Result Date: 04/27/2017 CLINICAL DATA:  Evaluate for stricture. EXAM: ESOPHOGRAM/BARIUM SWALLOW TECHNIQUE: Single contrast examination was performed using  thin barium. FLUOROSCOPY TIME:  Fluoroscopy Time:  1 minutes 12 second Radiation Exposure Index (if provided by the fluoroscopic device): n/a Number of Acquired Spot Images: 0 COMPARISON:  None. FINDINGS: Modified limited exam was performed with the patient in the [place] orientation. The patient was only able to take a few sips of the contrast material.  An adequate volume of contrast was ingested however to confirm patency of the esophagus. No high-grade stricture  or mass identified. Moderate to advanced esophageal dysmotility noted with multiple non propulsive tertiary waves. IMPRESSION: 1. No evidence for esophageal obstruction. No high-grade stricture or mass noted. 2. Esophageal dysmotility. Electronically Signed   By: Kerby Moors M.D.   On: 04/27/2017 14:54   Calvin Liver W Wo Contrast  Result Date: 04/28/2017 CLINICAL DATA:  Followup abnormal CT of the liver. EXAM: MRI ABDOMEN WITHOUT AND WITH CONTRAST TECHNIQUE: Multiplanar multisequence Calvin imaging of the abdomen was performed both before and after the administration of intravenous contrast. CONTRAST:  21mL MULTIHANCE GADOBENATE DIMEGLUMINE 529 MG/ML IV SOLN COMPARISON:  CT from 04/10/2017 FINDINGS: Lower chest: Small bilateral pleural effusions. Hepatobiliary: There are innumerable T2 hyperintense and T1 hypointense lesions identified throughout both lobes of the liver which are highly suspicious for widespread metastatic disease. The post-contrast imaging is essentially nondiagnostic due to motion artifact. Nevertheless, the appearance would be highly unusual for benign process such as multiple liver hemangiomas. Mild diffuse gallbladder wall edema. No gallstones. No biliary dilatation identified. Pancreas: No mass, inflammatory changes, or other parenchymal abnormality identified. Spleen:  Within normal limits in size and appearance. Adrenals/Urinary Tract: The adrenal glands appear normal. Unremarkable appearance of the kidneys. Stomach/Bowel: Visualized portions within the abdomen are unremarkable. Vascular/Lymphatic: Aortic atherosclerosis noted. No aneurysm. Enlarged retroperitoneal lymph nodes are identified. Periaortic node measures 1 cm, image number 1 of series 6. Aortocaval node measures 1 cm, image number 6 of series 6. Other:  Trace abdominal ascites.  Body wall edema noted. Musculoskeletal:  Diffuse signal abnormality is identified. Multiple heterogeneous T2 hyperintense foci noted suspicious for diffuse bone metastases. Not well visualized on the CT images. IMPRESSION: 1. Innumerable lesions are identified throughout the liver which are highly suspicious for widespread metastases. Consider pathologic correlation with percutaneous ultrasound-guided liver biopsy. PET-CT may also be useful to complete radiographic staging. 2. Enlarged upper abdominal lymph nodes compatible with metastatic adenopathy. 3. Suspect diffuse skeletal metastases. Electronically Signed   By: Kerby Moors M.D.   On: 04/28/2017 09:54   Dg Swallowing Func-speech Pathology  Result Date: 04/27/2017 Objective Swallowing Evaluation: Type of Study: MBS-Modified Barium Swallow Study Patient Details Name: Calvin Perez MRN: [id number] Date of Birth: 05-Jan-1941 Today's Date: 04/27/2017 Time: SLP Start Time (ACUTE ONLY): 1014-SLP Stop Time (ACUTE ONLY): 1030 SLP Time Calculation (min) (ACUTE ONLY): 16 min Past Medical History: Past Medical History: Diagnosis Date . Arthritis  . Coronary artery disease  . Diabetes mellitus   insulin dependent . Dyslipidemia  . Hypertension  Past Surgical History: Past Surgical History: Procedure Laterality Date . CARDIAC CATHETERIZATION   . CORONARY ARTERY BYPASS GRAFT   . FLEXIBLE SIGMOIDOSCOPY Left 04/13/2017  Procedure: FLEXIBLE SIGMOIDOSCOPY;  Surgeon: Arta Silence, MD;  [facility] ENDOSCOPY;  Service: Endoscopy;  Laterality: Left; . LEFT HEART CATHETERIZATION WITH CORONARY ANGIOGRAM N/A 09/15/2013  Procedure: LEFT HEART CATHETERIZATION WITH CORONARY ANGIOGRAM;  Surgeon: Clent Demark, MD;  Location: [place] CATH LAB;  Service: Cardiovascular;  Laterality: N/A; . open heart surgery  1998 . 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 MBS 04/14/17 revealed penetration before the swallow and silent aspiration during (cued cough ineffective), intermittent trace penetration with nectar and honey, trace aspiration with nectar (compensatory strategies ineffective). Recommended regular and nectar thick liquids, free water protocol. C XR  not repeated since 04/10/17 RIGHT basilar scarring without acute infiltrate.  Subjective: Alert, cooperative Assessment / Plan / Recommendation CHL IP CLINICAL IMPRESSIONS 04/27/2017 Clinical Impression   Results similar to [facility] 04/14/17 with addition of esophageal involvement noted during today's study. Decreased oral cohesion and control resulting in premature spill into laryngeal vestibule and aspiration before the swallow of thin barium producing with mild-moderate reflexive cough. Prolonged mastication of solid falling to vallecule and pt stating "I cannot swallow" and he would not attempt with multiple verbal cues/encouragement. Subseqeunt bolus of puree given, initiating swallow and transiting solid vallecular residue and puree mixture through UES. Esophageal scan revealed esophagus filled with barium residual (did not appear to have effective peristalsis- MBS does not diagnose below the UES). Nectar thick barium offered for possbile improved esophageal transit with adament refusal and SLP giving max encouragement/explanations. Suspect main reason for his po refusal and statements of being unable to swallow. Recommend continue Dys 3 (pt would refuse puree according to son) and nectar thick. Will contact MD re: GI consult to further investigate esophageal function. Consider short term alternate feeding for nutrition. ST will follow.  SLP Visit Diagnosis Dysphagia, pharyngoesophageal phase (R13.14) Attention and concentration deficit following -- Frontal lobe and executive function deficit  following -- Impact on safety and function Severe aspiration risk   CHL IP TREATMENT RECOMMENDATION 04/27/2017 Treatment Recommendations Therapy as outlined in treatment plan below   Prognosis 04/27/2017 Prognosis for Safe Diet Advancement Fair Barriers to Reach Goals Cognitive deficits Barriers/Prognosis Comment -- CHL IP DIET RECOMMENDATION 04/27/2017 SLP Diet Recommendations Nectar thick liquid;Dysphagia 3 (Mech soft) solids Liquid Administration via Cup;No straw Medication Administration Crushed with puree Compensations Slow rate;Small sips/bites;Follow solids with liquid;Clear throat intermittently Postural Changes Remain semi-upright after after feeds/meals (Comment);Seated upright at 90 degrees   CHL IP OTHER RECOMMENDATIONS 04/27/2017 Recommended Consults Consider GI evaluation;Consider esophageal assessment Oral Care Recommendations Oral care BID Other Recommendations --   CHL IP FOLLOW UP RECOMMENDATIONS 04/27/2017 Follow up Recommendations Home health SLP   CHL IP FREQUENCY AND DURATION 04/27/2017 Speech Therapy Frequency (ACUTE ONLY) min 2x/week Treatment Duration 2 weeks      CHL IP ORAL PHASE 04/27/2017 Oral Phase Impaired Oral - Pudding Teaspoon -- Oral - Pudding Cup -- Oral - Honey Teaspoon -- Oral - Honey Cup -- Oral - Nectar Teaspoon -- Oral - Nectar Cup WFL Oral - Nectar Straw -- Oral - Thin Teaspoon -- Oral - Thin Cup Decreased bolus cohesion Oral - Thin Straw -- Oral - Puree -- Oral - Mech Soft -- Oral - Regular Delayed oral transit Oral - Multi-Consistency -- Oral - Pill -- Oral Phase - Comment --  CHL IP PHARYNGEAL PHASE 04/27/2017 Pharyngeal Phase Impaired Pharyngeal- Pudding Teaspoon -- Pharyngeal -- Pharyngeal- Pudding Cup -- Pharyngeal -- Pharyngeal- Honey Teaspoon -- Pharyngeal -- Pharyngeal- Honey Cup NT Pharyngeal -- Pharyngeal- Nectar Teaspoon -- Pharyngeal -- Pharyngeal- Nectar Cup Delayed swallow initiation-pyriform sinuses Pharyngeal -- Pharyngeal- Nectar Straw -- Pharyngeal -- Pharyngeal-  Thin Teaspoon -- Pharyngeal -- Pharyngeal- Thin Cup Penetration/Aspiration before swallow;Pharyngeal residue - valleculae;Reduced epiglottic inversion Pharyngeal Material enters airway, passes BELOW cords and not ejected out despite cough attempt by patient Pharyngeal- Thin Straw NT Pharyngeal -- Pharyngeal- Puree WFL Pharyngeal -- Pharyngeal- Mechanical Soft -- Pharyngeal -- Pharyngeal- Regular Delayed swallow initiation-vallecula Pharyngeal -- Pharyngeal- Multi-consistency -- Pharyngeal -- Pharyngeal- Pill NT Pharyngeal -- Pharyngeal Comment --  CHL IP CERVICAL ESOPHAGEAL PHASE 04/27/2017 Cervical Esophageal Phase WFL Pudding Teaspoon -- Pudding Cup -- Honey Teaspoon --  Honey Cup -- Nectar Teaspoon -- Nectar Cup -- Nectar Straw -- Thin Teaspoon -- Thin Cup -- Thin Straw -- Puree -- Mechanical Soft -- Regular -- Multi-consistency -- Pill -- Cervical Esophageal Comment -- CHL IP GO 04/23/2013 Functional Assessment Tool Used clinical judgement Functional Limitations Swallowing Swallow Current Status (E7035) CK Swallow Goal Status (K0938) CK Swallow Discharge Status (H8299) CK Motor Speech Current Status (B7169) (None) Motor Speech Goal Status (C7893) (None) Motor Speech Goal Status (Y1017) (None) Spoken Language Comprehension Current Status (P1025) (None) Spoken Language Comprehension Goal Status (E5277) (None) Spoken Language Comprehension Discharge Status [id number]) (None) Spoken Language Expression Current Status (N3614) (None) Spoken Language Expression Goal Status (E3154) (None) Spoken Language Expression Discharge Status [phone]) (None) Attention Current Status (Y1950) (None) Attention Goal Status (D3267) (None) Attention Discharge Status (T2458) (None) Memory Current Status (K9983) (None) Memory Goal Status (J8250) (None) Memory Discharge Status (N3976) (None) Voice Current Status (B3419) (None) Voice Goal Status (F7902) (None) Voice Discharge Status (I0973) (None) Other Speech-Language Pathology Functional  Limitation Current Status (Z3299) (None) Other Speech-Language Pathology Functional Limitation Goal Status (M4268) (None) Other Speech-Language Pathology Functional Limitation Discharge Status [phone]) (None) Houston Siren 04/27/2017, 3:26 PM [name] Colvin Caroli.Ed Engineer, agricultural [phone]               Cardiac Studies:  Assessment/Plan:  SIADH-stable Hyponatremia-corrected CAD CABG Old MI H/O RCA stent Stroke with right sided weakness Dysphagia Hypertension Type II DM, II Anemia of chronic disease Protein calorie malnutrition, moderate Multiple low-density liver lesions Abnormal LFTs Plan Continue present management Scheduled for MRI of the liver per GI and barium swallow Check labs  LOS: 4 days    Charolette Forward 04/28/2017, 9:54 AM

## 2017-04-28 NOTE — Progress Notes (Signed)
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 Findings reviewed with patient's daughter at the bedside. (Due to language barrier, patient himself unable to converse about findings directly with me, so daughter will explain it to him.)  The barium swallow showed significant esophageal dysmotility, without evidence of an obstructing lesion or stricture. This is not amenable to endoscopic therapy or medication. It could potentially be treated by dietary modifications, but for now, I fully agree with nutrition support via the nasoenteric feeding tube.  Recommendations:  1. I discussed with the patient's daughter, the option of an ultrasound-guided liver biopsy, as mentioned in the MRI report. This would help to confirm the diagnosis. It is not clear that it would change management, but it would clarify diagnosis and prognosis. She would like to discuss this further with Dr. Doylene Canard, but at least preliminarily, she is interested in proceeding with that, so I will stop the patient's tube feedings after midnight Sunday night, in case a decision is made to proceed with a liver biopsy on Monday.  2. If long-term enteral nutritional support is desired, the patient could have placement of a gastrostomy tube. Given his frail medical status, it would probably be best to have this placed by interventional radiology, rather than endoscopically.  3. At this point, there is probably not a further role for GI, so I will sign off. However, please call if we can be of further assistance with this patient's care.  Time 20', more than 50% counseling/education.  Cleotis Nipper, M.D. Pager [phone] If no answer or after 5 PM call [phone]

## 2017-04-29 LAB — GLUCOSE, CAPILLARY
GLUCOSE-CAPILLARY: 256 mg/dL — AB (ref 65–99)
GLUCOSE-CAPILLARY: 322 mg/dL — AB (ref 65–99)
Glucose-Capillary: 217 mg/dL — ABNORMAL HIGH (ref 65–99)
Glucose-Capillary: 290 mg/dL — ABNORMAL HIGH (ref 65–99)
Glucose-Capillary: 341 mg/dL — ABNORMAL HIGH (ref 65–99)

## 2017-04-29 LAB — BASIC METABOLIC PANEL
Anion gap: 8 (ref 5–15)
BUN: 9 mg/dL (ref 6–20)
CO2: 25 mmol/L (ref 22–32)
Calcium: 9.4 mg/dL (ref 8.9–10.3)
Chloride: 107 mmol/L (ref 101–111)
Creatinine, Ser: 1 mg/dL (ref 0.61–1.24)
GFR calc Af Amer: >60 mL/min (ref >60)
GFR calc non Af Amer: >60 mL/min (ref >60)
Glucose, Bld: 251 mg/dL — ABNORMAL HIGH (ref 65–99)
Potassium: 3.8 mmol/L (ref 3.5–5.1)
Sodium: 140 mmol/L (ref 135–145)

## 2017-04-29 MED ORDER — METOPROLOL TARTRATE 12.5 MG HALF TABLET
12.5000 mg | ORAL_TABLET | Freq: Two times a day (BID) | ORAL | Status: DC
  Administered 2017-04-29 – 2017-04-30 (×3): 12.5 mg
  Filled 2017-04-29 (×3): qty 1

## 2017-04-29 MED ORDER — GABAPENTIN 250 MG/5ML PO SOLN
100.0000 mg | Freq: Every day | ORAL | Status: DC
  Administered 2017-04-29 – 2017-05-01 (×2): 100 mg
  Filled 2017-04-29 (×2): qty 2

## 2017-04-29 MED ORDER — ASPIRIN 81 MG PO CHEW
81.0000 mg | CHEWABLE_TABLET | Freq: Every day | ORAL | Status: DC
  Administered 2017-04-29 – 2017-05-01 (×3): 81 mg
  Filled 2017-04-29 (×3): qty 1

## 2017-04-29 MED ORDER — LOSARTAN POTASSIUM 50 MG PO TABS
100.0000 mg | ORAL_TABLET | Freq: Every day | ORAL | Status: DC
  Administered 2017-04-29 – 2017-05-01 (×3): 100 mg
  Filled 2017-04-29 (×3): qty 2

## 2017-04-29 MED ORDER — MIRTAZAPINE 15 MG PO TBDP
15.0000 mg | ORAL_TABLET | Freq: Every day | ORAL | Status: DC
  Administered 2017-04-29 – 2017-05-01 (×2): 15 mg
  Filled 2017-04-29 (×2): qty 1

## 2017-04-29 MED ORDER — NITROGLYCERIN 0.4 MG/HR TD PT24
0.4000 mg | MEDICATED_PATCH | Freq: Every day | TRANSDERMAL | Status: DC
  Administered 2017-04-29 – 2017-05-01 (×3): 0.4 mg via TRANSDERMAL
  Filled 2017-04-29 (×3): qty 1

## 2017-04-29 MED ORDER — AMLODIPINE BESYLATE 5 MG PO TABS
5.0000 mg | ORAL_TABLET | Freq: Two times a day (BID) | ORAL | Status: DC
  Administered 2017-04-29 – 2017-05-01 (×5): 5 mg
  Filled 2017-04-29 (×5): qty 1

## 2017-04-29 MED ORDER — ATORVASTATIN CALCIUM 40 MG PO TABS
80.0000 mg | ORAL_TABLET | Freq: Every day | ORAL | Status: DC
  Administered 2017-04-29 – 2017-05-01 (×3): 80 mg
  Filled 2017-04-29 (×3): qty 2

## 2017-04-29 MED ORDER — BISACODYL 10 MG RE SUPP
10.0000 mg | Freq: Every day | RECTAL | Status: DC | PRN
  Administered 2017-04-29 – 2017-05-01 (×2): 10 mg via RECTAL
  Filled 2017-04-29 (×2): qty 1

## 2017-04-29 NOTE — Progress Notes (Signed)
Subjective:  Patient denies any complaints. Tolerating NG tube feeding  Objective:  Vital Signs in the last 24 hours: Temp:  [98.1 F (36.7 C)-98.4 F (36.9 C)] 98.2 F (36.8 C) (07/29 0502) Pulse Rate:  [80-106] 106 (07/29 1023) Resp:  [17-18] 18 (07/29 0502) BP: (152-174)/(76-84) 167/80 (07/29 1023) SpO2:  [95 %-98 %] 98 % (07/29 0502) Weight:  [69.5 kg (153 lb 4.8 oz)] 69.5 kg (153 lb 4.8 oz) (07/29 0502)  Intake/Output from previous day: 07/28 0701 - 07/29 0700 In: 890.7 [I.V.:480; NG/GT:410.7] Out: -  Intake/Output from this shift: No intake/output data recorded.  Physical Exam: Exam unchanged  Lab Results: No results for input(s): WBC, HGB, PLT in the last 72 hours.  Recent Labs  04/28/17 0427 04/29/17 0417  NA 139 140  K 3.7 3.8  CL 108 107  CO2 23 25  GLUCOSE 137* 251*  BUN 7 9  CREATININE 0.97 1.00   No results for input(s): TROPONINI in the last 72 hours.  Invalid input(s): CK, MB Hepatic Function Panel  Recent Labs  04/28/17 0427  PROT 6.8  ALBUMIN 2.6*  AST 207*  ALT 145*  ALKPHOS 328*  BILITOT 2.6*  BILIDIR 1.4*  IBILI 1.2*   No results for input(s): CHOL in the last 72 hours. No results for input(s): PROTIME in the last 72 hours.  Imaging: Imaging results have been reviewed and Mr Calvin Perez XT Contrast  Result Date: 04/27/2017 CLINICAL DATA:  Weakness and confusion for 3 days. Right leg weakness. EXAM: MRI HEAD WITHOUT CONTRAST MRA HEAD WITHOUT CONTRAST TECHNIQUE: Multiplanar, multiecho pulse sequences of the Calvin and surrounding structures were obtained without intravenous contrast. Angiographic images of the head were obtained using MRA technique without contrast. COMPARISON:  CT of the head and cervical spine 04/24/2017 hand 04/07/2017. FINDINGS: MRI HEAD FINDINGS Calvin: A remote infarct involving the inferior posterior left occipital lobe is again seen. No acute infarct, hemorrhage, or mass lesion is present. Moderate generalized  atrophy and white matter disease is noted bilaterally. Remote lacunar infarcts are present within the thalami bilaterally. Dilated perivascular spaces are present within the basal ganglia. No significant extra-axial fluid collection is present. Prominent T2 signal is noted anteriorly in the pons bilaterally, worse on the left. Cerebellar atrophy is noted. Vascular: Flow is present in the major intracranial arteries. Skull and upper cervical spine: The skullbase is within normal limits. The craniocervical junction is normal. Moderate central canal stenosis is noted at C3-4 with progression. Midline sagittal structures are otherwise unremarkable. There is diffuse thinning of the corpus callosum. Sinuses/Orbits: Bilateral mastoid effusions are present. The paranasal sinuses are clear. Bilateral lens replacements are noted. Globes and orbits are otherwise within normal limits. MRA HEAD FINDINGS Internal carotid arteries are within normal limits from the high cervical segments through the ICA termini bilaterally. The A1 and M1 segments are normal. Anterior communicating artery is patent. The MCA bifurcations are intact bilaterally. ACA and MCA branch vessels are within normal limits. The vertebral arteries are codominant. The basilar artery is normal. Both posterior cerebral arteries originate from basilar tip. Tandem moderate stenoses are present within the right P2 segment. Branch vessels demonstrate flow bilaterally with moderate distal attenuation. IMPRESSION: 1. No acute intracranial abnormality. 2. Advanced atrophy and white matter disease likely reflects the sequela of chronic microvascular ischemia. 3. Remote infarct in the anterior pons. 4. Remote infarcts involving the bowel my bilaterally. 5. MRA demonstrates moderate right P2 segment stenoses can't disproportionate branch vessel disease in the PCA circulation  compared to the anterior circulation. 6. No significant proximal stenosis, aneurysm, or branch vessel  occlusion within the anterior circulation. Electronically Signed   By: San Morelle M.D.   On: 04/27/2017 12:40   Mr Calvin Perez Contrast  Result Date: 04/27/2017 CLINICAL DATA:  Weakness and confusion for 3 days. Right leg weakness. EXAM: MRI HEAD WITHOUT CONTRAST MRA HEAD WITHOUT CONTRAST TECHNIQUE: Multiplanar, multiecho pulse sequences of the Calvin and surrounding structures were obtained without intravenous contrast. Angiographic images of the head were obtained using MRA technique without contrast. COMPARISON:  CT of the head and cervical spine 04/24/2017 hand 04/07/2017. FINDINGS: MRI HEAD FINDINGS Calvin: A remote infarct involving the inferior posterior left occipital lobe is again seen. No acute infarct, hemorrhage, or mass lesion is present. Moderate generalized atrophy and white matter disease is noted bilaterally. Remote lacunar infarcts are present within the thalami bilaterally. Dilated perivascular spaces are present within the basal ganglia. No significant extra-axial fluid collection is present. Prominent T2 signal is noted anteriorly in the pons bilaterally, worse on the left. Cerebellar atrophy is noted. Vascular: Flow is present in the major intracranial arteries. Skull and upper cervical spine: The skullbase is within normal limits. The craniocervical junction is normal. Moderate central canal stenosis is noted at C3-4 with progression. Midline sagittal structures are otherwise unremarkable. There is diffuse thinning of the corpus callosum. Sinuses/Orbits: Bilateral mastoid effusions are present. The paranasal sinuses are clear. Bilateral lens replacements are noted. Globes and orbits are otherwise within normal limits. MRA HEAD FINDINGS Internal carotid arteries are within normal limits from the high cervical segments through the ICA termini bilaterally. The A1 and M1 segments are normal. Anterior communicating artery is patent. The MCA bifurcations are intact bilaterally. ACA and MCA  branch vessels are within normal limits. The vertebral arteries are codominant. The basilar artery is normal. Both posterior cerebral arteries originate from basilar tip. Tandem moderate stenoses are present within the right P2 segment. Branch vessels demonstrate flow bilaterally with moderate distal attenuation. IMPRESSION: 1. No acute intracranial abnormality. 2. Advanced atrophy and white matter disease likely reflects the sequela of chronic microvascular ischemia. 3. Remote infarct in the anterior pons. 4. Remote infarcts involving the bowel my bilaterally. 5. MRA demonstrates moderate right P2 segment stenoses can't disproportionate branch vessel disease in the PCA circulation compared to the anterior circulation. 6. No significant proximal stenosis, aneurysm, or branch vessel occlusion within the anterior circulation. Electronically Signed   By: San Morelle M.D.   On: 04/27/2017 12:40   Dg Esophagus  Result Date: 04/27/2017 CLINICAL DATA:  Evaluate for stricture. EXAM: ESOPHOGRAM/BARIUM SWALLOW TECHNIQUE: Single contrast examination was performed using  thin barium. FLUOROSCOPY TIME:  Fluoroscopy Time:  1 minutes 12 second Radiation Exposure Index (if provided by the fluoroscopic device): n/a Number of Acquired Spot Images: 0 COMPARISON:  None. FINDINGS: Modified limited exam was performed with the patient in the [place] orientation. The patient was only able to take a few sips of the contrast material. An adequate volume of contrast was ingested however to confirm patency of the esophagus. No high-grade stricture or mass identified. Moderate to advanced esophageal dysmotility noted with multiple non propulsive tertiary waves. IMPRESSION: 1. No evidence for esophageal obstruction. No high-grade stricture or mass noted. 2. Esophageal dysmotility. Electronically Signed   By: Kerby Moors M.D.   On: 04/27/2017 14:54   Mr Liver W Perez Contrast  Result Date: 04/28/2017 CLINICAL DATA:   Followup abnormal CT of the liver. EXAM: MRI ABDOMEN WITHOUT  AND WITH CONTRAST TECHNIQUE: Multiplanar multisequence MR imaging of the abdomen was performed both before and after the administration of intravenous contrast. CONTRAST:  30mL MULTIHANCE GADOBENATE DIMEGLUMINE 529 MG/ML IV SOLN COMPARISON:  CT from 04/10/2017 FINDINGS: Lower chest: Small bilateral pleural effusions. Hepatobiliary: There are innumerable T2 hyperintense and T1 hypointense lesions identified throughout both lobes of the liver which are highly suspicious for widespread metastatic disease. The post-contrast imaging is essentially nondiagnostic due to motion artifact. Nevertheless, the appearance would be highly unusual for benign process such as multiple liver hemangiomas. Mild diffuse gallbladder wall edema. No gallstones. No biliary dilatation identified. Pancreas: No mass, inflammatory changes, or other parenchymal abnormality identified. Spleen:  Within normal limits in size and appearance. Adrenals/Urinary Tract: The adrenal glands appear normal. Unremarkable appearance of the kidneys. Stomach/Bowel: Visualized portions within the abdomen are unremarkable. Vascular/Lymphatic: Aortic atherosclerosis noted. No aneurysm. Enlarged retroperitoneal lymph nodes are identified. Periaortic node measures 1 cm, image number 1 of series 6. Aortocaval node measures 1 cm, image number 6 of series 6. Other:  Trace abdominal ascites.  Body wall edema noted. Musculoskeletal: Diffuse signal abnormality is identified. Multiple heterogeneous T2 hyperintense foci noted suspicious for diffuse bone metastases. Not well visualized on the CT images. IMPRESSION: 1. Innumerable lesions are identified throughout the liver which are highly suspicious for widespread metastases. Consider pathologic correlation with percutaneous ultrasound-guided liver biopsy. PET-CT may also be useful to complete radiographic staging. 2. Enlarged upper abdominal lymph nodes compatible  with metastatic adenopathy. 3. Suspect diffuse skeletal metastases. Electronically Signed   By: Kerby Moors M.D.   On: 04/28/2017 09:54   Dg Swallowing Func-speech Pathology  Result Date: 04/27/2017 Objective Swallowing Evaluation: Type of Study: MBS-Modified Barium Swallow Study Patient Details Name: Calvin Perez MRN: [id number] Date of Birth: 08/28/1941 Today's Date: 04/27/2017 Time: SLP Start Time (ACUTE ONLY): 1014-SLP Stop Time (ACUTE ONLY): 1030 SLP Time Calculation (min) (ACUTE ONLY): 16 min Past Medical History: Past Medical History: Diagnosis Date . Arthritis  . Coronary artery disease  . Diabetes mellitus   insulin dependent . Dyslipidemia  . Hypertension  Past Surgical History: Past Surgical History: Procedure Laterality Date . CARDIAC CATHETERIZATION   . CORONARY ARTERY BYPASS GRAFT   . FLEXIBLE SIGMOIDOSCOPY Left 04/13/2017  Procedure: FLEXIBLE SIGMOIDOSCOPY;  Surgeon: Arta Silence, MD;  Location: [facility] ENDOSCOPY;  Service: Endoscopy;  Laterality: Left; . LEFT HEART CATHETERIZATION WITH CORONARY ANGIOGRAM N/A 09/15/2013  Procedure: LEFT HEART CATHETERIZATION WITH CORONARY ANGIOGRAM;  Surgeon: Clent Demark, MD;  Location: [facility] CATH LAB;  Service: Cardiovascular;  Laterality: N/A; . open heart surgery  1998 . 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 MBS 04/14/17 revealed penetration before the swallow and silent aspiration during (cued cough ineffective), intermittent trace penetration with nectar and honey, trace aspiration with nectar (compensatory strategies ineffective). Recommended regular and nectar thick liquids, free water protocol. C XR not repeated since 04/10/17 RIGHT basilar scarring without acute  infiltrate.  Subjective: Alert, cooperative Assessment / Plan / Recommendation CHL IP CLINICAL IMPRESSIONS 04/27/2017 Clinical Impression   Results similar to [facility] 04/14/17 with addition of esophageal involvement noted during today's study. Decreased oral cohesion and control resulting in premature spill into laryngeal vestibule and aspiration before the swallow of thin barium producing with mild-moderate reflexive cough. Prolonged mastication  of solid falling to vallecule and pt stating "I cannot swallow" and he would not attempt with multiple verbal cues/encouragement. Subseqeunt bolus of puree given, initiating swallow and transiting solid vallecular residue and puree mixture through UES. Esophageal scan revealed esophagus filled with barium residual (did not appear to have effective peristalsis- MBS does not diagnose below the UES). Nectar thick barium offered for possbile improved esophageal transit with adament refusal and SLP giving max encouragement/explanations. Suspect main reason for his po refusal and statements of being unable to swallow. Recommend continue Dys 3 (pt would refuse puree according to son) and nectar thick. Will contact MD re: GI consult to further investigate esophageal function. Consider short term alternate feeding for nutrition. ST will follow.  SLP Visit Diagnosis Dysphagia, pharyngoesophageal phase (R13.14) Attention and concentration deficit following -- Frontal lobe and executive function deficit following -- Impact on safety and function Severe aspiration risk   CHL IP TREATMENT RECOMMENDATION 04/27/2017 Treatment Recommendations Therapy as outlined in treatment plan below   Prognosis 04/27/2017 Prognosis for Safe Diet Advancement Fair Barriers to Reach Goals Cognitive deficits Barriers/Prognosis Comment -- CHL IP DIET RECOMMENDATION 04/27/2017 SLP Diet Recommendations Nectar thick liquid;Dysphagia 3 (Mech soft) solids Liquid Administration via Cup;No straw Medication Administration  Crushed with puree Compensations Slow rate;Small sips/bites;Follow solids with liquid;Clear throat intermittently Postural Changes Remain semi-upright after after feeds/meals (Comment);Seated upright at 90 degrees   CHL IP OTHER RECOMMENDATIONS 04/27/2017 Recommended Consults Consider GI evaluation;Consider esophageal assessment Oral Care Recommendations Oral care BID Other Recommendations --   CHL IP FOLLOW UP RECOMMENDATIONS 04/27/2017 Follow up Recommendations Home health SLP   CHL IP FREQUENCY AND DURATION 04/27/2017 Speech Therapy Frequency (ACUTE ONLY) min 2x/week Treatment Duration 2 weeks      CHL IP ORAL PHASE 04/27/2017 Oral Phase Impaired Oral - Pudding Teaspoon -- Oral - Pudding Cup -- Oral - Honey Teaspoon -- Oral - Honey Cup -- Oral - Nectar Teaspoon -- Oral - Nectar Cup WFL Oral - Nectar Straw -- Oral - Thin Teaspoon -- Oral - Thin Cup Decreased bolus cohesion Oral - Thin Straw -- Oral - Puree -- Oral - Mech Soft -- Oral - Regular Delayed oral transit Oral - Multi-Consistency -- Oral - Pill -- Oral Phase - Comment --  CHL IP PHARYNGEAL PHASE 04/27/2017 Pharyngeal Phase Impaired Pharyngeal- Pudding Teaspoon -- Pharyngeal -- Pharyngeal- Pudding Cup -- Pharyngeal -- Pharyngeal- Honey Teaspoon -- Pharyngeal -- Pharyngeal- Honey Cup NT Pharyngeal -- Pharyngeal- Nectar Teaspoon -- Pharyngeal -- Pharyngeal- Nectar Cup Delayed swallow initiation-pyriform sinuses Pharyngeal -- Pharyngeal- Nectar Straw -- Pharyngeal -- Pharyngeal- Thin Teaspoon -- Pharyngeal -- Pharyngeal- Thin Cup Penetration/Aspiration before swallow;Pharyngeal residue - valleculae;Reduced epiglottic inversion Pharyngeal Material enters airway, passes BELOW cords and not ejected out despite cough attempt by patient Pharyngeal- Thin Straw NT Pharyngeal -- Pharyngeal- Puree WFL Pharyngeal -- Pharyngeal- Mechanical Soft -- Pharyngeal -- Pharyngeal- Regular Delayed swallow initiation-vallecula Pharyngeal -- Pharyngeal- Multi-consistency -- Pharyngeal  -- Pharyngeal- Pill NT Pharyngeal -- Pharyngeal Comment --  CHL IP CERVICAL ESOPHAGEAL PHASE 04/27/2017 Cervical Esophageal Phase WFL Pudding Teaspoon -- Pudding Cup -- Honey Teaspoon -- Honey Cup -- Nectar Teaspoon -- Nectar Cup -- Nectar Straw -- Thin Teaspoon -- Thin Cup -- Thin Straw -- Puree -- Mechanical Soft -- Regular -- Multi-consistency -- Pill -- Cervical Esophageal Comment -- CHL IP GO 04/23/2013 Functional Assessment Tool Used clinical judgement Functional Limitations Swallowing Swallow Current Status (T4656) CK Swallow Goal Status (C1275) CK Swallow Discharge Status (T7001) CK Motor Speech Current Status (V4944) (  None) Motor Speech Goal Status [phone] (None) Motor Speech Goal Status [phone]) (None) Spoken Language Comprehension Current Status [phone]) (None) Spoken Language Comprehension Goal Status (P3295) (None) Spoken Language Comprehension Discharge Status [phone]) (None) Spoken Language Expression Current Status [phone]) (None) Spoken Language Expression Goal Status [phone]) (None) Spoken Language Expression Discharge Status [phone]) (None) Attention Current Status (F5732) (None) Attention Goal Status (K0254) (None) Attention Discharge Status [phone]) (None) Memory Current Status (B7628) (None) Memory Goal Status (B1517) (None) Memory Discharge Status (O1607) (None) Voice Current Status (P7106) (None) Voice Goal Status (Y6948) (None) Voice Discharge Status (N4627) (None) Other Speech-Language Pathology Functional Limitation Current Status (O3500) (None) Other Speech-Language Pathology Functional Limitation Goal Status (X3818) (None) Other Speech-Language Pathology Functional Limitation Discharge Status [phone]) (None) [place] Siren 04/27/2017, 3:26 PM [name] Colvin Caroli.Ed Engineer, agricultural [phone]               Cardiac Studies:  Assessment/Plan:   Metastatic CA of unknown primary SIADH Hyponatremia-corrected CAD CABG Old [place] H/O RCA stent Stroke with right sided  weakness Dysphagia Hypertension Type II DM, II Anemia of chronic disease Protein calorie malnutrition, moderate Abnormal LFTs Plan Continue present management Family will discuss further with  Dr. Doylene Canard regarding further management.  LOS: 5 days    Charolette Forward 04/29/2017, 10:32 AM

## 2017-04-30 LAB — BASIC METABOLIC PANEL
Anion gap: 9 (ref 5–15)
BUN: 11 mg/dL (ref 6–20)
CALCIUM: 9.6 mg/dL (ref 8.9–10.3)
CO2: 26 mmol/L (ref 22–32)
CREATININE: 1.01 mg/dL (ref 0.61–1.24)
Chloride: 108 mmol/L (ref 101–111)
GFR calc Af Amer: >60 mL/min (ref >60)
GLUCOSE: 253 mg/dL — AB (ref 65–99)
POTASSIUM: 4 mmol/L (ref 3.5–5.1)
SODIUM: 143 mmol/L (ref 135–145)

## 2017-04-30 LAB — GLUCOSE, CAPILLARY
GLUCOSE-CAPILLARY: 183 mg/dL — AB (ref 65–99)
GLUCOSE-CAPILLARY: 209 mg/dL — AB (ref 65–99)
GLUCOSE-CAPILLARY: 336 mg/dL — AB (ref 65–99)
GLUCOSE-CAPILLARY: 390 mg/dL — AB (ref 65–99)
Glucose-Capillary: 251 mg/dL — ABNORMAL HIGH (ref 65–99)
Glucose-Capillary: 240 mg/dL — ABNORMAL HIGH (ref 65–99)

## 2017-04-30 MED ORDER — METOCLOPRAMIDE HCL 10 MG PO TABS
5.0000 mg | ORAL_TABLET | Freq: Three times a day (TID) | ORAL | Status: DC
  Administered 2017-04-30 – 2017-05-01 (×4): 5 mg via ORAL
  Filled 2017-04-30 (×4): qty 1

## 2017-04-30 MED ORDER — METOPROLOL TARTRATE 25 MG PO TABS
25.0000 mg | ORAL_TABLET | Freq: Two times a day (BID) | ORAL | Status: DC
  Administered 2017-04-30 – 2017-05-01 (×2): 25 mg
  Filled 2017-04-30 (×2): qty 1

## 2017-04-30 NOTE — Progress Notes (Signed)
Inpatient Diabetes Program Recommendations  AACE/ADA: New Consensus Statement on Inpatient Glycemic Control (2015)  Target Ranges:  Prepandial:   less than 140 mg/dL      Peak postprandial:   less than 180 mg/dL (1-2 hours)      Critically ill patients:  140 - 180 mg/dL   Lab Results  Component Value Date   GLUCAP 183 (H) 04/30/2017   HGBA1C 8.5 (H) 04/08/2017    Review of Glycemic Control  TF at 65/hour. Blood sugars elevated.  Inpatient Diabetes Program Recommendations:  Increase Lantus to 12 units QHS Change Novolog to 0-15 units Q4H  Will likely need TF coverage (2-3 units Q4H)  Will continue to follow.  Thank you. Lorenda Peck, RD, LDN, CDE Inpatient Diabetes Coordinator [phone]

## 2017-04-30 NOTE — Care Management Important Message (Signed)
Important Message  Patient Details  Name: Calvin Perez MRN: [id number] Date of Birth: Jun 08, 1941   Medicare Important Message Given:  Yes    Evann Koelzer Montine Circle 04/30/2017, 8:56 AM

## 2017-05-01 ENCOUNTER — Other Ambulatory Visit (HOSPITAL_COMMUNITY): Payer: Self-pay | Admitting: Radiology

## 2017-05-01 ENCOUNTER — Inpatient Hospital Stay (HOSPITAL_COMMUNITY): Payer: Medicare HMO

## 2017-05-01 ENCOUNTER — Other Ambulatory Visit: Payer: Self-pay | Admitting: Student

## 2017-05-01 LAB — GLUCOSE, CAPILLARY
GLUCOSE-CAPILLARY: 432 mg/dL — AB (ref 65–99)
GLUCOSE-CAPILLARY: 468 mg/dL — AB (ref 65–99)
GLUCOSE-CAPILLARY: 416 mg/dL — AB (ref 65–99)
GLUCOSE-CAPILLARY: 370 mg/dL — AB (ref 65–99)
GLUCOSE-CAPILLARY: 239 mg/dL — AB (ref 65–99)
Glucose-Capillary: 421 mg/dL — ABNORMAL HIGH (ref 65–99)
Glucose-Capillary: 130 mg/dL — ABNORMAL HIGH (ref 65–99)

## 2017-05-01 LAB — CBC WITH DIFFERENTIAL/PLATELET
BASOS ABS: 0.1 10*3/uL (ref 0.0–0.1)
BASOS PCT: 1 %
EOS PCT: 0 %
Eosinophils Absolute: 0 10*3/uL (ref 0.0–0.7)
HCT: 34.9 % — ABNORMAL LOW (ref 39.0–52.0)
Hemoglobin: 11.3 g/dL — ABNORMAL LOW (ref 13.0–17.0)
LYMPHS PCT: 14 %
Lymphs Abs: 1.5 10*3/uL (ref 0.7–4.0)
MCH: 26.5 pg (ref 26.0–34.0)
MCHC: 32.4 g/dL (ref 30.0–36.0)
MCV: 81.7 fL (ref 78.0–100.0)
MONO ABS: 1.2 10*3/uL — AB (ref 0.1–1.0)
Monocytes Relative: 12 %
Neutro Abs: 7.8 10*3/uL — ABNORMAL HIGH (ref 1.7–7.7)
Neutrophils Relative %: 74 %
PLATELETS: 107 10*3/uL — AB (ref 150–400)
RBC: 4.27 MIL/uL (ref 4.22–5.81)
RDW: 20.2 % — AB (ref 11.5–15.5)
WBC: 10.6 10*3/uL — ABNORMAL HIGH (ref 4.0–10.5)

## 2017-05-01 LAB — COMPREHENSIVE METABOLIC PANEL
ALT: 309 U/L — ABNORMAL HIGH (ref 17–63)
ANION GAP: 13 (ref 5–15)
AST: 763 U/L — AB (ref 15–41)
Albumin: 2.4 g/dL — ABNORMAL LOW (ref 3.5–5.0)
Alkaline Phosphatase: 575 U/L — ABNORMAL HIGH (ref 38–126)
BUN: 23 mg/dL — AB (ref 6–20)
CHLORIDE: 109 mmol/L (ref 101–111)
CO2: 23 mmol/L (ref 22–32)
Calcium: 10.2 mg/dL (ref 8.9–10.3)
Creatinine, Ser: 1.54 mg/dL — ABNORMAL HIGH (ref 0.61–1.24)
GFR, EST AFRICAN AMERICAN: 48 mL/min — AB (ref >60)
GFR, EST NON AFRICAN AMERICAN: 42 mL/min — AB (ref >60)
Glucose, Bld: 447 mg/dL — ABNORMAL HIGH (ref 65–99)
POTASSIUM: 4.1 mmol/L (ref 3.5–5.1)
Sodium: 145 mmol/L (ref 135–145)
Total Bilirubin: 6.1 mg/dL — ABNORMAL HIGH (ref 0.3–1.2)
Total Protein: 6.9 g/dL (ref 6.5–8.1)

## 2017-05-01 LAB — AMMONIA: AMMONIA: 52 umol/L — AB (ref 9–35)

## 2017-05-01 LAB — HEPATITIS C ANTIBODY (REFLEX)

## 2017-05-01 LAB — HEPATITIS B SURFACE ANTIGEN: Hepatitis B Surface Ag: NEGATIVE

## 2017-05-01 LAB — PROTIME-INR
INR: 2.02
PROTHROMBIN TIME: 23.2 s — AB (ref 11.4–15.2)

## 2017-05-01 LAB — HCV COMMENT:

## 2017-05-01 MED ORDER — JEVITY 1.2 CAL PO LIQD
1000.0000 mL | ORAL | Status: DC
  Filled 2017-05-01: qty 1000

## 2017-05-01 MED ORDER — INSULIN ASPART 100 UNIT/ML ~~LOC~~ SOLN
15.0000 [IU] | Freq: Once | SUBCUTANEOUS | Status: AC
  Administered 2017-05-01: 15 [IU] via SUBCUTANEOUS

## 2017-05-01 MED ORDER — FUROSEMIDE 10 MG/ML IJ SOLN
INTRAMUSCULAR | Status: AC
  Administered 2017-05-01: 40 mg via INTRAVENOUS
  Filled 2017-05-01: qty 4

## 2017-05-01 MED ORDER — INSULIN ASPART 100 UNIT/ML ~~LOC~~ SOLN
12.0000 [IU] | Freq: Once | SUBCUTANEOUS | Status: AC
  Administered 2017-05-01: 12 [IU] via SUBCUTANEOUS

## 2017-05-01 MED ORDER — INSULIN GLARGINE 100 UNIT/ML ~~LOC~~ SOLN: 20.0000 [IU] | Freq: Every day | SUBCUTANEOUS | Status: DC

## 2017-05-01 MED ORDER — SODIUM CHLORIDE 0.9 % IV SOLN
1.0000 mg/h | INTRAVENOUS | Status: DC | PRN
  Administered 2017-05-01: 1 mg/h via INTRAVENOUS
  Filled 2017-05-01 (×2): qty 10

## 2017-05-01 MED ORDER — FUROSEMIDE 10 MG/ML IJ SOLN
40.0000 mg | Freq: Once | INTRAMUSCULAR | Status: AC
  Administered 2017-05-01: 40 mg via INTRAVENOUS

## 2017-05-01 MED ORDER — SCOPOLAMINE 1 MG/3DAYS TD PT72
1.0000 | MEDICATED_PATCH | TRANSDERMAL | Status: DC
  Administered 2017-05-01: 1.5 mg via TRANSDERMAL
  Filled 2017-05-01: qty 1

## 2017-05-01 MED ORDER — INSULIN GLARGINE 100 UNIT/ML ~~LOC~~ SOLN
20.0000 [IU] | SUBCUTANEOUS | Status: AC
  Administered 2017-05-01: 20 [IU] via SUBCUTANEOUS
  Filled 2017-05-01: qty 0.2

## 2017-05-01 MED ORDER — NITROGLYCERIN 0.4 MG/HR TD PT24
0.4000 mg | MEDICATED_PATCH | Freq: Every day | TRANSDERMAL | Status: DC
  Filled 2017-05-01: qty 1

## 2017-05-01 NOTE — Progress Notes (Signed)
Paged Dr. Doylene Canard after not receiving a response from Dr. Radford Pax about pts CBG of [phone].

## 2017-05-01 NOTE — Progress Notes (Signed)
Late entry Ref: Dixie Dials, MD   Subjective:  Discussed care with family. They are recommending G tube and SNF.   Objective:  Vital Signs in the last 24 hours: Temp:  [97.6 F (36.4 C)-98.3 F (36.8 C)] 97.6 F (36.4 C) (07/31 0437) Pulse Rate:  [120-136] 129 (07/31 0945) Resp:  [17-20] 20 (07/31 0437) BP: (152-164)/(76-84) 161/84 (07/31 0945) SpO2:  [93 %-96 %] 94 % (07/31 0945) Weight:  [70 kg (154 lb 4.8 oz)] 70 kg (154 lb 4.8 oz) (07/31 0437)  Physical Exam: BP Readings from Last 1 Encounters:  05/01/17 (!) 161/84    Wt Readings from Last 1 Encounters:  05/01/17 70 kg (154 lb 4.8 oz)    Weight change: 1.315 kg (2 lb 14.4 oz) Body mass index is 27.33 kg/m. HEENT: [place]/AT, Eyes-Brown, PERL, EOMI, Conjunctiva-Pale pink, Sclera-Non-icteric Neck: No JVD, No bruit, Trachea midline. Lungs:  Clear, Bilateral. Cardiac:  Regular rhythm, normal S1 and S2, no S3. II/VI systolic murmur. Abdomen:  Soft, non-tender. BS present. Extremities:  No edema present. No cyanosis. No clubbing. CNS: AxOx2, Cranial nerves grossly intact, moves all 4 extremities.  Skin: Warm and dry.   Intake/Output from previous day: 07/30 0701 - 07/31 0700 In: 1688.5 [I.V.:1101.3; NG/GT:587.2] Out: 200 [Urine:200]    Lab Results: BMET    Component Value Date/Time   NA 143 04/30/2017 0532   NA 140 04/29/2017 0417   NA 139 04/28/2017 0427   K 4.0 04/30/2017 0532   K 3.8 04/29/2017 0417   K 3.7 04/28/2017 0427   CL 108 04/30/2017 0532   CL 107 04/29/2017 0417   CL 108 04/28/2017 0427   CO2 26 04/30/2017 0532   CO2 25 04/29/2017 0417   CO2 23 04/28/2017 0427   GLUCOSE 253 (H) 04/30/2017 0532   GLUCOSE 251 (H) 04/29/2017 0417   GLUCOSE 137 (H) 04/28/2017 0427   BUN 11 04/30/2017 0532   BUN 9 04/29/2017 0417   BUN 7 04/28/2017 0427   CREATININE 1.01 04/30/2017 0532   CREATININE 1.00 04/29/2017 0417   CREATININE 0.97 04/28/2017 0427   CALCIUM 9.6 04/30/2017 0532   CALCIUM 9.4 04/29/2017 0417    CALCIUM 9.3 04/28/2017 0427   GFRNONAA >60 04/30/2017 0532   GFRNONAA >60 04/29/2017 0417   GFRNONAA >60 04/28/2017 0427   GFRAA >60 04/30/2017 0532   GFRAA >60 04/29/2017 0417   GFRAA >60 04/28/2017 0427   CBC    Component Value Date/Time   WBC 9.5 04/24/2017 0521   RBC 3.50 (L) 04/24/2017 0521   HGB 9.3 (L) 04/24/2017 0521   HCT 26.7 (L) 04/24/2017 0521   PLT 213 04/24/2017 0521   MCV 76.3 (L) 04/24/2017 0521   MCH 26.6 04/24/2017 0521   MCHC 34.8 04/24/2017 0521   RDW 16.8 (H) 04/24/2017 0521   LYMPHSABS 1.2 04/07/2017 1732   MONOABS 0.9 04/07/2017 1732   EOSABS 0.1 04/07/2017 1732   BASOSABS 0.0 04/07/2017 1732   HEPATIC Function Panel  Recent Labs  04/08/17 0000 04/11/17 0730 04/28/17 0427  PROT 7.0 7.5 6.8   HEMOGLOBIN A1C No components found for: HGA1C,  MPG CARDIAC ENZYMES Lab Results  Component Value Date   TROPONINI 4.14 ([place]) 09/17/2013   BNP No results for input(s): PROBNP in the last 8760 hours. TSH  Recent Labs  04/08/17 0124  TSH 1.020   CHOLESTEROL  Recent Labs  08/31/16 1012 01/10/17 0849 04/08/17 0124  CHOL 250* 114 102    Scheduled Meds: . amLODipine  5  mg Per Tube BID  . aspirin  81 mg Per Tube Daily  . atorvastatin  80 mg Per Tube Daily  . furosemide  40 mg Intravenous Once  . gabapentin  100 mg Per Tube QHS  . heparin  5,000 Units Subcutaneous Q8H  . insulin aspart  0-15 Units Subcutaneous TID WC  . [START ON 2017/05/04] insulin glargine  20 Units Subcutaneous Daily  . losartan  100 mg Per Tube Daily  . mouth rinse  15 mL Mouth Rinse BID  . metoCLOPramide  5 mg Oral TID AC & HS  . metoprolol tartrate  25 mg Per Tube BID  . mirtazapine  15 mg Per Tube QHS  . nitroGLYCERIN  0.4 mg Transdermal Daily  . pantoprazole sodium  40 mg Per Tube Q1200   Continuous Infusions: . dextrose 5 % and 0.9% NaCl 25 mL/hr at 04/30/17 0538  . feeding supplement (JEVITY 1.2 CAL)     PRN Meds:.bisacodyl, nitroGLYCERIN, ondansetron  (ZOFRAN) IV, RESOURCE THICKENUP CLEAR  Assessment/Plan: SIADH Hyponatremia-resolved CAD CABG S/P RCA stent Old stroke with right sided weakness Dysphagia Hypertension Type II DM, II Anemia of chronic disease Protein calorie malnutrition  Daughter wants G tube and SNF. Wife wants oral feeding and sent home when ready IR order for G-tube and Liver biopsy on hold for now. Will retry oral feeding per patient request.    LOS: 7 days    Dixie Dials  MD  05/01/2017, 10:14 AM

## 2017-05-01 NOTE — Progress Notes (Signed)
Ref: Dixie Dials, MD   Subjective:  Patient has tachypnea and tachycardia. EKG junctional tachycardia or sinus tachycardia (P on T). Patient was alert in AM now somewhat sleepy but says I am OK. Elevated blood sugar levels earlier. Some attempts to oral feeding by patient.  Objective:  Vital Signs in the last 24 hours: Temp:  [97.6 F (36.4 C)-98.8 F (37.1 C)] 98.8 F (37.1 C) (07/31 0945) Pulse Rate:  [120-136] 129 (07/31 0945) Resp:  [17-20] 20 (07/31 0437) BP: (152-164)/(76-84) 161/84 (07/31 0945) SpO2:  [93 %-96 %] 94 % (07/31 0945) Weight:  [70 kg (154 lb 4.8 oz)] 70 kg (154 lb 4.8 oz) (07/31 0437)  Physical Exam: BP Readings from Last 1 Encounters:  05/01/17 (!) 161/84    Wt Readings from Last 1 Encounters:  05/01/17 70 kg (154 lb 4.8 oz)    Weight change: 1.315 kg (2 lb 14.4 oz) Body mass index is 27.33 kg/m. HEENT: /AT, Eyes-Brown, PERL, EOMI, Conjunctiva-Pale pink, Sclera-Non-icteric Neck: No JVD, No bruit, Trachea midline. Lungs:  Clear, Bilateral. Cardiac:  Regular rhythm, normal S1 and S2, no S3. II/VI systolic murmur. Abdomen:  Soft, non-tender. BS present. Extremities:  No edema present. No cyanosis. No clubbing. CNS: AxOx1, Cranial nerves grossly intact, moves all 4 extremities.  Skin: Warm and dry.   Intake/Output from previous day: 07/30 0701 - 07/31 0700 In: 1688.5 [I.V.:1101.3; NG/GT:587.2] Out: 200 [Urine:200]    Lab Results: BMET    Component Value Date/Time   NA 143 04/30/2017 0532   NA 140 04/29/2017 0417   NA 139 04/28/2017 0427   K 4.0 04/30/2017 0532   K 3.8 04/29/2017 0417   K 3.7 04/28/2017 0427   CL 108 04/30/2017 0532   CL 107 04/29/2017 0417   CL 108 04/28/2017 0427   CO2 26 04/30/2017 0532   CO2 25 04/29/2017 0417   CO2 23 04/28/2017 0427   GLUCOSE 253 (H) 04/30/2017 0532   GLUCOSE 251 (H) 04/29/2017 0417   GLUCOSE 137 (H) 04/28/2017 0427   BUN 11 04/30/2017 0532   BUN 9 04/29/2017 0417   BUN 7 04/28/2017 0427   CREATININE 1.01 04/30/2017 0532   CREATININE 1.00 04/29/2017 0417   CREATININE 0.97 04/28/2017 0427   CALCIUM 9.6 04/30/2017 0532   CALCIUM 9.4 04/29/2017 0417   CALCIUM 9.3 04/28/2017 0427   GFRNONAA >60 04/30/2017 0532   GFRNONAA >60 04/29/2017 0417   GFRNONAA >60 04/28/2017 0427   GFRAA >60 04/30/2017 0532   GFRAA >60 04/29/2017 0417   GFRAA >60 04/28/2017 0427   CBC    Component Value Date/Time   WBC 9.5 04/24/2017 0521   RBC 3.50 (L) 04/24/2017 0521   HGB 9.3 (L) 04/24/2017 0521   HCT 26.7 (L) 04/24/2017 0521   PLT 213 04/24/2017 0521   MCV 76.3 (L) 04/24/2017 0521   MCH 26.6 04/24/2017 0521   MCHC 34.8 04/24/2017 0521   RDW 16.8 (H) 04/24/2017 0521   LYMPHSABS 1.2 04/07/2017 1732   MONOABS 0.9 04/07/2017 1732   EOSABS 0.1 04/07/2017 1732   BASOSABS 0.0 04/07/2017 1732   HEPATIC Function Panel  Recent Labs  04/08/17 0000 04/11/17 0730 04/28/17 0427  PROT 7.0 7.5 6.8   HEMOGLOBIN A1C No components found for: HGA1C,  MPG CARDIAC ENZYMES Lab Results  Component Value Date   TROPONINI 4.14 ([place]) 09/17/2013   BNP No results for input(s): PROBNP in the last 8760 hours. TSH  Recent Labs  04/08/17 0124  TSH 1.020   CHOLESTEROL  Recent Labs  08/31/16 1012 01/10/17 0849 04/08/17 0124  CHOL 250* 114 102    Scheduled Meds: . amLODipine  5 mg Per Tube BID  . aspirin  81 mg Per Tube Daily  . atorvastatin  80 mg Per Tube Daily  . gabapentin  100 mg Per Tube QHS  . heparin  5,000 Units Subcutaneous Q8H  . insulin aspart  0-15 Units Subcutaneous TID WC  . [START ON May 23, 2017] insulin glargine  20 Units Subcutaneous Daily  . losartan  100 mg Per Tube Daily  . mouth rinse  15 mL Mouth Rinse BID  . metoCLOPramide  5 mg Oral TID AC & HS  . metoprolol tartrate  25 mg Per Tube BID  . mirtazapine  15 mg Per Tube QHS  . nitroGLYCERIN  0.4 mg Transdermal Daily  . pantoprazole sodium  40 mg Per Tube Q1200   Continuous Infusions: . dextrose 5 % and 0.9% NaCl 25  mL/hr at 04/30/17 0538  . 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 PCXR IV lasix. Resume telemetry. Unable to contact family at this time.   LOS: 7 days    Dixie Dials  MD  05/01/2017, 10:24 AM

## 2017-05-01 NOTE — Progress Notes (Signed)
SWOT RN's in room to assist with med pass. Pt found increased lethargy, heart rate irregular at 134, BS 421 tachypnea. PT RN notified and MD Dr. Doylene Canard notified. Orders given. MD at bedside within 5 mins. Will continue to monitor.

## 2017-05-01 NOTE — Progress Notes (Signed)
  Speech Language Pathology  Patient Details Name: Calvin Perez MRN: [id number] DOB: 11/28/1940 Today's Date: 05/01/2017 Time:  -        Reviewed chart, spoke with RN re: po nutrition. No family present. No po intake recorded in chart since 7/28 where he consumed water. RN not aware that he has consumed solids or liquids via oral cavity and nutrition via PEG.  Pt with significant esophageal stasis observed during MBS when esophagus scanned. Barium esophagus confirmed dysmotility. GI MD consulted documented "this is not amenable to endoscopic therapy or medication." Pt's diet texture was upgraded to soft from liquids by MD. Liquid textures would transit esophagus easier with less stasis. At this time ST will sign off as not much more to offer.      [name] [place].[employer] [phone]

## 2017-05-01 NOTE — Progress Notes (Signed)
Pt. Appeared to be in some respiratory distress, labored breathing and  Tachypnic. MD notified and SWOT nurses at bedside to assist with patient. MD gave verbal orders. Will continue to monitor.

## 2017-05-01 NOTE — Progress Notes (Signed)
Inpatient Diabetes Program Recommendations  AACE/ADA: New Consensus Statement on Inpatient Glycemic Control (2015)  Target Ranges:  Prepandial:   less than 140 mg/dL      Peak postprandial:   less than 180 mg/dL (1-2 hours)      Critically ill patients:  140 - 180 mg/dL   Lab Results  Component Value Date   GLUCAP 468 (H) 05/01/2017   HGBA1C 8.5 (H) 04/08/2017    Review of Glycemic Control  Blood sugars uncontrolled. TF at 65/hour. CBGs 183 - 468 mg/dL in past 24H.  Recommendations: Add Novolog 3 units Q4H for TF coverage.  Will follow.  Thank you. Lorenda Peck, RD, LDN, CDE Inpatient Diabetes Coordinator [phone]

## 2017-05-01 NOTE — Progress Notes (Signed)
Paged Turner MD concerning pts CBG of [phone] with no response.

## 2017-05-01 NOTE — Progress Notes (Signed)
Pt. Appears to not be improving. Family consulted at bedside with MD, notified that patient is in multisystem organ failure. Patient is now a DNR and will receive comfort care.

## 2017-05-01 NOTE — Progress Notes (Signed)
CBG - 468, notified MD-  - He order 15 U of Novolog and Lantus  20 U. He also ordered a CXR and cardiac monitoring.

## 2017-05-01 NOTE — Progress Notes (Addendum)
Ordered 3 L Trezevant sats were sitting at 88% --> Now at 97%  MD approved 3L of [place] of O2

## 2017-05-01 NOTE — Progress Notes (Signed)
Physical Therapy Discharge Patient Details Name: Calvin Perez MRN: [id number] DOB: July 12, 1941 Today's Date: 05/01/2017 Time:  -     Patient discharged from PT services secondary to medical decline - will need to re-order PT to resume therapy services.  Please see latest therapy progress note for current level of functioning and progress toward goals.    Progress and discharge plan discussed with patient and/or caregiver: Patient/Caregiver agrees with plan  Therapist spoke with pt's son who requested PT sign off.      [place] April, PTA Pager: [phone]   05/01/2017, 3:12 PM

## 2017-05-02 DEATH — deceased

## 2017-06-02 NOTE — Progress Notes (Signed)
Ref: Dixie Dials, MD   Subjective:  Patient made DNR per family request post recommendation. Abnormal LFTs, ammonia level and renal function. Comfort care only. Discontinue feedings and medications except scopolamine patch, morphine and insulin if needed. DC telemetry. Barely wakes up to calling and mildly shaking shoulder.  Objective:  Vital Signs in the last 24 hours: Temp:  [98.8 F (37.1 C)-101.1 F (38.4 C)] 101.1 F (38.4 C) (07/31 2211) Pulse Rate:  [45-129] 115 (07/31 2211) Cardiac Rhythm: Asystole (08/01 0410) Resp:  [10-22] 10 (07/31 2211) BP: (73-161)/(36-111) 73/36 (07/31 2211) SpO2:  [66 %-100 %] 100 % (07/31 2211)  Physical Exam: BP Readings from Last 1 Encounters:  05/01/17 (!) 73/36    Wt Readings from Last 1 Encounters:  05/01/17 70 kg (154 lb 4.8 oz)    Weight change:  Body mass index is 27.33 kg/m. HEENT: [place]/AT, Eyes-Brown, PERL, EOMI, Conjunctiva-Pink, Sclera-icteric. Neck: No JVD, No bruit, Trachea midline. Lungs:  Clear, Bilateral. Cardiac:  Regular rhythm, normal S1 and S2, no S3. II/VI systolic murmur. Abdomen:  Soft, minimal RUQ-tenderness. BS present. Extremities:  No edema present. No cyanosis. No clubbing. CNS: AxOx1, Cranial nerves grossly intact, moves hands randomly. Skin: Warm and dry.  Intake/Output from previous day: 07/31 0701 - 08/01 0700 In: 875.4 [I.V.:0.1; NG/GT:875.3] Out: 800 [Urine:800]    Lab Results: BMET    Component Value Date/Time   NA 145 05/01/2017 1103   NA 143 04/30/2017 0532   NA 140 04/29/2017 0417   K 4.1 05/01/2017 1103   K 4.0 04/30/2017 0532   K 3.8 04/29/2017 0417   CL 109 05/01/2017 1103   CL 108 04/30/2017 0532   CL 107 04/29/2017 0417   CO2 23 05/01/2017 1103   CO2 26 04/30/2017 0532   CO2 25 04/29/2017 0417   GLUCOSE 447 (H) 05/01/2017 1103   GLUCOSE 253 (H) 04/30/2017 0532   GLUCOSE 251 (H) 04/29/2017 0417   BUN 23 (H) 05/01/2017 1103   BUN 11 04/30/2017 0532   BUN 9 04/29/2017 0417    CREATININE 1.54 (H) 05/01/2017 1103   CREATININE 1.01 04/30/2017 0532   CREATININE 1.00 04/29/2017 0417   CALCIUM 10.2 05/01/2017 1103   CALCIUM 9.6 04/30/2017 0532   CALCIUM 9.4 04/29/2017 0417   GFRNONAA 42 (L) 05/01/2017 1103   GFRNONAA >60 04/30/2017 0532   GFRNONAA >60 04/29/2017 0417   GFRAA 48 (L) 05/01/2017 1103   GFRAA >60 04/30/2017 0532   GFRAA >60 04/29/2017 0417   CBC    Component Value Date/Time   WBC 10.6 (H) 05/01/2017 1103   RBC 4.27 05/01/2017 1103   HGB 11.3 (L) 05/01/2017 1103   HCT 34.9 (L) 05/01/2017 1103   PLT 107 (L) 05/01/2017 1103   MCV 81.7 05/01/2017 1103   MCH 26.5 05/01/2017 1103   MCHC 32.4 05/01/2017 1103   RDW 20.2 (H) 05/01/2017 1103   LYMPHSABS 1.5 05/01/2017 1103   MONOABS 1.2 (H) 05/01/2017 1103   EOSABS 0.0 05/01/2017 1103   BASOSABS 0.1 05/01/2017 1103   HEPATIC Function Panel  Recent Labs  04/11/17 0730 04/28/17 0427 05/01/17 1103  PROT 7.5 6.8 6.9   HEMOGLOBIN A1C No components found for: HGA1C,  MPG CARDIAC ENZYMES Lab Results  Component Value Date   TROPONINI 4.14 ([place]) 09/17/2013   BNP No results for input(s): PROBNP in the last 8760 hours. TSH  Recent Labs  04/08/17 0124  TSH 1.020   CHOLESTEROL  Recent Labs  08/31/16 1012 01/10/17 0849 04/08/17 0124  CHOL 250* 114 102    Scheduled Meds: . amLODipine  5 mg Per Tube BID  . mouth rinse  15 mL Mouth Rinse BID  . nitroGLYCERIN  0.4 mg Transdermal Daily  . scopolamine  1 patch Transdermal Q72H   Continuous Infusions: . 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 Comfort care. DC telemetry and most medications and feeding.   LOS: 7 days     Dixie Dials  MD  05-25-2017, 7:41 AM

## 2017-06-02 NOTE — Progress Notes (Signed)
The patient's son called for the nurse to come to the patient's bedside.  Patient deceased.  Time of death was pronounced by two nurses (this nurse and Stephanie Coup) to be at 04:10 on 2017-05-10.  No breaths or heart beats were heard when auscultated; No pulses were felt when palpated.  The patient's wife Education officer, museum) was notified that patient had passed; emotional support given.  [facility] and Dr. Doylene Canard were notified.  Dr. Doylene Canard to sign the patient's death. The patient's body was cleaned, and all lines were pulled; the patient was then transported to morgue.

## 2017-06-02 NOTE — Progress Notes (Signed)
This RN wasted 238 mL of morphine with Will Bonnet, RN.

## 2017-06-02 NOTE — Death Summary Note (Signed)
DEATH SUMMARY   Patient Details  Name: Calvin Perez MRN: [id number] DOB: 1941-09-28  Admission/Discharge Information   Admit Date:  04-30-17  Date of Death: Date of Death: 2017/05/09  Time of Death: Time of Death: 0410  Length of Stay: 8  Referring Physician: Dixie Dials, MD   Reason(s) for Hospitalization  Severe hyponatremia  SIADH  Diagnoses  Preliminary cause of death: Acute liver failure Secondary Diagnoses (including complications and co-morbidities):  Active Problems:   SIADH (syndrome of inappropriate ADH production) (HCC)   Acute renal failure   Acute hepatic encephalopathy   Jaundice   Liver metastasis, primary cancer unknown   CAD   CABG   Dysphagia   Hypertension   DM, II   Anemia of chronic disease   Protein calorie malnutrition, moderate   Old stroke with right sided weakness     Brief Hospital Course (including significant findings, care, treatment, and services provided and events leading to death)  Calvin Perez is a 76 y.o. year old male who presented with weakness and confusion for 3 days prior to admission. He had similar symptoms 2 weeks ago and was diagnosed with SIADH. He had nausea, vomiting, decreased appetite and recurrent falls leading to increased assistance in ambulation. His sodium level was down to 110 meq. He did not respond to IV saline for 3 days then sodium level normalized with one dose of Samsca (Tolvaptan).  GI consult was obtained for dysphagia. MRI of liver revealed widespread liver metastasis.  Patient's liver functions were deteriorating. NG tube feedings were started. After few discussions over 3 days family agreed to DNR and comfort care. Biopsy of liver and G tube placement orders were cancelled. Most medications and telemetry were also cancelled.  Patient expired this AM at 4:10 AM and appropriate disposition was made to funeral home of choice.   Pertinent Labs and Studies  Significant Diagnostic Studies Dg Chest 2  View  Result Date: 04/10/2017 CLINICAL DATA:  Weakness and fatigue for 10 days, history hypertension, diabetes mellitus, coronary artery disease post stenting EXAM: CHEST  2 VIEW COMPARISON:  09/17/2013 FINDINGS: Normal heart size post CABG. Mediastinal contours and pulmonary vascularity normal. Scarring at RIGHT base. Lungs otherwise clear. No acute infiltrate, pleural effusion or pneumothorax. RIGHT carotid stent. No acute osseous findings. IMPRESSION: Post CABG and RIGHT carotid stenting. RIGHT basilar scarring without acute infiltrate. Electronically Signed   By: Lavonia Dana M.D.   On: 04/10/2017 11:16   Ct Head Wo Contrast  Result Date: 04/24/2017 CLINICAL DATA:  Multiple falls. EXAM: CT HEAD WITHOUT CONTRAST CT CERVICAL SPINE WITHOUT CONTRAST TECHNIQUE: Multidetector CT imaging of the head and cervical spine was performed following the standard protocol without intravenous contrast. Multiplanar CT image reconstructions of the cervical spine were also generated. COMPARISON:  Head CT 04/07/2017. FINDINGS: CT HEAD FINDINGS Brain: Atrophy, unchanged from prior exam stable chronic small vessel ischemia. Encephalomalacia in the left occipital lobe is unchanged. Small bilateral basal gangliar lacunar infarcts are unchanged. No evidence of acute infarction, hemorrhage, hydrocephalus, extra-axial collection or mass lesion/mass effect. Vascular: Atherosclerosis of skullbase vasculature. No hyperdense vessel. Skull: No skull fracture.  No focal lesion. Sinuses/Orbits: No acute finding. Sclerosis about the mastoid air cells with minimal opacification bilaterally, unchanged. No paranasal sinus fluid levels. Other: None. CT CERVICAL SPINE FINDINGS Alignment: No traumatic subluxation.  Facets are normally aligned. Skull base and vertebrae: No acute fracture. Vertebral body heights are maintained. The dens and skull base are intact. Soft tissues and spinal canal:  No prevertebral fluid or swelling. No visible canal  hematoma. Disc levels: Diffuse disc space narrowing and endplate spurring. Large bulky anterior osteophytes from C3-C4, C4-C5, and C5-C6. Scattered facet arthropathy. Upper chest: No acute abnormality. Other: Carotid calcifications with carotid stent on the right. IMPRESSION: 1. No acute intracranial abnormality. No skull fracture. Stable atrophy, chronic and remote ischemia. 2. Multilevel degenerative change in the cervical spine without acute fracture or subluxation. Bulky anterior osteophytes from C3-C4 through C5-C6. Electronically Signed   By: Jeb Levering M.D.   On: 04/24/2017 01:58   Ct Head Wo Contrast  Result Date: 04/07/2017 CLINICAL DATA:  Right lower extremity numbness and tingling EXAM: CT HEAD WITHOUT CONTRAST TECHNIQUE: Contiguous axial images were obtained from the base of the skull through the vertex without intravenous contrast. COMPARISON:  Brain MRI Feb 01, 2012; brain MRI January 14, 2009; head CT January 13, 2009 FINDINGS: Brain: There is moderate diffuse atrophy. There is no apparent intracranial mass, hemorrhage, extra-axial fluid collection, or midline shift. There is evidence of what appears to be prior infarct in the left medial occipital lobe, not present on most recent available studies. There is evidence of a prior infarct in the right thalamus. A smaller infarct is noted in the left thalamus. Elsewhere, there is small vessel disease throughout the centra semiovale bilaterally. There is no appreciable acute infarct on the current examination. Vascular: There is no appreciable hyperdense vessel. There is calcification in each carotid siphon. There is also calcification in each distal vertebral artery. Skull: The bony calvarium appears intact. Sinuses/Orbits: There is mucosal thickening in multiple ethmoid air cells bilaterally. Visualized paranasal sinuses elsewhere clear. Orbits appear symmetric bilaterally. Other: There is chronic mastoid air cell thickening bilaterally. IMPRESSION:  Atrophy with periventricular small vessel disease. Old appearing infarct in the medial left occipital lobe, not present on most recent prior studies. Prior appearing small infarcts in each thalamus. No acute infarct is demonstrable by CT. No mass, hemorrhage, or extra-axial fluid collection. There are multiple foci of arterial vascular calcification. There is ethmoid sinus disease bilaterally. There is chronic mastoid air cell thickening bilaterally. Electronically Signed   By: Lowella Grip III M.D.   On: 04/07/2017 18:52   Ct Cervical Spine Wo Contrast  Result Date: 04/24/2017 CLINICAL DATA:  Multiple falls. EXAM: CT HEAD WITHOUT CONTRAST CT CERVICAL SPINE WITHOUT CONTRAST TECHNIQUE: Multidetector CT imaging of the head and cervical spine was performed following the standard protocol without intravenous contrast. Multiplanar CT image reconstructions of the cervical spine were also generated. COMPARISON:  Head CT 04/07/2017. FINDINGS: CT HEAD FINDINGS Brain: Atrophy, unchanged from prior exam stable chronic small vessel ischemia. Encephalomalacia in the left occipital lobe is unchanged. Small bilateral basal gangliar lacunar infarcts are unchanged. No evidence of acute infarction, hemorrhage, hydrocephalus, extra-axial collection or mass lesion/mass effect. Vascular: Atherosclerosis of skullbase vasculature. No hyperdense vessel. Skull: No skull fracture.  No focal lesion. Sinuses/Orbits: No acute finding. Sclerosis about the mastoid air cells with minimal opacification bilaterally, unchanged. No paranasal sinus fluid levels. Other: None. CT CERVICAL SPINE FINDINGS Alignment: No traumatic subluxation.  Facets are normally aligned. Skull base and vertebrae: No acute fracture. Vertebral body heights are maintained. The dens and skull base are intact. Soft tissues and spinal canal: No prevertebral fluid or swelling. No visible canal hematoma. Disc levels: Diffuse disc space narrowing and endplate spurring.  Large bulky anterior osteophytes from C3-C4, C4-C5, and C5-C6. Scattered facet arthropathy. Upper chest: No acute abnormality. Other: Carotid calcifications with carotid stent on  the right. IMPRESSION: 1. No acute intracranial abnormality. No skull fracture. Stable atrophy, chronic and remote ischemia. 2. Multilevel degenerative change in the cervical spine without acute fracture or subluxation. Bulky anterior osteophytes from C3-C4 through C5-C6. Electronically Signed   By: Jeb Levering M.D.   On: 04/24/2017 01:58   Mr Calvin Perez Head Wo Contrast  Result Date: 04/27/2017 CLINICAL DATA:  Weakness and confusion for 3 days. Right leg weakness. EXAM: MRI HEAD WITHOUT CONTRAST MRA HEAD WITHOUT CONTRAST TECHNIQUE: Multiplanar, multiecho pulse sequences of the brain and surrounding structures were obtained without intravenous contrast. Angiographic images of the head were obtained using MRA technique without contrast. COMPARISON:  CT of the head and cervical spine 04/24/2017 hand 04/07/2017. FINDINGS: MRI HEAD FINDINGS Brain: A remote infarct involving the inferior posterior left occipital lobe is again seen. No acute infarct, hemorrhage, or mass lesion is present. Moderate generalized atrophy and white matter disease is noted bilaterally. Remote lacunar infarcts are present within the thalami bilaterally. Dilated perivascular spaces are present within the basal ganglia. No significant extra-axial fluid collection is present. Prominent T2 signal is noted anteriorly in the pons bilaterally, worse on the left. Cerebellar atrophy is noted. Vascular: Flow is present in the major intracranial arteries. Skull and upper cervical spine: The skullbase is within normal limits. The craniocervical junction is normal. Moderate central canal stenosis is noted at C3-4 with progression. Midline sagittal structures are otherwise unremarkable. There is diffuse thinning of the corpus callosum. Sinuses/Orbits: Bilateral mastoid effusions  are present. The paranasal sinuses are clear. Bilateral lens replacements are noted. Globes and orbits are otherwise within normal limits. MRA HEAD FINDINGS Internal carotid arteries are within normal limits from the high cervical segments through the ICA termini bilaterally. The A1 and M1 segments are normal. Anterior communicating artery is patent. The MCA bifurcations are intact bilaterally. ACA and MCA branch vessels are within normal limits. The vertebral arteries are codominant. The basilar artery is normal. Both posterior cerebral arteries originate from basilar tip. Tandem moderate stenoses are present within the right P2 segment. Branch vessels demonstrate flow bilaterally with moderate distal attenuation. IMPRESSION: 1. No acute intracranial abnormality. 2. Advanced atrophy and white matter disease likely reflects the sequela of chronic microvascular ischemia. 3. Remote infarct in the anterior pons. 4. Remote infarcts involving the bowel my bilaterally. 5. MRA demonstrates moderate right P2 segment stenoses can't disproportionate branch vessel disease in the PCA circulation compared to the anterior circulation. 6. No significant proximal stenosis, aneurysm, or branch vessel occlusion within the anterior circulation. Electronically Signed   By: San Morelle M.D.   On: 04/27/2017 12:40   [name] Contrast  Result Date: 04/27/2017 CLINICAL DATA:  Weakness and confusion for 3 days. Right leg weakness. EXAM: MRI HEAD WITHOUT CONTRAST MRA HEAD WITHOUT CONTRAST TECHNIQUE: Multiplanar, multiecho pulse sequences of the brain and surrounding structures were obtained without intravenous contrast. Angiographic images of the head were obtained using MRA technique without contrast. COMPARISON:  CT of the head and cervical spine 04/24/2017 hand 04/07/2017. FINDINGS: MRI HEAD FINDINGS Brain: A remote infarct involving the inferior posterior left occipital lobe is again seen. No acute infarct, hemorrhage, or  mass lesion is present. Moderate generalized atrophy and white matter disease is noted bilaterally. Remote lacunar infarcts are present within the thalami bilaterally. Dilated perivascular spaces are present within the basal ganglia. No significant extra-axial fluid collection is present. Prominent T2 signal is noted anteriorly in the pons bilaterally, worse on the left. Cerebellar atrophy is noted. Vascular:  Flow is present in the major intracranial arteries. Skull and upper cervical spine: The skullbase is within normal limits. The craniocervical junction is normal. Moderate central canal stenosis is noted at C3-4 with progression. Midline sagittal structures are otherwise unremarkable. There is diffuse thinning of the corpus callosum. Sinuses/Orbits: Bilateral mastoid effusions are present. The paranasal sinuses are clear. Bilateral lens replacements are noted. Globes and orbits are otherwise within normal limits. MRA HEAD FINDINGS Internal carotid arteries are within normal limits from the high cervical segments through the ICA termini bilaterally. The A1 and M1 segments are normal. Anterior communicating artery is patent. The MCA bifurcations are intact bilaterally. ACA and MCA branch vessels are within normal limits. The vertebral arteries are codominant. The basilar artery is normal. Both posterior cerebral arteries originate from basilar tip. Tandem moderate stenoses are present within the right P2 segment. Branch vessels demonstrate flow bilaterally with moderate distal attenuation. IMPRESSION: 1. No acute intracranial abnormality. 2. Advanced atrophy and white matter disease likely reflects the sequela of chronic microvascular ischemia. 3. Remote infarct in the anterior pons. 4. Remote infarcts involving the bowel my bilaterally. 5. MRA demonstrates moderate right P2 segment stenoses can't disproportionate branch vessel disease in the PCA circulation compared to the anterior circulation. 6. No significant  proximal stenosis, aneurysm, or branch vessel occlusion within the anterior circulation. Electronically Signed   By: San Morelle M.D.   On: 04/27/2017 12:40   Ct Abdomen Pelvis W Contrast  Result Date: 04/10/2017 CLINICAL DATA:  Anemia and decreased appetite. EXAM: CT ABDOMEN AND PELVIS WITH CONTRAST TECHNIQUE: Multidetector CT imaging of the abdomen and pelvis was performed using the standard protocol following bolus administration of intravenous contrast. CONTRAST:  173mL ISOVUE-300 IOPAMIDOL (ISOVUE-300) INJECTION 61% COMPARISON:  None. FINDINGS: Lower chest: Coronary artery calcifications, patient is post CABG. Scattered linear atelectasis in the lung bases. Hepatobiliary: Patchy hepatic enhancement with multiple low-density lesions scattered throughout the liver. Lesions are ill-defined, largest measuring 14 mm. Suggestion of peripheral enhancement of some of these lesions which suggests hemangiomas, however incompletely characterized. Gallbladder physiologically distended, no calcified stone. No biliary dilatation. Pancreas: Parenchymal atrophy. No ductal dilatation or inflammation. Spleen: Normal in size without focal abnormality. Small splenule anteriorly. Adrenals/Urinary Tract: No adrenal nodule. There is no perinephric edema, there is symmetric prominence of both renal collecting systems. Both ureters are prominent to the bladder insertion. No urolithiasis. There is asymmetric bladder wall thickening involving the left urinary bladder up to 6 mm, with moderate surrounding soft tissue stranding. Sigmoid colon abuts the bladder dome, however are fat plane is preserved and stranding appears centered on the bladder rather than the colon. Stomach/Bowel: Stomach physiologically distended. Small duodenum diverticulum. No small bowel inflammation, obstruction or wall thickening. Enteric contrast reaches the cecum. The appendix is air-filled without periappendiceal inflammation. Small to moderate  colonic stool burden. Sigmoid colon abuts the left aspect of the urinary bladder, however fat plane is preserved. Question of irregular wall thickening of the rectum anteriorly. Vascular/Lymphatic: Arterial vascular calcifications. Pelvic adenopathy, greatest in the left iliac stations. Enlarged left external iliac node measures 3.8 x 1.6 cm image 80 series 3. External iliac adenopathy within 1.4 cm node image 74 series 3. Multiple additional prominent left common and external iliac nodes are seen. Left common iliac vein is not well-defined. Left retroperitoneal node at the iliac bifurcation measures 10 mm image 54 series 3. There is a 12 mm aortocaval node image 44. Additional smaller retroperitoneal nodes are seen. Enlarged right external iliac nodes, for example  measuring 10 mm short axis image 74. Reproductive: Prostate gland appears heterogeneous but normal in size. Other: No ascites. No free air. No intra-abdominal abscess. Soft tissue densities and air in the anterior abdominal wall consistent with subcutaneous injections. Musculoskeletal: No lytic or blastic osseous lesions. IMPRESSION: 1. Pelvic and retroperitoneal adenopathy highly suspicious for metastatic disease. Suspect primary being left aspect of the urinary bladder where there is irregular wall thickening and adjacent soft tissue stranding. Alternatively, suggestion of irregular anterior rectal wall thickening could be primary rectal malignancy. Recommend direct visualization of both of these regions with cystoscopy and sigmoidoscopy/colonoscopy. 2. There is prominence of both renal collecting systems without Jedi hydronephrosis. 3. Low-density lesions scattered throughout the liver, nonspecific. These may reflect hemangiomas, however in the setting of possible malignancy, metastasis rule out recommended. PET-CT versus hepatic protocol MRI based on direct visualization results. 4. Advanced aortic atherosclerosis. 5. Left external iliac adenopathy  may cause mass-effect on the left external iliac vein which is not well-defined, consider lower extremity duplex for evaluation of DVT. These results will be called to the ordering clinician or representative by the Radiologist Assistant, and communication documented in the PACS or zVision Dashboard. Electronically Signed   By: Jeb Levering M.D.   On: 04/10/2017 19:53   Dg Esophagus  Result Date: 04/27/2017 CLINICAL DATA:  Evaluate for stricture. EXAM: ESOPHOGRAM/BARIUM SWALLOW TECHNIQUE: Single contrast examination was performed using  thin barium. FLUOROSCOPY TIME:  Fluoroscopy Time:  1 minutes 12 second Radiation Exposure Index (if provided by the fluoroscopic device): n/a Number of Acquired Spot Images: 0 COMPARISON:  None. FINDINGS: Modified limited exam was performed with the patient in the [place] orientation. The patient was only able to take a few sips of the contrast material. An adequate volume of contrast was ingested however to confirm patency of the esophagus. No high-grade stricture or mass identified. Moderate to advanced esophageal dysmotility noted with multiple non propulsive tertiary waves. IMPRESSION: 1. No evidence for esophageal obstruction. No high-grade stricture or mass noted. 2. Esophageal dysmotility. Electronically Signed   By: Kerby Moors M.D.   On: 04/27/2017 14:54   Mr Liver W Wo Contrast  Result Date: 04/28/2017 CLINICAL DATA:  Followup abnormal CT of the liver. EXAM: MRI ABDOMEN WITHOUT AND WITH CONTRAST TECHNIQUE: Multiplanar multisequence MR imaging of the abdomen was performed both before and after the administration of intravenous contrast. CONTRAST:  85mL MULTIHANCE GADOBENATE DIMEGLUMINE 529 MG/ML IV SOLN COMPARISON:  CT from 04/10/2017 FINDINGS: Lower chest: Small bilateral pleural effusions. Hepatobiliary: There are innumerable T2 hyperintense and T1 hypointense lesions identified throughout both lobes of the liver which are highly suspicious for  widespread metastatic disease. The post-contrast imaging is essentially nondiagnostic due to motion artifact. Nevertheless, the appearance would be highly unusual for benign process such as multiple liver hemangiomas. Mild diffuse gallbladder wall edema. No gallstones. No biliary dilatation identified. Pancreas: No mass, inflammatory changes, or other parenchymal abnormality identified. Spleen:  Within normal limits in size and appearance. Adrenals/Urinary Tract: The adrenal glands appear normal. Unremarkable appearance of the kidneys. Stomach/Bowel: Visualized portions within the abdomen are unremarkable. Vascular/Lymphatic: Aortic atherosclerosis noted. No aneurysm. Enlarged retroperitoneal lymph nodes are identified. Periaortic node measures 1 cm, image number 1 of series 6. Aortocaval node measures 1 cm, image number 6 of series 6. Other:  Trace abdominal ascites.  Body wall edema noted. Musculoskeletal: Diffuse signal abnormality is identified. Multiple heterogeneous T2 hyperintense foci noted suspicious for diffuse bone metastases. Not well visualized on the CT images. IMPRESSION:  1. Innumerable lesions are identified throughout the liver which are highly suspicious for widespread metastases. Consider pathologic correlation with percutaneous ultrasound-guided liver biopsy. PET-CT may also be useful to complete radiographic staging. 2. Enlarged upper abdominal lymph nodes compatible with metastatic adenopathy. 3. Suspect diffuse skeletal metastases. Electronically Signed   By: Kerby Moors M.D.   On: 04/28/2017 09:54   Dg Chest Port 1 View  Result Date: 05/01/2017 CLINICAL DATA:  Shortness of breath. EXAM: PORTABLE CHEST 1 VIEW COMPARISON:  04/10/2017 FINDINGS: Sequelae of prior CABG are again identified. A feeding tube has been placed and courses into the duodenum with tip not imaged. The cardiomediastinal silhouette is unchanged. The lungs are hypoinflated with mild right and minimal left basilar  opacities consistent with atelectasis. No edema, sizable pleural effusion, or pneumothorax is identified. No acute osseous abnormality is seen. IMPRESSION: Low lung volumes with mild right and minimal left basilar atelectasis. Electronically Signed   By: Logan Bores M.D.   On: 05/01/2017 10:45   Dg Knee Complete 4 Views Right  Result Date: 04/07/2017 CLINICAL DATA:  Pain with recent history of falls. EXAM: RIGHT KNEE - COMPLETE 4+ VIEW COMPARISON:  None. FINDINGS: Vascular calcifications are identified. No fracture or joint effusion. No dislocation. IMPRESSION: Negative. Electronically Signed   By: Dorise Bullion III M.D   On: 04/07/2017 18:20   Dg Swallowing Func-speech Pathology  Result Date: 04/27/2017 Objective Swallowing Evaluation: Type of Study: MBS-Modified Barium Swallow Study Patient Details Name: EDGEL DEGNAN MRN: [id number] Date of Birth: [birth year] Today's Date: 04/27/2017 Time: SLP Start Time (ACUTE ONLY): 1014-SLP Stop Time (ACUTE ONLY): 1030 SLP Time Calculation (min) (ACUTE ONLY): 16 min Past Medical History: Past Medical History: Diagnosis Date . Arthritis  . Coronary artery disease  . Diabetes mellitus   insulin dependent . Dyslipidemia  . Hypertension  Past Surgical History: Past Surgical History: Procedure Laterality Date . CARDIAC CATHETERIZATION   . CORONARY ARTERY BYPASS GRAFT   . FLEXIBLE SIGMOIDOSCOPY Left 04/13/2017  Procedure: FLEXIBLE SIGMOIDOSCOPY;  Surgeon: Arta Silence, MD;  Location: [facility] ENDOSCOPY;  Service: Endoscopy;  Laterality: Left; . LEFT HEART CATHETERIZATION WITH CORONARY ANGIOGRAM N/A 09/15/2013  Procedure: LEFT HEART CATHETERIZATION WITH CORONARY ANGIOGRAM;  Surgeon: Clent Demark, MD;  Location: [place] CATH LAB;  Service: Cardiovascular;  Laterality: N/A; . open heart surgery  1998 . PERCUTANEOUS STENT INTERVENTION  09/15/2013  Procedure: PERCUTANEOUS STENT INTERVENTION;  Surgeon: Clent Demark, MD;  Location: [place] CATH LAB;  Service: Cardiovascular;; HPI:  76 year old male presented with weakness and confusion for 3 days.when he was admitted for SIADH (Syndrome of inappropriate antidiuretic hormone secretion. MBS 04/14/17 revealed penetration before the swallow and silent aspiration during (cued cough ineffective), intermittent trace penetration with nectar and honey, trace aspiration with nectar (compensatory strategies ineffective). Recommended regular and nectar thick liquids, free water protocol. C XR not repeated since 04/10/17 RIGHT basilar scarring without acute infiltrate.  Subjective: Alert, cooperative Assessment / Plan / Recommendation CHL IP CLINICAL IMPRESSIONS 04/27/2017 Clinical Impression   Results similar to [facility] 04/14/17 with addition of esophageal involvement noted during today's study. Decreased oral cohesion and control resulting in premature spill into laryngeal vestibule and aspiration before the swallow of thin barium producing with mild-moderate reflexive cough. Prolonged mastication of solid falling to vallecule and pt stating "I cannot swallow" and he would not attempt with multiple verbal cues/encouragement. Subseqeunt bolus of puree given, initiating swallow and transiting solid vallecular residue and puree mixture through UES. Esophageal scan revealed esophagus filled with barium  residual (did not appear to have effective peristalsis- MBS does not diagnose below the UES). Nectar thick barium offered for possbile improved esophageal transit with adament refusal and SLP giving max encouragement/explanations. Suspect main reason for his po refusal and statements of being unable to swallow. Recommend continue Dys 3 (pt would refuse puree according to son) and nectar thick. Will contact MD re: GI consult to further investigate esophageal function. Consider short term alternate feeding for nutrition. ST will follow.  SLP Visit Diagnosis Dysphagia, pharyngoesophageal phase (R13.14) Attention and concentration deficit following -- Frontal lobe and  executive function deficit following -- Impact on safety and function Severe aspiration risk   CHL IP TREATMENT RECOMMENDATION 04/27/2017 Treatment Recommendations Therapy as outlined in treatment plan below   Prognosis 04/27/2017 Prognosis for Safe Diet Advancement Fair Barriers to Reach Goals Cognitive deficits Barriers/Prognosis Comment -- CHL IP DIET RECOMMENDATION 04/27/2017 SLP Diet Recommendations Nectar thick liquid;Dysphagia 3 (Mech soft) solids Liquid Administration via Cup;No straw Medication Administration Crushed with puree Compensations Slow rate;Small sips/bites;Follow solids with liquid;Clear throat intermittently Postural Changes Remain semi-upright after after feeds/meals (Comment);Seated upright at 90 degrees   CHL IP OTHER RECOMMENDATIONS 04/27/2017 Recommended Consults Consider GI evaluation;Consider esophageal assessment Oral Care Recommendations Oral care BID Other Recommendations --   CHL IP FOLLOW UP RECOMMENDATIONS 04/27/2017 Follow up Recommendations Home health SLP   CHL IP FREQUENCY AND DURATION 04/27/2017 Speech Therapy Frequency (ACUTE ONLY) min 2x/week Treatment Duration 2 weeks      CHL IP ORAL PHASE 04/27/2017 Oral Phase Impaired Oral - Pudding Teaspoon -- Oral - Pudding Cup -- Oral - Honey Teaspoon -- Oral - Honey Cup -- Oral - Nectar Teaspoon -- Oral - Nectar Cup WFL Oral - Nectar Straw -- Oral - Thin Teaspoon -- Oral - Thin Cup Decreased bolus cohesion Oral - Thin Straw -- Oral - Puree -- Oral - Mech Soft -- Oral - Regular Delayed oral transit Oral - Multi-Consistency -- Oral - Pill -- Oral Phase - Comment --  CHL IP PHARYNGEAL PHASE 04/27/2017 Pharyngeal Phase Impaired Pharyngeal- Pudding Teaspoon -- Pharyngeal -- Pharyngeal- Pudding Cup -- Pharyngeal -- Pharyngeal- Honey Teaspoon -- Pharyngeal -- Pharyngeal- Honey Cup NT Pharyngeal -- Pharyngeal- Nectar Teaspoon -- Pharyngeal -- Pharyngeal- Nectar Cup Delayed swallow initiation-pyriform sinuses Pharyngeal -- Pharyngeal- Nectar Straw --  Pharyngeal -- Pharyngeal- Thin Teaspoon -- Pharyngeal -- Pharyngeal- Thin Cup Penetration/Aspiration before swallow;Pharyngeal residue - valleculae;Reduced epiglottic inversion Pharyngeal Material enters airway, passes BELOW cords and not ejected out despite cough attempt by patient Pharyngeal- Thin Straw NT Pharyngeal -- Pharyngeal- Puree WFL Pharyngeal -- Pharyngeal- Mechanical Soft -- Pharyngeal -- Pharyngeal- Regular Delayed swallow initiation-vallecula Pharyngeal -- Pharyngeal- Multi-consistency -- Pharyngeal -- Pharyngeal- Pill NT Pharyngeal -- Pharyngeal Comment --  CHL IP CERVICAL ESOPHAGEAL PHASE 04/27/2017 Cervical Esophageal Phase WFL Pudding Teaspoon -- Pudding Cup -- Honey Teaspoon -- Honey Cup -- Nectar Teaspoon -- Nectar Cup -- Nectar Straw -- Thin Teaspoon -- Thin Cup -- Thin Straw -- Puree -- Mechanical Soft -- Regular -- Multi-consistency -- Pill -- Cervical Esophageal Comment -- CHL IP GO 04/23/2013 Functional Assessment Tool Used clinical judgement Functional Limitations Swallowing Swallow Current Status (O3500) CK Swallow Goal Status (X3818) CK Swallow Discharge Status (E9937) CK Motor Speech Current Status (J6967) (None) Motor Speech Goal Status (E9381) (None) Motor Speech Goal Status (O1751) (None) Spoken Language Comprehension Current Status (W2585) (None) Spoken Language Comprehension Goal Status (I7782) (None) Spoken Language Comprehension Discharge Status (U2353) (None) Spoken Language Expression Current Status (I1443) (None) Spoken Language Expression  Goal Status [phone]) (None) Spoken Language Expression Discharge Status [phone]) (None) Attention Current Status [phone]) (None) Attention Goal Status (F5732) (None) Attention Discharge Status [phone] (None) Memory Current Status (Y7062) (None) Memory Goal Status (B7628) (None) Memory Discharge Status (B1517) (None) Voice Current Status (O1607) (None) Voice Goal Status (P7106) (None) Voice Discharge Status (Y6948) (None) Other Speech-Language  Pathology Functional Limitation Current Status (N4627) (None) Other Speech-Language Pathology Functional Limitation Goal Status (O3500) (None) Other Speech-Language Pathology Functional Limitation Discharge Status [phone]) (None) Houston Siren 04/27/2017, 3:26 PM [name] Colvin Caroli.Ed CCC-SLP Pager [phone]              Dg Swallowing Func-speech Pathology  Result Date: 04/14/2017 Objective Swallowing Evaluation: Type of Study: Bedside Swallow Evaluation Patient Details Name: Calvin Perez MRN: [id number] Date of Birth: 1941/01/07 Today's Date: 04/14/2017 Time: SLP Start Time (ACUTE ONLY): 1105-SLP Stop Time (ACUTE ONLY): 1120 SLP Time Calculation (min) (ACUTE ONLY): 15 min Past Medical History: Past Medical History: Diagnosis Date . Arthritis  . Coronary artery disease  . Diabetes mellitus   insulin dependent . Dyslipidemia  . Hypertension  Past Surgical History: Past Surgical History: Procedure Laterality Date . CARDIAC CATHETERIZATION   . CORONARY ARTERY BYPASS GRAFT   . LEFT HEART CATHETERIZATION WITH CORONARY ANGIOGRAM N/A 09/15/2013  Procedure: LEFT HEART CATHETERIZATION WITH CORONARY ANGIOGRAM;  Surgeon: Clent Demark, MD;  Location: [facility];  Service: Cardiovascular;  Laterality: N/A; . open heart surgery  1998 . PERCUTANEOUS STENT INTERVENTION  09/15/2013  Procedure: PERCUTANEOUS STENT INTERVENTION;  Surgeon: Clent Demark, MD;  Location: [facility];  Service: Cardiovascular;; HPI: CATO LIBURD a 76 y.o.maleadmitted with confusion, hyponatremia. Found to have anemia and CT showed lesion in rectum +/- bladder with adenopathy worrisome for locally expansive malignancy. Pt has history of CVA with resultant dysarthria, dysphagia, right sided weakness in 2014, as well as pontine infarct in 2013 per records. Seen for outpatient MBS 04/2013 with findings of a moderate sensory motor oropharyngeal dysphagia with silent aspiration of thin liquids. 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 Pt was recommended to consume nectar thick liquids with regular solids with moderate aspiration risk. SLP recommended f/u in OP for dysphagia and dysarthria, as well as ENT referral to evaluate vocal cord function. CT 04/07/17 reveals no acute infarction, CXR 04/10/17 revealed right basilar scarring without acute infiltrate. Subjective: Alert, cooperative Assessment / Plan / Recommendation CHL IP CLINICAL IMPRESSIONS 04/14/2017 Clinical Impression Patient presents with moderate sensory motor pharyngeal dysphagia with silent aspiration of thin liquids. Swallowing function appears consistent with performance on MBS performed in 2014. With 25% of trials, pt penetrated thin liquids before the swallow with subsequent silent aspiration during the swallow. Even with significant aspiration, pt did not sense airway compromise, and cued cough was ineffective for airway clearance. 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 With nectar and honey-thick liquids, pt with intermittent trace penetration. Compensatory manuevers including bolus hold, chin tuck ineffective for airway protection. Trace aspiration x1 of nectar thick liquid with barium tablet, due to reduced coordination with mixed consistency. Spoke with pt's daughter re: findings via telephone and discussed pt's risks given known silent aspiration. Per her report, pt has been consuming thin liquids and has not had pneumonia. However, she expresses desire to adhere to conservative recommendation of nectar-thick liquids with regular solids during this acute hospitalization. SLP provided education re:  free water protocol. Recommend pt consume nectar-thick liquids with regular solids, medications whole in puree. Pt may have thin water between meals only within 30 minutes of oral care. Patient may benefit from ENT  referral to assess suspected vocal fold impairment, as well as home health SLP f/u for dysphagia.  SLP Visit Diagnosis Dysphagia, pharyngeal phase (R13.13) Attention and concentration deficit following -- Frontal lobe and executive function deficit following -- Impact on safety and function Moderate aspiration risk   CHL IP TREATMENT RECOMMENDATION 04/14/2017 Treatment Recommendations Therapy as outlined in treatment plan below   Prognosis 04/14/2017 Prognosis for Safe Diet Advancement Fair Barriers to Reach Goals Cognitive deficits;Time post onset Barriers/Prognosis Comment -- CHL IP DIET RECOMMENDATION 04/14/2017 SLP Diet Recommendations Regular solids;Nectar thick liquid;Free water protocol after oral care Liquid Administration via Cup Medication Administration Whole meds with puree Compensations Slow rate;Small sips/bites Postural Changes Seated upright at 90 degrees   CHL IP OTHER RECOMMENDATIONS 04/14/2017 Recommended Consults Consider ENT evaluation Oral Care Recommendations Oral care BID Other Recommendations Order thickener from pharmacy   CHL IP FOLLOW UP RECOMMENDATIONS 04/14/2017 Follow up Recommendations Home health SLP   CHL IP FREQUENCY AND DURATION 04/14/2017 Speech Therapy Frequency (ACUTE ONLY) min 2x/week Treatment Duration 2 weeks      CHL IP ORAL PHASE 04/14/2017 Oral Phase WFL Oral - Pudding Teaspoon -- Oral - Pudding Cup -- Oral - Honey Teaspoon -- Oral - Honey Cup -- Oral - Nectar Teaspoon -- Oral - Nectar Cup -- Oral - Nectar Straw -- Oral - Thin Teaspoon -- Oral - Thin Cup -- Oral - Thin Straw -- Oral - Puree -- Oral - Mech Soft -- Oral - Regular -- Oral - Multi-Consistency -- Oral - Pill -- Oral Phase - Comment --  CHL IP PHARYNGEAL PHASE 04/14/2017 Pharyngeal Phase Impaired Pharyngeal- Pudding Teaspoon -- Pharyngeal -- Pharyngeal- Pudding Cup -- Pharyngeal -- Pharyngeal- Honey Teaspoon -- Pharyngeal -- Pharyngeal- Honey Cup Delayed swallow initiation-vallecula;Delayed swallow initiation-pyriform  sinuses;Reduced airway/laryngeal closure;Penetration/Aspiration during swallow Pharyngeal Material enters airway, remains ABOVE vocal cords and not ejected out Pharyngeal- Nectar Teaspoon -- Pharyngeal -- Pharyngeal- Nectar Cup Delayed swallow initiation-vallecula;Delayed swallow initiation-pyriform sinuses;Reduced airway/laryngeal closure;Penetration/Aspiration during swallow Pharyngeal Material enters airway, remains ABOVE vocal cords and not ejected out Pharyngeal- Nectar Straw -- Pharyngeal -- Pharyngeal- Thin Teaspoon -- Pharyngeal -- Pharyngeal- Thin Cup Delayed swallow initiation-pyriform sinuses;Delayed swallow initiation-vallecula;Reduced airway/laryngeal closure;Penetration/Aspiration during swallow;Penetration/Aspiration before swallow;Moderate aspiration;Trace aspiration Pharyngeal Material enters airway, passes BELOW cords without attempt by patient to eject out (silent aspiration) Pharyngeal- Thin Straw Delayed swallow initiation-pyriform sinuses;Reduced airway/laryngeal closure;Trace aspiration;Penetration/Aspiration during swallow Pharyngeal Material enters airway, passes BELOW cords without attempt by patient to eject out (silent aspiration) Pharyngeal- Puree WFL Pharyngeal -- Pharyngeal- Mechanical Soft -- Pharyngeal -- Pharyngeal- Regular WFL Pharyngeal -- Pharyngeal- Multi-consistency -- Pharyngeal -- Pharyngeal- Pill Delayed swallow initiation-pyriform sinuses;Pharyngeal residue - valleculae;Penetration/Aspiration during swallow;Trace aspiration;Reduced airway/laryngeal closure Pharyngeal Material enters airway, passes BELOW cords without attempt by patient to eject out (silent aspiration) Pharyngeal Comment --  CHL IP CERVICAL ESOPHAGEAL PHASE 04/14/2017 Cervical Esophageal Phase WFL Pudding Teaspoon -- Pudding Cup -- Honey Teaspoon -- Honey Cup -- Nectar Teaspoon -- Nectar Cup -- Nectar Straw -- Thin Teaspoon -- Thin Cup -- Thin Straw -- Puree -- Mechanical Soft -- Regular -- Multi-consistency  -- Pill -- Cervical Esophageal Comment Bony protrusion C4-5 does not appear to impact swallow function CHL IP GO 04/23/2013 Functional Assessment Tool Used clinical judgement Functional Limitations Swallowing Swallow Current Status (I7867) CK Swallow Goal Status (  E3154) CK Swallow Discharge Status [phone]) CK Motor Speech Current Status [phone]) (None) Motor Speech Goal Status [phone]) (None) Motor Speech Goal Status [phone]) (None) Spoken Language Comprehension Current Status [phone]) (None) Spoken Language Comprehension Goal Status (J8250) (None) Spoken Language Comprehension Discharge Status [phone]) (None) Spoken Language Expression Current Status [phone]) (None) Spoken Language Expression Goal Status [phone]) (None) Spoken Language Expression Discharge Status [phone]) (None) Attention Current Status (Z3299) (None) Attention Goal Status (M4268) (None) Attention Discharge Status [phone]) (None) Memory Current Status (Q2297) (None) Memory Goal Status (L8921) (None) Memory Discharge Status (J9417) (None) Voice Current Status (E0814) (None) Voice Goal Status (G8185) (None) Voice Discharge Status (U3149) (None) Other Speech-Language Pathology Functional Limitation Current Status (F0263) (None) Other Speech-Language Pathology Functional Limitation Goal Status (Z8588) (None) Other Speech-Language Pathology Functional Limitation Discharge Status [phone]) (None) [name] 04/14/2017, 1:53 PM [name], [place], [place] Speech-Language Pathologist [phone]              Microbiology No results found for this or any previous visit (from the past 240 hour(s)).  Lab Basic Metabolic Panel:  Recent Labs Lab 04/27/17 0554 04/28/17 0427 04/29/17 0417 04/30/17 0532 05/01/17 1103  NA 136 139 140 143 145  K 4.1 3.7 3.8 4.0 4.1  CL 106 108 107 108 109  CO2 23 23 25 26 23   GLUCOSE 149* 137* 251* 253* 447*  BUN 6 7 9 11  23*  CREATININE 0.89 0.97 1.00 1.01 1.54*  CALCIUM 9.1 9.3 9.4 9.6 10.2   Liver Function  Tests:  Recent Labs Lab 04/28/17 0427 05/01/17 1103  AST 207* 763*  ALT 145* 309*  ALKPHOS 328* 575*  BILITOT 2.6* 6.1*  PROT 6.8 6.9  ALBUMIN 2.6* 2.4*   No results for input(s): LIPASE, AMYLASE in the last 168 hours.  Recent Labs Lab 05/01/17 1103  AMMONIA 52*   CBC:  Recent Labs Lab 05/01/17 1103  WBC 10.6*  NEUTROABS 7.8*  HGB 11.3*  HCT 34.9*  MCV 81.7  PLT 107*   Cardiac Enzymes: No results for input(s): CKTOTAL, CKMB, CKMBINDEX, TROPONINI in the last 168 hours. Sepsis Labs:  Recent Labs Lab 05/01/17 1103  WBC 10.6*    Procedures/Operations  DG Swallowing function MRI, MRA brain MR liver Chest x-ray   [facility] 05-25-17, 7:56 AM

## 2017-06-02 NOTE — Progress Notes (Signed)
This RN witnessed Michaelle Copas waste 2104mL of morphine after patient passed away.

## 2017-06-02 DEATH — deceased

## 2018-09-21 IMAGING — CT CT HEAD W/O CM
3 of 4 series · 13 of 47 positions shown, 15 images · non-contrast
Comparison: Brain MRI February 01, 2012; brain MRI January 14, 2009; head
CT January 13, 2009

CLINICAL DATA: Right lower extremity numbness and tingling

EXAM:
CT HEAD WITHOUT CONTRAST
TECHNIQUE: Contiguous axial images were obtained from the base of the skull
through the vertex without intravenous contrast.

[Series 3: head wo · axial · 0.46mm/px · z∈[-104,+12]mm · 7 of 31 slices shown, 9 images]
[im 4/31  brain]
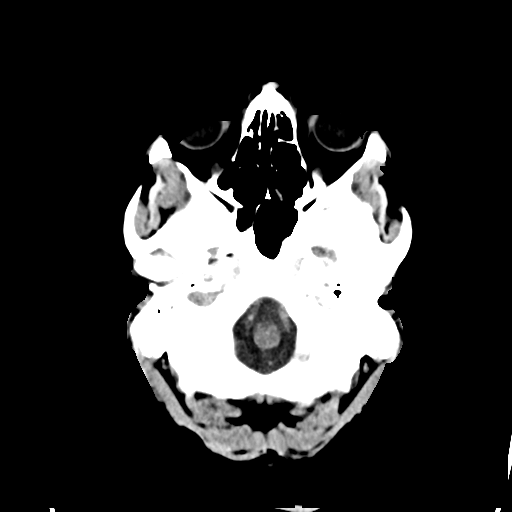
[im 4/31  bone]
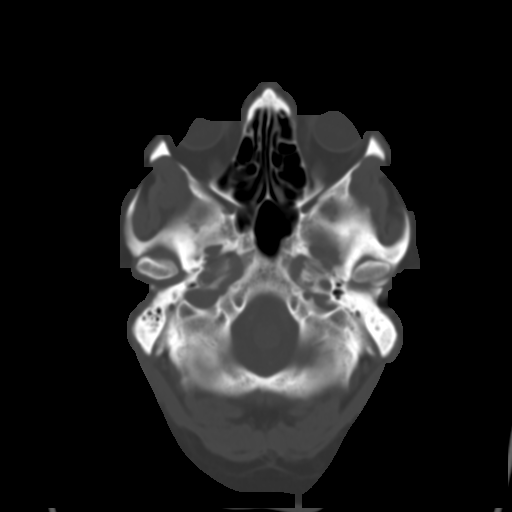
[im 8/31  brain]
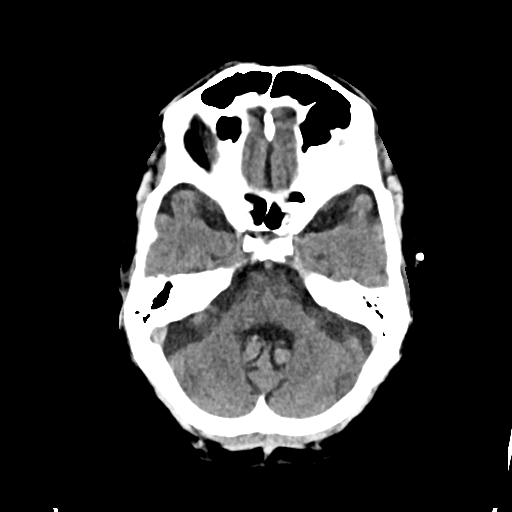
[im 12/31  brain]
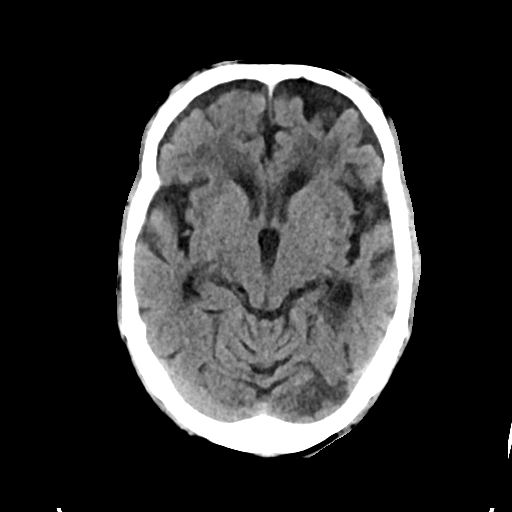
[im 16/31  brain]
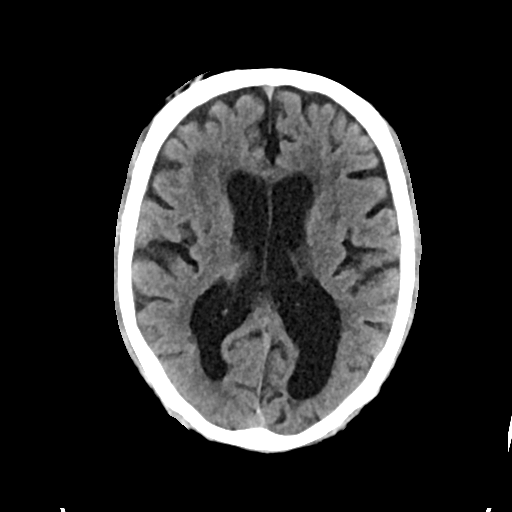
[im 19/31  brain]
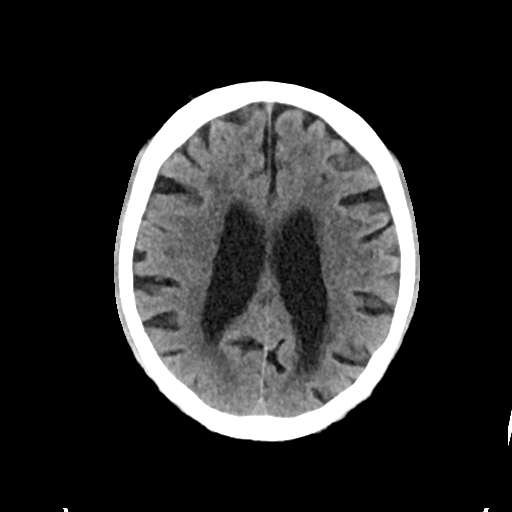
[im 19/31  bone]
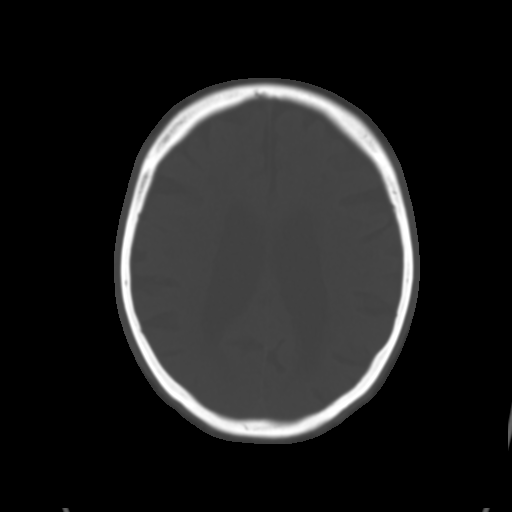
[im 23/31  brain]
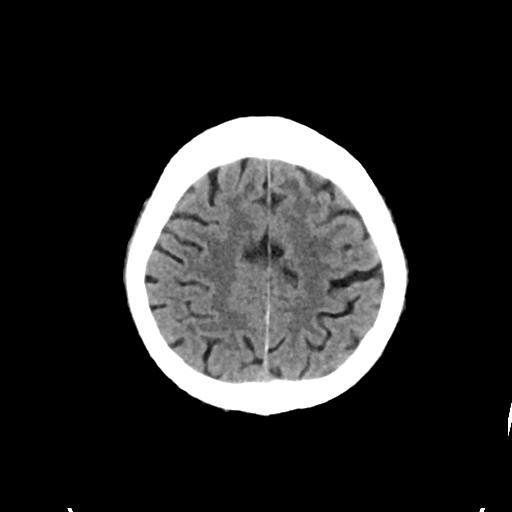
[im 27/31  brain]
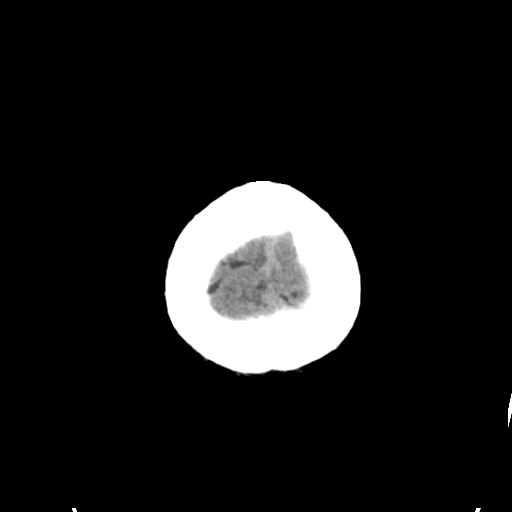

[Series 5: cor soft · coronal · 0.30mm/px · 3 of 70 slices shown]
[im 24/70  brain]
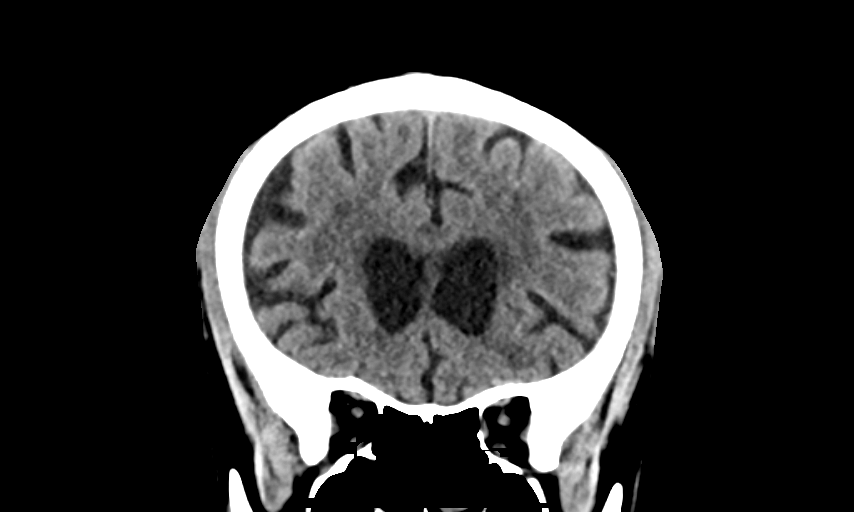
[im 31/70  brain]
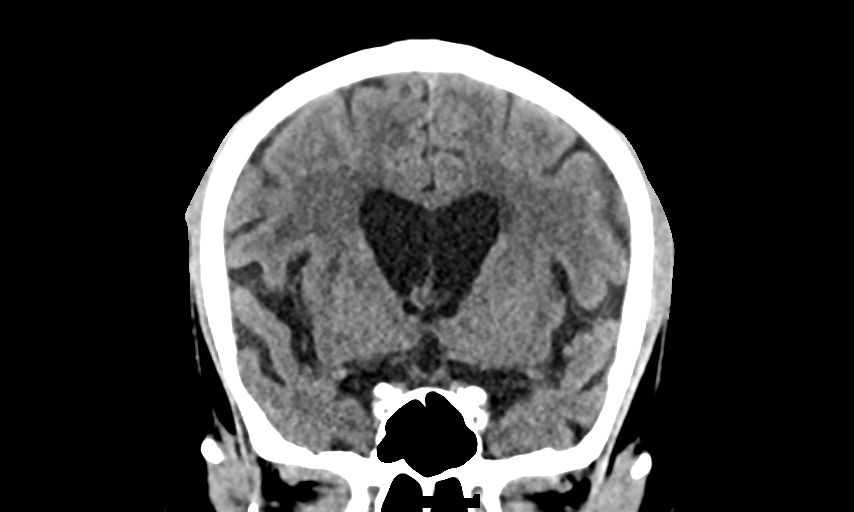
[im 39/70  brain]
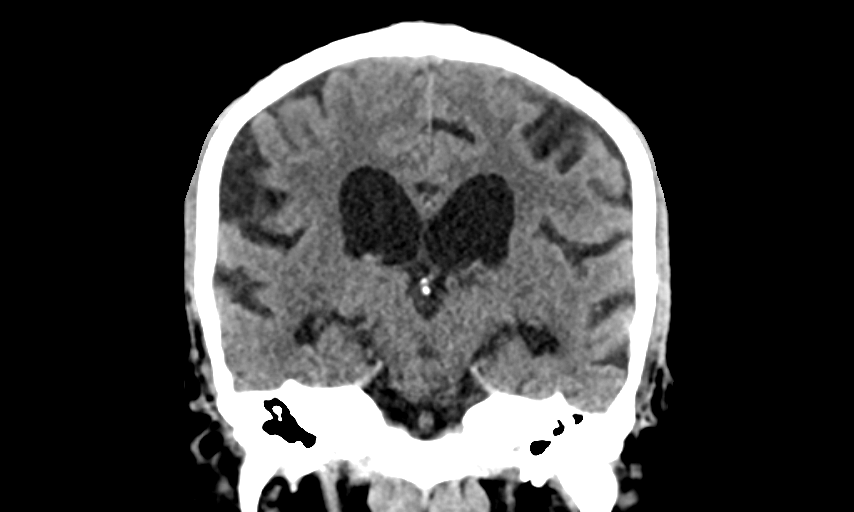

[Series 6: sag soft · sagittal · 0.30mm/px · 3 of 67 slices shown]
[im 23/67  brain]
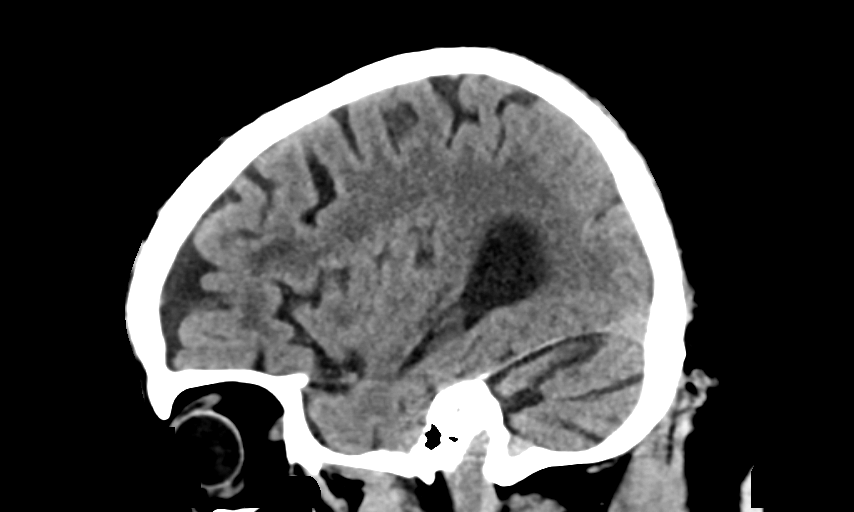
[im 34/67  brain]
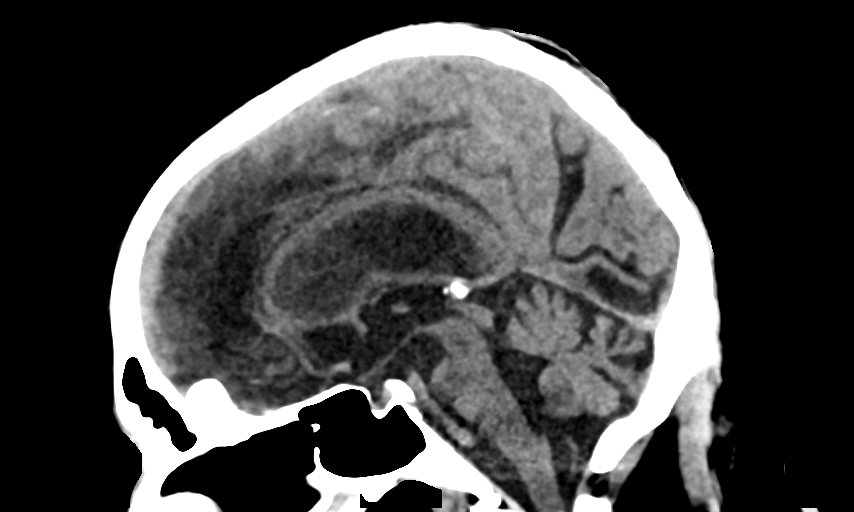
[im 45/67  brain]
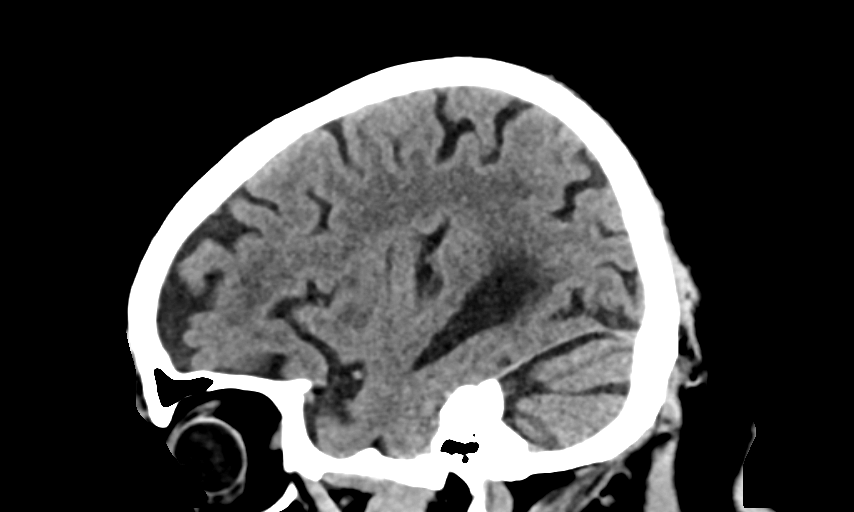

[13 of 47 positions shown; findings below may reference images not displayed]

FINDINGS: Brain: There is moderate diffuse atrophy. There is no apparent
intracranial mass, hemorrhage, extra-axial fluid collection, or
midline shift. There is evidence of what appears to be prior infarct
in the left medial occipital lobe, not present on most recent
available studies. There is evidence of a prior infarct in the right
thalamus. A smaller infarct is noted in the left thalamus.
Elsewhere, there is small vessel disease throughout the centra
semiovale bilaterally. There is no appreciable acute infarct on the
current examination.

Vascular: There is no appreciable hyperdense vessel. There is
calcification in each carotid siphon. There is also calcification in
each distal vertebral artery.

Skull: The bony calvarium appears intact.

Sinuses/Orbits: There is mucosal thickening in multiple ethmoid air
cells bilaterally. Visualized paranasal sinuses elsewhere clear.
Orbits appear symmetric bilaterally.

Other: There is chronic mastoid air cell thickening bilaterally.
IMPRESSION: Atrophy with periventricular small vessel disease. Old appearing
infarct in the medial left occipital lobe, not present on most
recent prior studies. Prior appearing small infarcts in each
thalamus. No acute infarct is demonstrable by CT. No mass,
hemorrhage, or extra-axial fluid collection.

There are multiple foci of arterial vascular calcification. There is
ethmoid sinus disease bilaterally. There is chronic mastoid air cell
thickening bilaterally.

## 2018-09-28 IMAGING — RF DG SWALLOWING FUNCTION - NRPT [facility]
1 series · 18 of 24 positions shown · non-contrast
Comparison: none

[Series 1: run · 39 acquisitions, 18 frames shown]
[im 1/39]
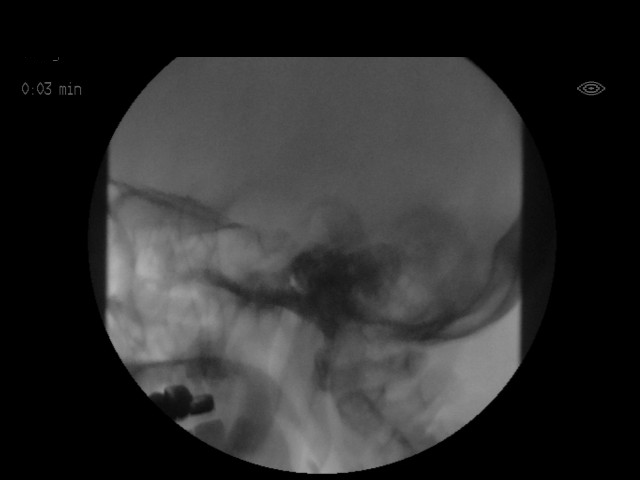
[im 4/39]
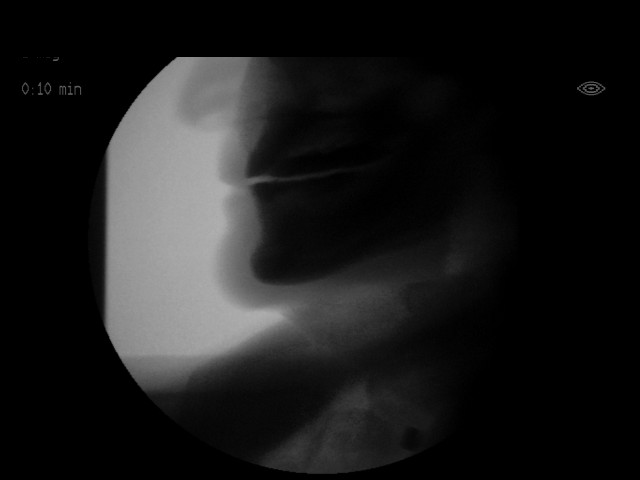
[im 5/39]
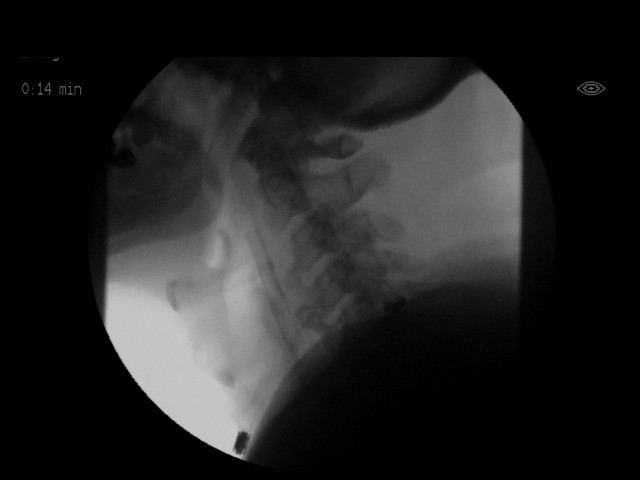
[im 7/39]
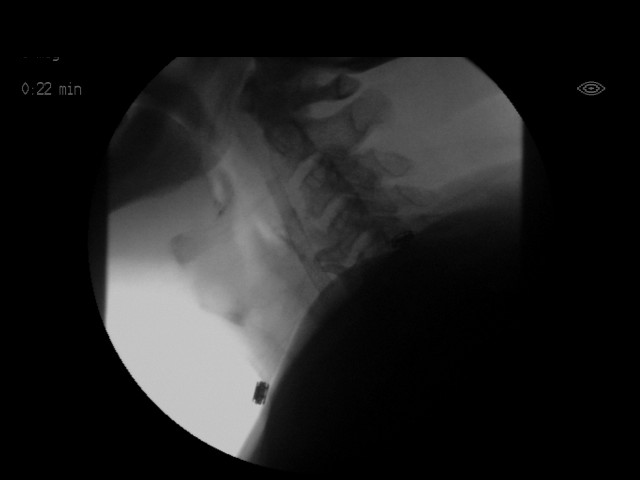
[im 10/39]
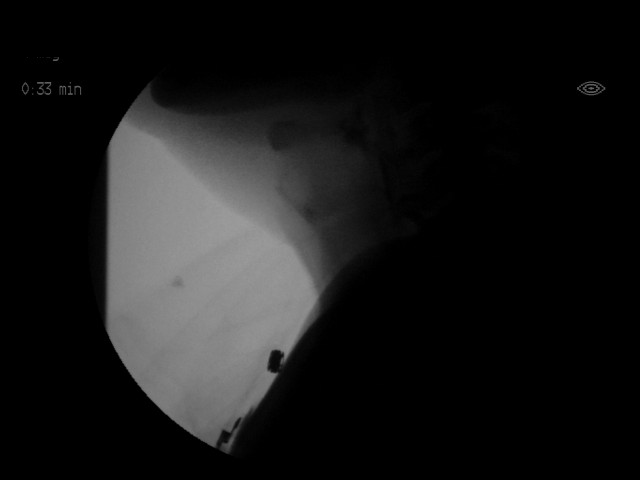
[im 12/39]
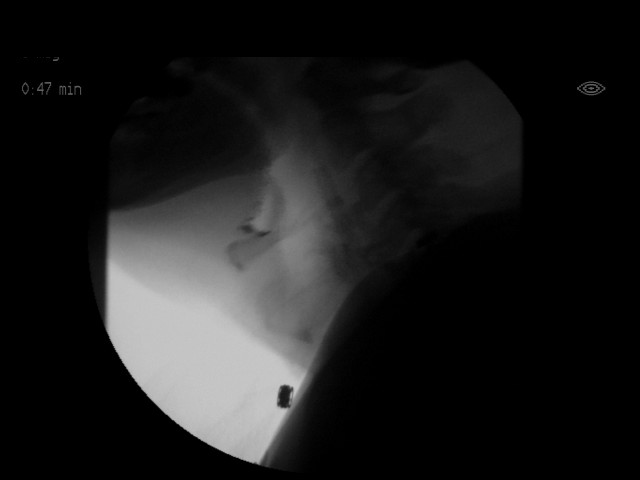
[im 14/39]
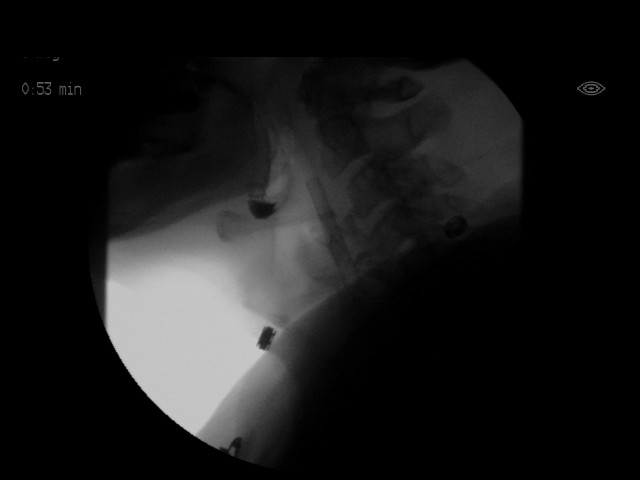
[im 17/39]
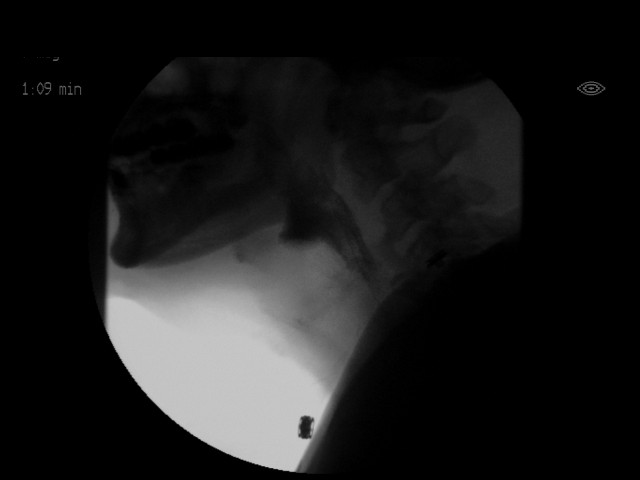
[im 19/39]
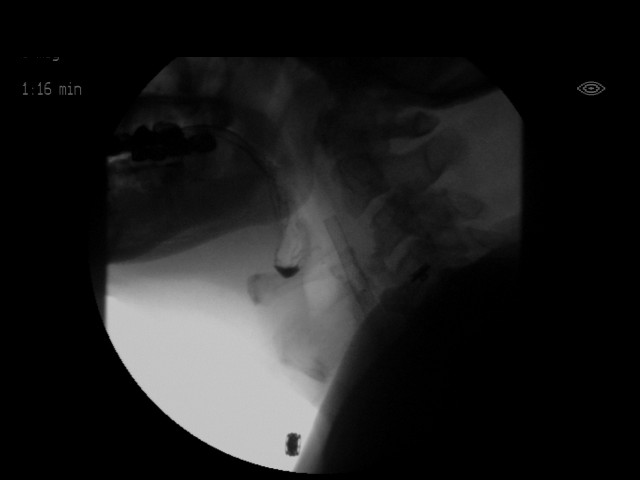
[im 20/39]
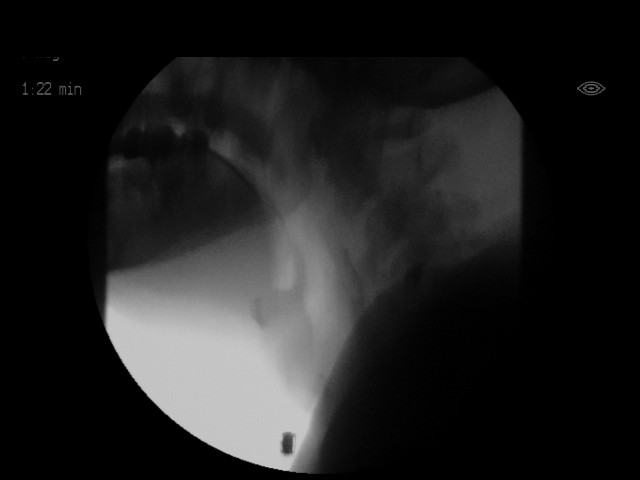
[im 24/39]
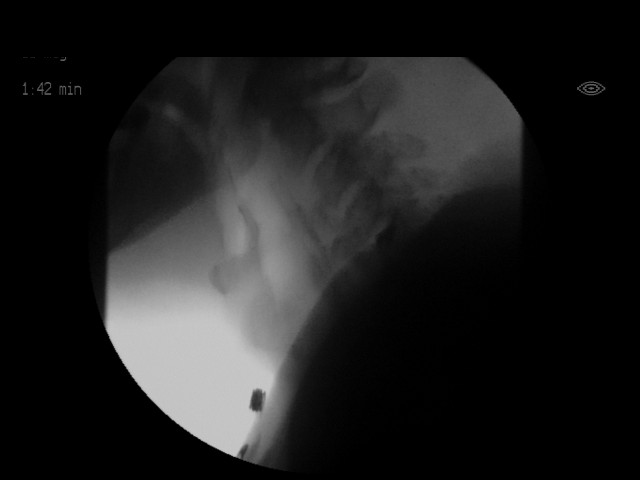
[im 25/39]
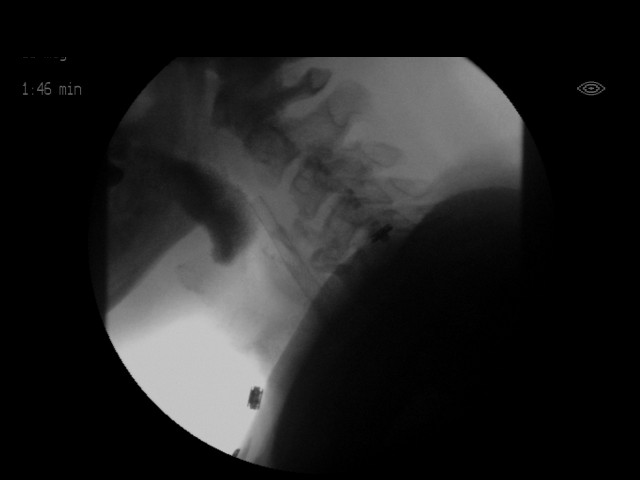
[im 27/39]
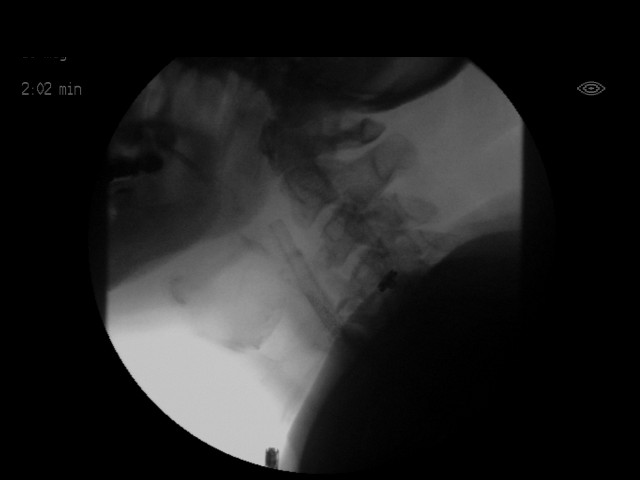
[im 30/39]
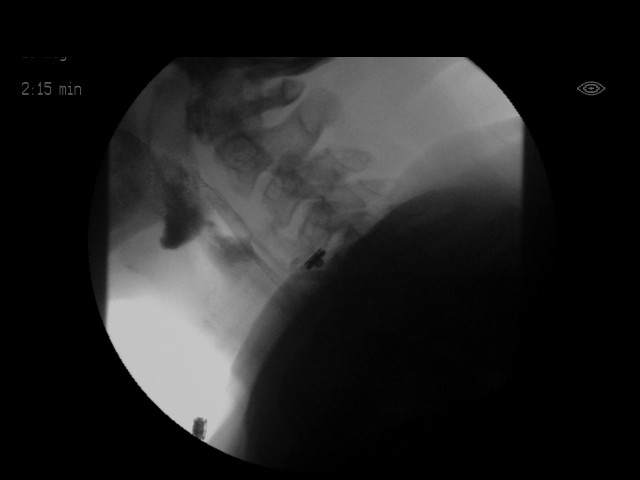
[im 32/39]
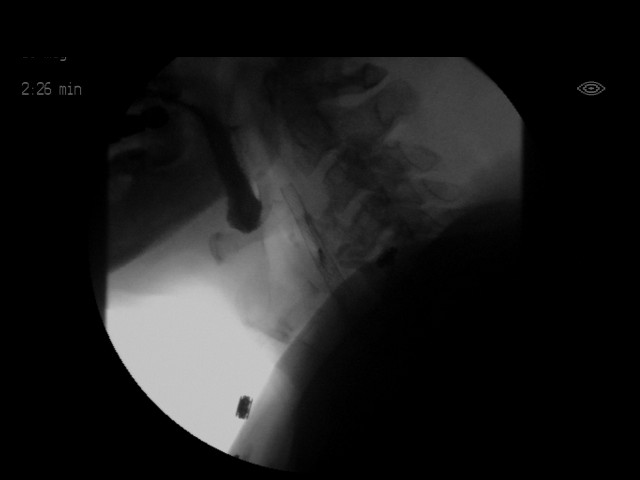
[im 34/39]
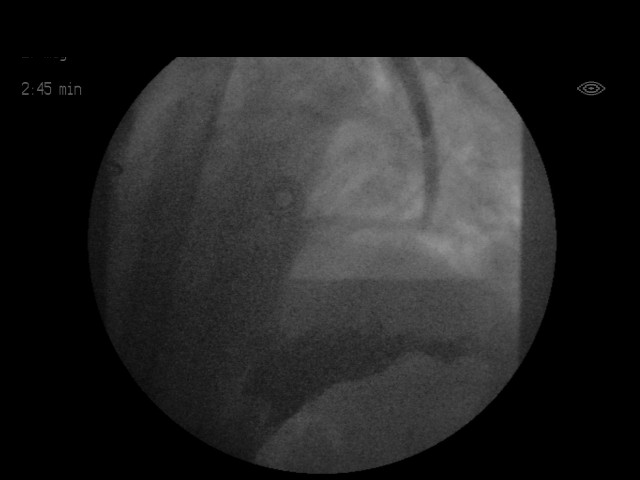
[im 37/39]
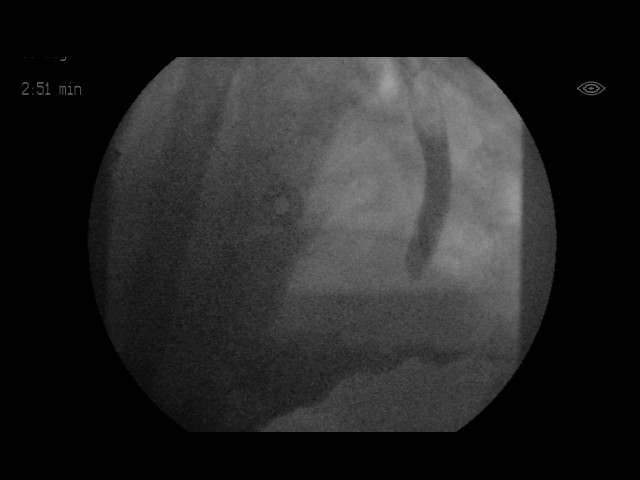
[im 39/39]
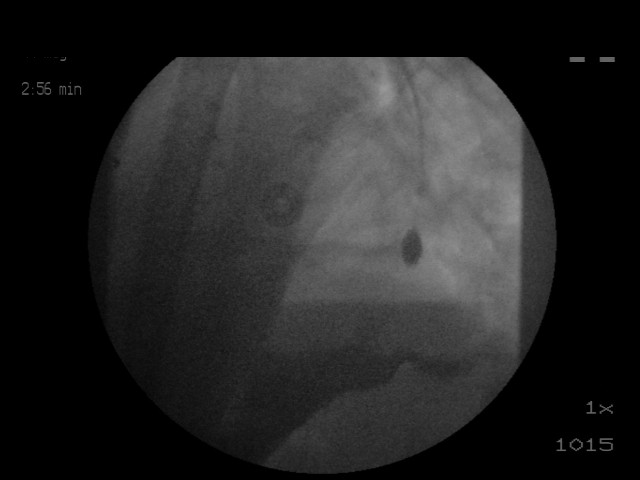

[18 of 24 positions shown; findings below may reference images not displayed]

FLUOROSCOPY FOR SWALLOWING FUNCTION STUDY:
Fluoroscopy was provided for swallowing function study, which was administered by a speech pathologist.  Final results and recommendations from this study are contained within the speech pathology report.
